# Patient Record
Sex: Female | Born: 1950 | Race: White | Hispanic: No | Smoking: Never smoker
Health system: Southern US, Community
[De-identification: ages and names within clinical notes are randomized; demographics above are authoritative.]

## PROBLEM LIST (undated history)

## (undated) DIAGNOSIS — G43909 Migraine, unspecified, not intractable, without status migrainosus: Secondary | ICD-10-CM

## (undated) DIAGNOSIS — R112 Nausea with vomiting, unspecified: Secondary | ICD-10-CM

## (undated) DIAGNOSIS — Z973 Presence of spectacles and contact lenses: Secondary | ICD-10-CM

## (undated) DIAGNOSIS — S21001A Unspecified open wound of right breast, initial encounter: Secondary | ICD-10-CM

## (undated) DIAGNOSIS — N200 Calculus of kidney: Secondary | ICD-10-CM

## (undated) DIAGNOSIS — F419 Anxiety disorder, unspecified: Secondary | ICD-10-CM

## (undated) DIAGNOSIS — H5213 Myopia, bilateral: Secondary | ICD-10-CM

## (undated) DIAGNOSIS — E890 Postprocedural hypothyroidism: Secondary | ICD-10-CM

## (undated) DIAGNOSIS — C50919 Malignant neoplasm of unspecified site of unspecified female breast: Secondary | ICD-10-CM

## (undated) DIAGNOSIS — F329 Major depressive disorder, single episode, unspecified: Secondary | ICD-10-CM

## (undated) DIAGNOSIS — Z87442 Personal history of urinary calculi: Secondary | ICD-10-CM

## (undated) DIAGNOSIS — K219 Gastro-esophageal reflux disease without esophagitis: Secondary | ICD-10-CM

## (undated) DIAGNOSIS — F32A Depression, unspecified: Secondary | ICD-10-CM

## (undated) DIAGNOSIS — Z9889 Other specified postprocedural states: Secondary | ICD-10-CM

## (undated) DIAGNOSIS — I10 Essential (primary) hypertension: Secondary | ICD-10-CM

## (undated) DIAGNOSIS — Z972 Presence of dental prosthetic device (complete) (partial): Secondary | ICD-10-CM

## (undated) DIAGNOSIS — M1990 Unspecified osteoarthritis, unspecified site: Secondary | ICD-10-CM

## (undated) HISTORY — PX: CARPAL TUNNEL RELEASE: SHX101

## (undated) HISTORY — PX: OTHER SURGICAL HISTORY: SHX169

## (undated) HISTORY — PX: REDUCTION MAMMAPLASTY: SUR839

## (undated) HISTORY — PX: RETINAL DETACHMENT SURGERY: SHX105

---

## 1970-01-25 HISTORY — PX: PARTIAL NEPHRECTOMY: SHX414

## 2005-01-25 HISTORY — PX: CHOLECYSTECTOMY: SHX55

## 2006-01-25 HISTORY — PX: LUMBAR FUSION: SHX111

## 2010-01-25 HISTORY — PX: THYROIDECTOMY: SHX17

## 2011-01-26 HISTORY — PX: SHOULDER ARTHROSCOPY: SHX128

## 2011-07-21 DIAGNOSIS — I10 Essential (primary) hypertension: Secondary | ICD-10-CM | POA: Insufficient documentation

## 2011-07-21 DIAGNOSIS — N200 Calculus of kidney: Secondary | ICD-10-CM | POA: Insufficient documentation

## 2011-07-21 DIAGNOSIS — K219 Gastro-esophageal reflux disease without esophagitis: Secondary | ICD-10-CM | POA: Insufficient documentation

## 2011-12-27 DIAGNOSIS — R82991 Hypocitraturia: Secondary | ICD-10-CM | POA: Insufficient documentation

## 2013-12-10 DIAGNOSIS — H5213 Myopia, bilateral: Secondary | ICD-10-CM | POA: Insufficient documentation

## 2013-12-10 DIAGNOSIS — H33001 Unspecified retinal detachment with retinal break, right eye: Secondary | ICD-10-CM | POA: Insufficient documentation

## 2015-04-14 ENCOUNTER — Other Ambulatory Visit: Payer: Self-pay | Admitting: *Deleted

## 2015-04-14 DIAGNOSIS — R928 Other abnormal and inconclusive findings on diagnostic imaging of breast: Secondary | ICD-10-CM

## 2015-04-22 ENCOUNTER — Ambulatory Visit
Admission: RE | Admit: 2015-04-22 | Discharge: 2015-04-22 | Disposition: A | Discharge status: Home / Self Care | Payer: BLUE CROSS/BLUE SHIELD | Source: Ambulatory Visit | Attending: *Deleted | Admitting: *Deleted

## 2015-04-22 ENCOUNTER — Other Ambulatory Visit: Payer: Self-pay | Admitting: *Deleted

## 2015-04-22 DIAGNOSIS — R928 Other abnormal and inconclusive findings on diagnostic imaging of breast: Secondary | ICD-10-CM

## 2015-05-01 ENCOUNTER — Other Ambulatory Visit: Payer: Self-pay | Admitting: General Surgery

## 2015-05-01 DIAGNOSIS — R928 Other abnormal and inconclusive findings on diagnostic imaging of breast: Secondary | ICD-10-CM

## 2015-05-05 ENCOUNTER — Ambulatory Visit
Admission: RE | Admit: 2015-05-05 | Discharge: 2015-05-05 | Disposition: A | Discharge status: Home / Self Care | Payer: BLUE CROSS/BLUE SHIELD | Source: Ambulatory Visit | Attending: General Surgery | Admitting: General Surgery

## 2015-05-05 ENCOUNTER — Ambulatory Visit
Admission: RE | Admit: 2015-05-05 | Discharge: 2015-05-05 | Disposition: A | Discharge status: Home / Self Care | Payer: BLUE CROSS/BLUE SHIELD | Source: Ambulatory Visit | Attending: *Deleted | Admitting: *Deleted

## 2015-05-05 DIAGNOSIS — R928 Other abnormal and inconclusive findings on diagnostic imaging of breast: Secondary | ICD-10-CM

## 2015-05-06 ENCOUNTER — Other Ambulatory Visit: Payer: Self-pay | Admitting: General Surgery

## 2015-05-06 DIAGNOSIS — C50912 Malignant neoplasm of unspecified site of left female breast: Secondary | ICD-10-CM

## 2015-05-13 ENCOUNTER — Ambulatory Visit
Admission: RE | Admit: 2015-05-13 | Discharge: 2015-05-13 | Disposition: A | Discharge status: Home / Self Care | Payer: BLUE CROSS/BLUE SHIELD | Source: Ambulatory Visit | Attending: General Surgery | Admitting: General Surgery

## 2015-05-13 ENCOUNTER — Ambulatory Visit: Payer: Self-pay | Admitting: General Surgery

## 2015-05-13 DIAGNOSIS — C50912 Malignant neoplasm of unspecified site of left female breast: Secondary | ICD-10-CM

## 2015-05-15 ENCOUNTER — Other Ambulatory Visit: Payer: Self-pay | Admitting: General Surgery

## 2015-05-15 DIAGNOSIS — C50912 Malignant neoplasm of unspecified site of left female breast: Secondary | ICD-10-CM

## 2015-05-23 ENCOUNTER — Other Ambulatory Visit: Payer: Self-pay | Admitting: General Surgery

## 2015-05-23 DIAGNOSIS — C50912 Malignant neoplasm of unspecified site of left female breast: Secondary | ICD-10-CM

## 2015-05-27 ENCOUNTER — Ambulatory Visit
Admission: RE | Admit: 2015-05-27 | Discharge: 2015-05-27 | Disposition: A | Discharge status: Home / Self Care | Payer: BLUE CROSS/BLUE SHIELD | Source: Ambulatory Visit | Attending: General Surgery | Admitting: General Surgery

## 2015-05-27 DIAGNOSIS — C50912 Malignant neoplasm of unspecified site of left female breast: Secondary | ICD-10-CM

## 2015-05-27 MED ORDER — GADOBENATE DIMEGLUMINE 529 MG/ML IV SOLN: 20.0000 mL | Freq: Once | INTRAVENOUS | Status: DC | PRN

## 2015-05-27 MED ORDER — GADOBENATE DIMEGLUMINE 529 MG/ML IV SOLN
20.0000 mL | Freq: Once | INTRAVENOUS | Status: AC | PRN
  Administered 2015-05-27: 20 mL via INTRAVENOUS

## 2015-05-28 ENCOUNTER — Other Ambulatory Visit: Payer: Self-pay | Admitting: General Surgery

## 2015-05-28 DIAGNOSIS — Z905 Acquired absence of kidney: Secondary | ICD-10-CM | POA: Insufficient documentation

## 2015-05-28 DIAGNOSIS — R928 Other abnormal and inconclusive findings on diagnostic imaging of breast: Secondary | ICD-10-CM

## 2015-05-28 DIAGNOSIS — Z87442 Personal history of urinary calculi: Secondary | ICD-10-CM | POA: Insufficient documentation

## 2015-05-28 DIAGNOSIS — C50912 Malignant neoplasm of unspecified site of left female breast: Secondary | ICD-10-CM

## 2015-06-03 ENCOUNTER — Ambulatory Visit
Admission: RE | Admit: 2015-06-03 | Discharge: 2015-06-03 | Disposition: A | Discharge status: Home / Self Care | Payer: BLUE CROSS/BLUE SHIELD | Source: Ambulatory Visit | Attending: General Surgery | Admitting: General Surgery

## 2015-06-03 DIAGNOSIS — C50912 Malignant neoplasm of unspecified site of left female breast: Secondary | ICD-10-CM

## 2015-06-03 DIAGNOSIS — R928 Other abnormal and inconclusive findings on diagnostic imaging of breast: Secondary | ICD-10-CM

## 2015-06-03 MED ORDER — GADOBENATE DIMEGLUMINE 529 MG/ML IV SOLN
20.0000 mL | Freq: Once | INTRAVENOUS | Status: AC | PRN
Start: 2015-06-03 — End: 2015-06-03
  Administered 2015-06-03: 20 mL via INTRAVENOUS

## 2015-06-05 ENCOUNTER — Encounter (HOSPITAL_BASED_OUTPATIENT_CLINIC_OR_DEPARTMENT_OTHER): Payer: Self-pay | Admitting: *Deleted

## 2015-06-06 ENCOUNTER — Ambulatory Visit: Payer: Self-pay | Admitting: General Surgery

## 2015-06-06 DIAGNOSIS — C50912 Malignant neoplasm of unspecified site of left female breast: Secondary | ICD-10-CM

## 2015-06-10 ENCOUNTER — Encounter (HOSPITAL_BASED_OUTPATIENT_CLINIC_OR_DEPARTMENT_OTHER): Payer: Self-pay | Admitting: Anesthesiology

## 2015-06-11 ENCOUNTER — Ambulatory Visit: Payer: Self-pay | Admitting: General Surgery

## 2015-06-11 DIAGNOSIS — C50912 Malignant neoplasm of unspecified site of left female breast: Secondary | ICD-10-CM

## 2015-06-11 DIAGNOSIS — C50412 Malignant neoplasm of upper-outer quadrant of left female breast: Secondary | ICD-10-CM | POA: Insufficient documentation

## 2015-06-12 ENCOUNTER — Ambulatory Visit (HOSPITAL_COMMUNITY): Payer: BLUE CROSS/BLUE SHIELD

## 2015-06-12 ENCOUNTER — Encounter (HOSPITAL_BASED_OUTPATIENT_CLINIC_OR_DEPARTMENT_OTHER): Payer: Self-pay | Admitting: *Deleted

## 2015-06-12 ENCOUNTER — Ambulatory Visit (HOSPITAL_BASED_OUTPATIENT_CLINIC_OR_DEPARTMENT_OTHER)
Admission: RE | Admit: 2015-06-12 | Payer: BLUE CROSS/BLUE SHIELD | Source: Ambulatory Visit | Admitting: General Surgery

## 2015-06-12 HISTORY — DX: Myopia, bilateral: H52.13

## 2015-06-12 HISTORY — DX: Nausea with vomiting, unspecified: R11.2

## 2015-06-12 HISTORY — DX: Essential (primary) hypertension: I10

## 2015-06-12 HISTORY — DX: Depression, unspecified: F32.A

## 2015-06-12 HISTORY — DX: Anxiety disorder, unspecified: F41.9

## 2015-06-12 HISTORY — DX: Calculus of kidney: N20.0

## 2015-06-12 HISTORY — DX: Presence of dental prosthetic device (complete) (partial): Z97.2

## 2015-06-12 HISTORY — DX: Gastro-esophageal reflux disease without esophagitis: K21.9

## 2015-06-12 HISTORY — DX: Major depressive disorder, single episode, unspecified: F32.9

## 2015-06-12 HISTORY — DX: Other specified postprocedural states: Z98.890

## 2015-06-12 HISTORY — DX: Migraine, unspecified, not intractable, without status migrainosus: G43.909

## 2015-06-12 SURGERY — MASTECTOMY WITH SENTINEL LYMPH NODE BIOPSY
Anesthesia: General | Site: Breast | Laterality: Left

## 2015-06-13 ENCOUNTER — Encounter (HOSPITAL_BASED_OUTPATIENT_CLINIC_OR_DEPARTMENT_OTHER)
Admission: RE | Admit: 2015-06-13 | Discharge: 2015-06-13 | Disposition: A | Discharge status: Home / Self Care | Payer: BLUE CROSS/BLUE SHIELD | Source: Ambulatory Visit

## 2015-06-13 ENCOUNTER — Ambulatory Visit (HOSPITAL_COMMUNITY)
Admission: RE | Admit: 2015-06-13 | Discharge: 2015-06-13 | Disposition: A | Discharge status: Home / Self Care | Payer: BLUE CROSS/BLUE SHIELD | Source: Ambulatory Visit | Attending: General Surgery | Admitting: General Surgery

## 2015-06-13 ENCOUNTER — Ambulatory Visit
Admission: RE | Admit: 2015-06-13 | Discharge: 2015-06-13 | Disposition: A | Discharge status: Home / Self Care | Payer: BLUE CROSS/BLUE SHIELD | Source: Ambulatory Visit | Attending: General Surgery | Admitting: General Surgery

## 2015-06-13 ENCOUNTER — Ambulatory Visit (HOSPITAL_COMMUNITY): Admission: RE | Admit: 2015-06-13 | Payer: BLUE CROSS/BLUE SHIELD | Source: Ambulatory Visit

## 2015-06-13 ENCOUNTER — Other Ambulatory Visit (HOSPITAL_COMMUNITY): Payer: Self-pay | Admitting: General Surgery

## 2015-06-13 ENCOUNTER — Other Ambulatory Visit: Payer: Self-pay

## 2015-06-13 ENCOUNTER — Other Ambulatory Visit: Payer: Self-pay | Admitting: General Surgery

## 2015-06-13 DIAGNOSIS — Z01811 Encounter for preprocedural respiratory examination: Secondary | ICD-10-CM

## 2015-06-13 DIAGNOSIS — C50912 Malignant neoplasm of unspecified site of left female breast: Secondary | ICD-10-CM | POA: Insufficient documentation

## 2015-06-13 DIAGNOSIS — Z01818 Encounter for other preprocedural examination: Secondary | ICD-10-CM | POA: Insufficient documentation

## 2015-06-13 DIAGNOSIS — R001 Bradycardia, unspecified: Secondary | ICD-10-CM | POA: Diagnosis not present

## 2015-06-13 DIAGNOSIS — Z01812 Encounter for preprocedural laboratory examination: Secondary | ICD-10-CM | POA: Insufficient documentation

## 2015-06-13 LAB — CBC WITH DIFFERENTIAL/PLATELET
BASOS PCT: 0 %
Basophils Absolute: 0 10*3/uL (ref 0.0–0.1)
EOS PCT: 3 %
Eosinophils Absolute: 0.2 10*3/uL (ref 0.0–0.7)
HCT: 42.6 % (ref 36.0–46.0)
Hemoglobin: 13.5 g/dL (ref 12.0–15.0)
Lymphocytes Relative: 38 %
Lymphs Abs: 3.1 10*3/uL (ref 0.7–4.0)
MCH: 26.1 pg (ref 26.0–34.0)
MCHC: 31.7 g/dL (ref 30.0–36.0)
MCV: 82.2 fL (ref 78.0–100.0)
MONO ABS: 0.5 10*3/uL (ref 0.1–1.0)
Monocytes Relative: 7 %
Neutro Abs: 4.2 10*3/uL (ref 1.7–7.7)
Neutrophils Relative %: 52 %
Platelets: 279 10*3/uL (ref 150–400)
RBC: 5.18 MIL/uL — AB (ref 3.87–5.11)
RDW: 13.2 % (ref 11.5–15.5)
WBC: 8.1 10*3/uL (ref 4.0–10.5)

## 2015-06-13 LAB — COMPREHENSIVE METABOLIC PANEL
ALBUMIN: 4.3 g/dL (ref 3.5–5.0)
ALK PHOS: 61 U/L (ref 38–126)
ALT: 17 U/L (ref 14–54)
AST: 16 U/L (ref 15–41)
Anion gap: 11 (ref 5–15)
BILIRUBIN TOTAL: 0.4 mg/dL (ref 0.3–1.2)
BUN: 11 mg/dL (ref 6–20)
CO2: 30 mmol/L (ref 22–32)
Calcium: 9.8 mg/dL (ref 8.9–10.3)
Chloride: 100 mmol/L — ABNORMAL LOW (ref 101–111)
Creatinine, Ser: 0.87 mg/dL (ref 0.44–1.00)
GFR calc Af Amer: >60 mL/min (ref >60)
GFR calc non Af Amer: >60 mL/min (ref >60)
GLUCOSE: 108 mg/dL — AB (ref 65–99)
POTASSIUM: 4.3 mmol/L (ref 3.5–5.1)
Sodium: 141 mmol/L (ref 135–145)
TOTAL PROTEIN: 7.4 g/dL (ref 6.5–8.1)

## 2015-06-17 ENCOUNTER — Ambulatory Visit
Admit: 2015-06-17 | Discharge: 2015-06-17 | Disposition: A | Discharge status: Home / Self Care | Payer: BLUE CROSS/BLUE SHIELD | Attending: General Surgery | Admitting: General Surgery

## 2015-06-17 ENCOUNTER — Ambulatory Visit (HOSPITAL_BASED_OUTPATIENT_CLINIC_OR_DEPARTMENT_OTHER): Payer: BLUE CROSS/BLUE SHIELD | Admitting: Anesthesiology

## 2015-06-17 ENCOUNTER — Ambulatory Visit
Admission: RE | Admit: 2015-06-17 | Discharge: 2015-06-17 | Disposition: A | Discharge status: Home / Self Care | Payer: BLUE CROSS/BLUE SHIELD | Source: Ambulatory Visit | Attending: General Surgery | Admitting: General Surgery

## 2015-06-17 ENCOUNTER — Other Ambulatory Visit: Payer: Self-pay | Admitting: Plastic Surgery

## 2015-06-17 ENCOUNTER — Other Ambulatory Visit: Payer: Self-pay | Admitting: General Surgery

## 2015-06-17 ENCOUNTER — Encounter (HOSPITAL_COMMUNITY)
Admission: RE | Admit: 2015-06-17 | Discharge: 2015-06-17 | Disposition: A | Discharge status: Home / Self Care | Payer: BLUE CROSS/BLUE SHIELD | Source: Ambulatory Visit | Attending: General Surgery | Admitting: General Surgery

## 2015-06-17 ENCOUNTER — Encounter: Payer: Self-pay | Admitting: Hematology & Oncology

## 2015-06-17 ENCOUNTER — Encounter (HOSPITAL_BASED_OUTPATIENT_CLINIC_OR_DEPARTMENT_OTHER): Payer: Self-pay | Admitting: Anesthesiology

## 2015-06-17 ENCOUNTER — Encounter (HOSPITAL_BASED_OUTPATIENT_CLINIC_OR_DEPARTMENT_OTHER)
Admission: RE | Disposition: A | Discharge status: Home / Self Care | Payer: Self-pay | Source: Ambulatory Visit | Attending: Plastic Surgery

## 2015-06-17 ENCOUNTER — Ambulatory Visit (HOSPITAL_BASED_OUTPATIENT_CLINIC_OR_DEPARTMENT_OTHER)
Admission: RE | Admit: 2015-06-17 | Discharge: 2015-06-18 | Disposition: A | Discharge status: Home / Self Care | Payer: BLUE CROSS/BLUE SHIELD | Source: Ambulatory Visit | Attending: Plastic Surgery | Admitting: Plastic Surgery

## 2015-06-17 ENCOUNTER — Ambulatory Visit: Admit: 2015-06-17 | Payer: BLUE CROSS/BLUE SHIELD

## 2015-06-17 DIAGNOSIS — C50912 Malignant neoplasm of unspecified site of left female breast: Secondary | ICD-10-CM | POA: Diagnosis present

## 2015-06-17 DIAGNOSIS — Z885 Allergy status to narcotic agent status: Secondary | ICD-10-CM | POA: Insufficient documentation

## 2015-06-17 DIAGNOSIS — Z905 Acquired absence of kidney: Secondary | ICD-10-CM | POA: Insufficient documentation

## 2015-06-17 DIAGNOSIS — E890 Postprocedural hypothyroidism: Secondary | ICD-10-CM | POA: Insufficient documentation

## 2015-06-17 DIAGNOSIS — Z6839 Body mass index (BMI) 39.0-39.9, adult: Secondary | ICD-10-CM | POA: Diagnosis not present

## 2015-06-17 DIAGNOSIS — I10 Essential (primary) hypertension: Secondary | ICD-10-CM | POA: Diagnosis not present

## 2015-06-17 DIAGNOSIS — M549 Dorsalgia, unspecified: Secondary | ICD-10-CM | POA: Insufficient documentation

## 2015-06-17 DIAGNOSIS — R51 Headache: Secondary | ICD-10-CM | POA: Diagnosis not present

## 2015-06-17 DIAGNOSIS — M542 Cervicalgia: Secondary | ICD-10-CM | POA: Insufficient documentation

## 2015-06-17 DIAGNOSIS — C50412 Malignant neoplasm of upper-outer quadrant of left female breast: Secondary | ICD-10-CM | POA: Diagnosis present

## 2015-06-17 DIAGNOSIS — Z170 Estrogen receptor positive status [ER+]: Secondary | ICD-10-CM | POA: Diagnosis not present

## 2015-06-17 DIAGNOSIS — K219 Gastro-esophageal reflux disease without esophagitis: Secondary | ICD-10-CM | POA: Diagnosis not present

## 2015-06-17 DIAGNOSIS — Z01818 Encounter for other preprocedural examination: Secondary | ICD-10-CM

## 2015-06-17 DIAGNOSIS — N6489 Other specified disorders of breast: Secondary | ICD-10-CM | POA: Diagnosis not present

## 2015-06-17 DIAGNOSIS — Z9049 Acquired absence of other specified parts of digestive tract: Secondary | ICD-10-CM | POA: Insufficient documentation

## 2015-06-17 DIAGNOSIS — F419 Anxiety disorder, unspecified: Secondary | ICD-10-CM | POA: Diagnosis not present

## 2015-06-17 DIAGNOSIS — Z87442 Personal history of urinary calculi: Secondary | ICD-10-CM | POA: Diagnosis not present

## 2015-06-17 DIAGNOSIS — D0502 Lobular carcinoma in situ of left breast: Secondary | ICD-10-CM | POA: Diagnosis not present

## 2015-06-17 DIAGNOSIS — G43909 Migraine, unspecified, not intractable, without status migrainosus: Secondary | ICD-10-CM | POA: Diagnosis not present

## 2015-06-17 DIAGNOSIS — C773 Secondary and unspecified malignant neoplasm of axilla and upper limb lymph nodes: Secondary | ICD-10-CM | POA: Diagnosis not present

## 2015-06-17 DIAGNOSIS — N62 Hypertrophy of breast: Secondary | ICD-10-CM | POA: Insufficient documentation

## 2015-06-17 DIAGNOSIS — Z79899 Other long term (current) drug therapy: Secondary | ICD-10-CM | POA: Insufficient documentation

## 2015-06-17 DIAGNOSIS — F329 Major depressive disorder, single episode, unspecified: Secondary | ICD-10-CM | POA: Diagnosis not present

## 2015-06-17 HISTORY — PX: BREAST REDUCTION SURGERY: SHX8

## 2015-06-17 HISTORY — PX: BREAST LUMPECTOMY WITH RADIOACTIVE SEED AND SENTINEL LYMPH NODE BIOPSY: SHX6550

## 2015-06-17 SURGERY — BREAST LUMPECTOMY WITH RADIOACTIVE SEED AND SENTINEL LYMPH NODE BIOPSY
Anesthesia: Regional | Site: Breast | Laterality: Left

## 2015-06-17 MED ORDER — KETOROLAC TROMETHAMINE 15 MG/ML IJ SOLN
15.0000 mg | Freq: Four times a day (QID) | INTRAMUSCULAR | Status: AC
  Administered 2015-06-17: 15 mg via INTRAVENOUS
  Filled 2015-06-17: qty 1

## 2015-06-17 MED ORDER — HYDROMORPHONE HCL 1 MG/ML IJ SOLN
0.2500 mg | INTRAMUSCULAR | Status: DC | PRN
  Administered 2015-06-17 (×2): 0.5 mg via INTRAVENOUS

## 2015-06-17 MED ORDER — ROCURONIUM BROMIDE 100 MG/10ML IV SOLN
INTRAVENOUS | Status: DC | PRN
  Administered 2015-06-17: 30 mg via INTRAVENOUS

## 2015-06-17 MED ORDER — SUCCINYLCHOLINE CHLORIDE 20 MG/ML IJ SOLN
INTRAMUSCULAR | Status: DC | PRN
  Administered 2015-06-17: 120 mg via INTRAVENOUS

## 2015-06-17 MED ORDER — FENTANYL CITRATE (PF) 100 MCG/2ML IJ SOLN
INTRAMUSCULAR | Status: AC
  Filled 2015-06-17: qty 2

## 2015-06-17 MED ORDER — CEFAZOLIN SODIUM-DEXTROSE 2-4 GM/100ML-% IV SOLN: 2.0000 g | INTRAVENOUS | Status: DC

## 2015-06-17 MED ORDER — LACTATED RINGERS IV SOLN
INTRAVENOUS | Status: DC
  Administered 2015-06-17 (×4): via INTRAVENOUS

## 2015-06-17 MED ORDER — METOCLOPRAMIDE HCL 5 MG/ML IJ SOLN: 10.0000 mg | Freq: Once | INTRAMUSCULAR | Status: DC | PRN

## 2015-06-17 MED ORDER — PHENYLEPHRINE 40 MCG/ML (10ML) SYRINGE FOR IV PUSH (FOR BLOOD PRESSURE SUPPORT)
PREFILLED_SYRINGE | INTRAVENOUS | Status: AC
  Filled 2015-06-17: qty 10

## 2015-06-17 MED ORDER — DEXAMETHASONE SODIUM PHOSPHATE 4 MG/ML IJ SOLN
INTRAMUSCULAR | Status: DC | PRN
  Administered 2015-06-17: 10 mg via INTRAVENOUS

## 2015-06-17 MED ORDER — PROPOFOL 10 MG/ML IV BOLUS
INTRAVENOUS | Status: DC | PRN
  Administered 2015-06-17: 200 mg via INTRAVENOUS

## 2015-06-17 MED ORDER — MEPERIDINE HCL 25 MG/ML IJ SOLN: 6.2500 mg | INTRAMUSCULAR | Status: DC | PRN

## 2015-06-17 MED ORDER — DIPHENHYDRAMINE HCL 12.5 MG/5ML PO ELIX: 12.5000 mg | ORAL_SOLUTION | Freq: Four times a day (QID) | ORAL | Status: DC | PRN

## 2015-06-17 MED ORDER — TECHNETIUM TC 99M SULFUR COLLOID FILTERED
1.0000 | Freq: Once | INTRAVENOUS | Status: AC | PRN
  Administered 2015-06-17: 1 via INTRADERMAL

## 2015-06-17 MED ORDER — SODIUM CHLORIDE 0.9 % IR SOLN
Status: DC | PRN
  Administered 2015-06-17: 500 mL

## 2015-06-17 MED ORDER — DIAZEPAM 2 MG PO TABS: 2.0000 mg | ORAL_TABLET | Freq: Two times a day (BID) | ORAL | Status: DC | PRN

## 2015-06-17 MED ORDER — HYDROMORPHONE HCL 1 MG/ML IJ SOLN
INTRAMUSCULAR | Status: AC
  Filled 2015-06-17: qty 1

## 2015-06-17 MED ORDER — ONDANSETRON HCL 4 MG/2ML IJ SOLN
4.0000 mg | Freq: Four times a day (QID) | INTRAMUSCULAR | Status: DC | PRN
  Administered 2015-06-18: 4 mg via INTRAVENOUS
  Filled 2015-06-17: qty 2

## 2015-06-17 MED ORDER — ONDANSETRON HCL 4 MG/2ML IJ SOLN
INTRAMUSCULAR | Status: AC
  Filled 2015-06-17: qty 2

## 2015-06-17 MED ORDER — CEFAZOLIN SODIUM-DEXTROSE 2-4 GM/100ML-% IV SOLN
INTRAVENOUS | Status: AC
  Filled 2015-06-17: qty 100

## 2015-06-17 MED ORDER — ARTIFICIAL TEARS OP OINT
TOPICAL_OINTMENT | OPHTHALMIC | Status: DC | PRN
  Administered 2015-06-17: 1 via OPHTHALMIC

## 2015-06-17 MED ORDER — MIDAZOLAM HCL 2 MG/2ML IJ SOLN
INTRAMUSCULAR | Status: AC
  Filled 2015-06-17: qty 2

## 2015-06-17 MED ORDER — HYDROCODONE-ACETAMINOPHEN 7.5-325 MG PO TABS: 1.0000 | ORAL_TABLET | Freq: Once | ORAL | Status: DC | PRN

## 2015-06-17 MED ORDER — NAPROXEN 500 MG PO TABS
500.0000 mg | ORAL_TABLET | Freq: Two times a day (BID) | ORAL | Status: DC | PRN
Start: 2015-06-17 — End: 2015-06-18

## 2015-06-17 MED ORDER — DEXTROSE 5 % IV SOLN
10.0000 mg | INTRAVENOUS | Status: DC | PRN
  Administered 2015-06-17: 10 ug/min via INTRAVENOUS

## 2015-06-17 MED ORDER — PANTOPRAZOLE SODIUM 40 MG PO TBEC: 40.0000 mg | DELAYED_RELEASE_TABLET | Freq: Every day | ORAL | Status: DC

## 2015-06-17 MED ORDER — PHENYLEPHRINE HCL 10 MG/ML IJ SOLN
INTRAMUSCULAR | Status: AC
  Filled 2015-06-17: qty 1

## 2015-06-17 MED ORDER — LEVOTHYROXINE SODIUM 112 MCG PO TABS: 112.0000 ug | ORAL_TABLET | Freq: Every day | ORAL | Status: DC

## 2015-06-17 MED ORDER — ARTIFICIAL TEARS OP OINT
TOPICAL_OINTMENT | OPHTHALMIC | Status: AC
  Filled 2015-06-17: qty 3.5

## 2015-06-17 MED ORDER — POLYETHYLENE GLYCOL 3350 17 G PO PACK: 17.0000 g | PACK | Freq: Every day | ORAL | Status: DC | PRN

## 2015-06-17 MED ORDER — ACETAMINOPHEN 500 MG PO TABS: 1000.0000 mg | ORAL_TABLET | Freq: Four times a day (QID) | ORAL | Status: DC

## 2015-06-17 MED ORDER — SUGAMMADEX SODIUM 200 MG/2ML IV SOLN
INTRAVENOUS | Status: AC
  Filled 2015-06-17: qty 2

## 2015-06-17 MED ORDER — FENTANYL CITRATE (PF) 100 MCG/2ML IJ SOLN
50.0000 ug | INTRAMUSCULAR | Status: AC | PRN
  Administered 2015-06-17 (×2): 50 ug via INTRAVENOUS
  Administered 2015-06-17: 25 ug via INTRAVENOUS
  Administered 2015-06-17: 50 ug via INTRAVENOUS
  Administered 2015-06-17: 25 ug via INTRAVENOUS
  Administered 2015-06-17 (×2): 50 ug via INTRAVENOUS

## 2015-06-17 MED ORDER — ATROPINE SULFATE 0.4 MG/ML IJ SOLN
INTRAMUSCULAR | Status: DC | PRN
  Administered 2015-06-17: 0.4 mg via INTRAVENOUS

## 2015-06-17 MED ORDER — SCOPOLAMINE 1 MG/3DAYS TD PT72: 1.0000 | MEDICATED_PATCH | Freq: Once | TRANSDERMAL | Status: DC | PRN

## 2015-06-17 MED ORDER — SCOPOLAMINE 1 MG/3DAYS TD PT72
1.0000 | MEDICATED_PATCH | TRANSDERMAL | Status: DC
  Administered 2015-06-17: 1.5 mg via TRANSDERMAL

## 2015-06-17 MED ORDER — SCOPOLAMINE 1 MG/3DAYS TD PT72
MEDICATED_PATCH | TRANSDERMAL | Status: AC
  Filled 2015-06-17: qty 1

## 2015-06-17 MED ORDER — ROPIVACAINE HCL 5 MG/ML IJ SOLN
INTRAMUSCULAR | Status: DC | PRN
  Administered 2015-06-17 (×2): 10 mL via PERINEURAL

## 2015-06-17 MED ORDER — ZOLPIDEM TARTRATE 10 MG PO TABS
10.0000 mg | ORAL_TABLET | Freq: Every evening | ORAL | Status: DC | PRN
  Administered 2015-06-17: 10 mg via ORAL

## 2015-06-17 MED ORDER — DEXAMETHASONE SODIUM PHOSPHATE 10 MG/ML IJ SOLN
INTRAMUSCULAR | Status: AC
  Filled 2015-06-17: qty 1

## 2015-06-17 MED ORDER — EPHEDRINE SULFATE 50 MG/ML IJ SOLN
INTRAMUSCULAR | Status: DC | PRN
Start: 2015-06-17 — End: 2015-06-17
  Administered 2015-06-17 (×2): 5 mg via INTRAVENOUS
  Administered 2015-06-17: 10 mg via INTRAVENOUS
  Administered 2015-06-17: 5 mg via INTRAVENOUS
  Administered 2015-06-17 (×2): 10 mg via INTRAVENOUS

## 2015-06-17 MED ORDER — KETOROLAC TROMETHAMINE 30 MG/ML IJ SOLN
INTRAMUSCULAR | Status: AC
  Filled 2015-06-17: qty 1

## 2015-06-17 MED ORDER — GLYCOPYRROLATE 0.2 MG/ML IJ SOLN
0.2000 mg | Freq: Once | INTRAMUSCULAR | Status: AC | PRN
  Administered 2015-06-17: 0.2 mg via INTRAVENOUS

## 2015-06-17 MED ORDER — TOPIRAMATE 100 MG PO TABS
200.0000 mg | ORAL_TABLET | Freq: Two times a day (BID) | ORAL | Status: DC
  Administered 2015-06-17: 200 mg via ORAL

## 2015-06-17 MED ORDER — ONDANSETRON HCL 4 MG/2ML IJ SOLN
INTRAMUSCULAR | Status: DC | PRN
  Administered 2015-06-17: 4 mg via INTRAVENOUS

## 2015-06-17 MED ORDER — FLUOXETINE HCL 20 MG PO CAPS: 20.0000 mg | ORAL_CAPSULE | Freq: Every day | ORAL | Status: DC

## 2015-06-17 MED ORDER — BUPIVACAINE-EPINEPHRINE 0.25% -1:200000 IJ SOLN
INTRAMUSCULAR | Status: DC | PRN
  Administered 2015-06-17: 20 mL

## 2015-06-17 MED ORDER — BUPIVACAINE-EPINEPHRINE (PF) 0.5% -1:200000 IJ SOLN
INTRAMUSCULAR | Status: DC | PRN
  Administered 2015-06-17: 10 mL via PERINEURAL
  Administered 2015-06-17: 15 mL via PERINEURAL

## 2015-06-17 MED ORDER — MIDAZOLAM HCL 2 MG/2ML IJ SOLN
1.0000 mg | INTRAMUSCULAR | Status: DC | PRN
  Administered 2015-06-17: 2 mg via INTRAVENOUS

## 2015-06-17 MED ORDER — LIDOCAINE 2% (20 MG/ML) 5 ML SYRINGE
INTRAMUSCULAR | Status: AC
  Filled 2015-06-17: qty 5

## 2015-06-17 MED ORDER — METOPROLOL SUCCINATE ER 50 MG PO TB24: 50.0000 mg | ORAL_TABLET | Freq: Every day | ORAL | Status: DC

## 2015-06-17 MED ORDER — LIDOCAINE 2% (20 MG/ML) 5 ML SYRINGE
INTRAMUSCULAR | Status: DC | PRN
  Administered 2015-06-17: 60 mg via INTRAVENOUS

## 2015-06-17 MED ORDER — KCL IN DEXTROSE-NACL 20-5-0.45 MEQ/L-%-% IV SOLN
INTRAVENOUS | Status: DC
  Administered 2015-06-17: 20:00:00 via INTRAVENOUS
  Filled 2015-06-17: qty 1000

## 2015-06-17 MED ORDER — CEFAZOLIN SODIUM-DEXTROSE 2-4 GM/100ML-% IV SOLN
2.0000 g | INTRAVENOUS | Status: AC
  Administered 2015-06-17: 2 g via INTRAVENOUS

## 2015-06-17 MED ORDER — CHLORHEXIDINE GLUCONATE 4 % EX LIQD: 1.0000 "application " | Freq: Once | CUTANEOUS | Status: DC

## 2015-06-17 MED ORDER — HYDROMORPHONE HCL 1 MG/ML IJ SOLN
1.0000 mg | INTRAMUSCULAR | Status: DC | PRN
  Administered 2015-06-18: 1 mg via INTRAVENOUS
  Filled 2015-06-17: qty 1

## 2015-06-17 MED ORDER — SODIUM CHLORIDE 0.9 % IJ SOLN
INTRAVENOUS | Status: DC | PRN
  Administered 2015-06-17: 5 mL via INTRAMUSCULAR

## 2015-06-17 MED ORDER — KETOROLAC TROMETHAMINE 15 MG/ML IJ SOLN
15.0000 mg | Freq: Four times a day (QID) | INTRAMUSCULAR | Status: DC | PRN
  Administered 2015-06-17: 15 mg via INTRAVENOUS
  Filled 2015-06-17: qty 1

## 2015-06-17 MED ORDER — EPHEDRINE 5 MG/ML INJ
INTRAVENOUS | Status: AC
  Filled 2015-06-17: qty 10

## 2015-06-17 MED ORDER — DIPHENHYDRAMINE HCL 50 MG/ML IJ SOLN: 12.5000 mg | Freq: Four times a day (QID) | INTRAMUSCULAR | Status: DC | PRN

## 2015-06-17 MED ORDER — GLYCOPYRROLATE 0.2 MG/ML IV SOSY
PREFILLED_SYRINGE | INTRAVENOUS | Status: AC
  Filled 2015-06-17: qty 3

## 2015-06-17 MED ORDER — CEFAZOLIN SODIUM-DEXTROSE 2-4 GM/100ML-% IV SOLN
2.0000 g | Freq: Three times a day (TID) | INTRAVENOUS | Status: DC
  Administered 2015-06-17 – 2015-06-18 (×2): 2 g via INTRAVENOUS
  Filled 2015-06-17 (×2): qty 100

## 2015-06-17 MED ORDER — HYDROCODONE-ACETAMINOPHEN 5-325 MG PO TABS
1.0000 | ORAL_TABLET | ORAL | Status: DC | PRN
Start: 2015-06-17 — End: 2015-06-18
  Administered 2015-06-17: 1 via ORAL
  Administered 2015-06-18: 2 via ORAL
  Filled 2015-06-17: qty 1
  Filled 2015-06-17: qty 2

## 2015-06-17 MED ORDER — DOCUSATE SODIUM 100 MG PO CAPS
100.0000 mg | ORAL_CAPSULE | Freq: Two times a day (BID) | ORAL | Status: DC
  Filled 2015-06-17: qty 1

## 2015-06-17 MED ORDER — PROPOFOL 10 MG/ML IV BOLUS
INTRAVENOUS | Status: AC
  Filled 2015-06-17: qty 20

## 2015-06-17 MED ORDER — ETODOLAC 400 MG PO TABS: 400.0000 mg | ORAL_TABLET | Freq: Two times a day (BID) | ORAL | Status: DC

## 2015-06-17 MED ORDER — ONDANSETRON 4 MG PO TBDP: 4.0000 mg | ORAL_TABLET | Freq: Four times a day (QID) | ORAL | Status: DC | PRN

## 2015-06-17 SURGICAL SUPPLY — 88 items
APPLIER CLIP 9.375 MED OPEN (MISCELLANEOUS) ×3
BAG DECANTER FOR FLEXI CONT (MISCELLANEOUS) ×3 IMPLANT
BINDER BREAST 3XL (BIND) ×3 IMPLANT
BIOPATCH RED 1 DISK 7.0 (GAUZE/BANDAGES/DRESSINGS) ×6 IMPLANT
BLADE HEX COATED 2.75 (ELECTRODE) ×6 IMPLANT
BLADE KNIFE PERSONA 10 (BLADE) ×15 IMPLANT
BLADE SURG 15 STRL LF DISP TIS (BLADE) ×4 IMPLANT
BLADE SURG 15 STRL SS (BLADE) ×2
BNDG GAUZE ELAST 4 BULKY (GAUZE/BANDAGES/DRESSINGS) ×6 IMPLANT
CANISTER SUCT 1200ML W/VALVE (MISCELLANEOUS) ×3 IMPLANT
CATH ROBINSON RED A/P 14FR (CATHETERS) ×3 IMPLANT
CHLORAPREP W/TINT 26ML (MISCELLANEOUS) ×9 IMPLANT
CLEANER CAUTERY TIP 5X5 PAD (MISCELLANEOUS) ×2 IMPLANT
CLIP APPLIE 9.375 MED OPEN (MISCELLANEOUS) ×2 IMPLANT
COVER BACK TABLE 60X90IN (DRAPES) ×6 IMPLANT
COVER MAYO STAND STRL (DRAPES) ×6 IMPLANT
COVER PROBE W GEL 5X96 (DRAPES) ×3 IMPLANT
DERMABOND ADVANCED (GAUZE/BANDAGES/DRESSINGS) ×5
DERMABOND ADVANCED .7 DNX12 (GAUZE/BANDAGES/DRESSINGS) ×10 IMPLANT
DEVICE DUBIN W/COMP PLATE 8390 (MISCELLANEOUS) ×3 IMPLANT
DRAIN CHANNEL 19F RND (DRAIN) ×6 IMPLANT
DRAPE LAPAROSCOPIC ABDOMINAL (DRAPES) IMPLANT
DRAPE UTILITY XL STRL (DRAPES) ×3 IMPLANT
DRSG PAD ABDOMINAL 8X10 ST (GAUZE/BANDAGES/DRESSINGS) ×12 IMPLANT
DRSG TEGADERM 4X4.75 (GAUZE/BANDAGES/DRESSINGS) IMPLANT
ELECT REM PT RETURN 9FT ADLT (ELECTROSURGICAL) ×3
ELECTRODE REM PT RTRN 9FT ADLT (ELECTROSURGICAL) ×2 IMPLANT
EVACUATOR SILICONE 100CC (DRAIN) ×6 IMPLANT
GLOVE BIO SURGEON STRL SZ 6.5 (GLOVE) ×15 IMPLANT
GLOVE BIOGEL PI IND STRL 8 (GLOVE) ×8 IMPLANT
GLOVE BIOGEL PI INDICATOR 8 (GLOVE) ×4
GLOVE ECLIPSE 7.5 STRL STRAW (GLOVE) ×6 IMPLANT
GLOVE EXAM NITRILE EXT CUFF MD (GLOVE) ×3 IMPLANT
GLOVE SURG SS PI 7.0 STRL IVOR (GLOVE) ×6 IMPLANT
GLOVE SURG SS PI 7.5 STRL IVOR (GLOVE) ×3 IMPLANT
GLOVE SURG SS PI 8.0 STRL IVOR (GLOVE) ×3 IMPLANT
GOWN STRL REUS W/ TWL LRG LVL3 (GOWN DISPOSABLE) ×6 IMPLANT
GOWN STRL REUS W/ TWL XL LVL3 (GOWN DISPOSABLE) ×2 IMPLANT
GOWN STRL REUS W/TWL LRG LVL3 (GOWN DISPOSABLE) ×3
GOWN STRL REUS W/TWL XL LVL3 (GOWN DISPOSABLE) ×1
KIT MARKER MARGIN INK (KITS) ×3 IMPLANT
LIGHT WAVEGUIDE WIDE FLAT (MISCELLANEOUS) IMPLANT
LIQUID BAND (GAUZE/BANDAGES/DRESSINGS) ×6 IMPLANT
NDL SAFETY ECLIPSE 18X1.5 (NEEDLE) ×2 IMPLANT
NEEDLE FILTER BLUNT 18X 1/2SAF (NEEDLE) ×1
NEEDLE FILTER BLUNT 18X1 1/2 (NEEDLE) ×2 IMPLANT
NEEDLE HYPO 18GX1.5 SHARP (NEEDLE) ×1
NEEDLE HYPO 25X1 1.5 SAFETY (NEEDLE) ×6 IMPLANT
NS IRRIG 1000ML POUR BTL (IV SOLUTION) ×3 IMPLANT
PACK BASIN DAY SURGERY FS (CUSTOM PROCEDURE TRAY) ×6 IMPLANT
PACK UNIVERSAL I (CUSTOM PROCEDURE TRAY) ×3 IMPLANT
PAD ALCOHOL SWAB (MISCELLANEOUS) ×3 IMPLANT
PAD CLEANER CAUTERY TIP 5X5 (MISCELLANEOUS) ×1
PENCIL BUTTON HOLSTER BLD 10FT (ELECTRODE) ×6 IMPLANT
PIN SAFETY STERILE (MISCELLANEOUS) ×3 IMPLANT
SLEEVE SCD COMPRESS KNEE MED (MISCELLANEOUS) ×3 IMPLANT
SPONGE LAP 18X18 X RAY DECT (DISPOSABLE) ×27 IMPLANT
SPONGE LAP 4X18 X RAY DECT (DISPOSABLE) ×3 IMPLANT
STRIP CLOSURE SKIN 1/4X4 (GAUZE/BANDAGES/DRESSINGS) IMPLANT
STRIP SUTURE WOUND CLOSURE 1/2 (SUTURE) ×6 IMPLANT
SUT MNCRL AB 4-0 PS2 18 (SUTURE) ×24 IMPLANT
SUT MON AB 3-0 SH 27 (SUTURE) ×7
SUT MON AB 3-0 SH27 (SUTURE) ×14 IMPLANT
SUT MON AB 4-0 PC3 18 (SUTURE) IMPLANT
SUT MON AB 5-0 PS2 18 (SUTURE) ×21 IMPLANT
SUT PDS 3-0 CT2 (SUTURE)
SUT PDS AB 2-0 CT2 27 (SUTURE) IMPLANT
SUT PDS II 3-0 CT2 27 ABS (SUTURE) IMPLANT
SUT PROLENE 4 0 PS 2 18 (SUTURE) IMPLANT
SUT SILK 2 0 SH (SUTURE) ×3 IMPLANT
SUT SILK 3 0 PS 1 (SUTURE) ×6 IMPLANT
SUT SILK 3 0 SH 30 (SUTURE) ×3 IMPLANT
SUT VIC AB 3-0 54X BRD REEL (SUTURE) IMPLANT
SUT VIC AB 3-0 BRD 54 (SUTURE)
SUT VIC AB 3-0 SH 27 (SUTURE)
SUT VIC AB 3-0 SH 27X BRD (SUTURE) IMPLANT
SUT VIC AB 4-0 SH 27 (SUTURE)
SUT VIC AB 4-0 SH 27XANBCTRL (SUTURE) IMPLANT
SUT VICRYL 4-0 PS2 18IN ABS (SUTURE) IMPLANT
SYR 3ML 23GX1 SAFETY (SYRINGE) ×3 IMPLANT
SYR BULB IRRIGATION 50ML (SYRINGE) ×3 IMPLANT
SYR CONTROL 10ML LL (SYRINGE) ×6 IMPLANT
TAPE MEASURE VINYL STERILE (MISCELLANEOUS) ×3 IMPLANT
TOWEL OR 17X24 6PK STRL BLUE (TOWEL DISPOSABLE) ×12 IMPLANT
TRAY DSU PREP LF (CUSTOM PROCEDURE TRAY) ×3 IMPLANT
TUBE CONNECTING 20X1/4 (TUBING) ×3 IMPLANT
UNDERPAD 30X30 (UNDERPADS AND DIAPERS) ×6 IMPLANT
YANKAUER SUCT BULB TIP NO VENT (SUCTIONS) ×3 IMPLANT

## 2015-06-17 NOTE — Progress Notes (Signed)
Assisted Dr. Foster with right and left, ultrasound guided, pectoralis blocks. Side rails up, monitors on throughout procedure. See vital signs in flow sheet. Tolerated Procedure well. 

## 2015-06-17 NOTE — Anesthesia Preprocedure Evaluation (Addendum)
Anesthesia Evaluation    Reviewed: Allergy & Precautions, NPO status , Patient's Chart, lab work & pertinent test results, reviewed documented beta blocker date and time   History of Anesthesia Complications (+) PONV and history of anesthetic complications  Airway Mallampati: III  TM Distance: >3 FB Neck ROM: Full    Dental  (+) Teeth Intact, Loose,    Pulmonary neg pulmonary ROS,    Pulmonary exam normal breath sounds clear to auscultation       Cardiovascular hypertension, Pt. on medications and Pt. on home beta blockers Normal cardiovascular exam Rhythm:Regular Rate:Normal     Neuro/Psych  Headaches, PSYCHIATRIC DISORDERS Anxiety Depression    GI/Hepatic Neg liver ROS, GERD  Medicated and Controlled,  Endo/Other  Hypothyroidism Morbid obesity  Renal/GU Renal diseaseNephrolithiasis  negative genitourinary   Musculoskeletal negative musculoskeletal ROS (+)   Abdominal (+) + obese,   Peds  Hematology negative hematology ROS (+)   Anesthesia Other Findings   Reproductive/Obstetrics                           Anesthesia Physical Anesthesia Plan  ASA: III  Anesthesia Plan: General and Regional   Post-op Pain Management: GA combined w/ Regional for post-op pain   Induction: Intravenous and Cricoid pressure planned  Airway Management Planned: Oral ETT  Additional Equipment:   Intra-op Plan:   Post-operative Plan: Extubation in OR  Informed Consent: I have reviewed the patients History and Physical, chart, labs and discussed the procedure including the risks, benefits and alternatives for the proposed anesthesia with the patient or authorized representative who has indicated his/her understanding and acceptance.   Dental advisory given  Plan Discussed with: CRNA, Anesthesiologist and Surgeon  Anesthesia Plan Comments: (Bilateral Pectoralis blocks for post op pain.)        Anesthesia Quick Evaluation

## 2015-06-17 NOTE — Progress Notes (Signed)
nuc med staff performed injection. No additional sedation required. Pt tol well. Family called to bedside, updated, emot support provided

## 2015-06-17 NOTE — Op Note (Signed)
OPERATIVE REPORT  DATE OF OPERATION: 06/17/2015  PATIENT:  Colleen Cohen  65 y.o. 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  Seed localization adequate.  Seed obtained and sent to pathology  PROCEDURE:  Procedure(s): LEFT BREAST LUMPECTOMY WITH RADIOACTIVE SEED AND WIRE LOCALIZATION, AND LYMPH NODE BIOPSY/AXILLARY TISSUE RESECTION,, NEEDLE LOCALIZATION IN ADH AREA OF LEFT BREAST BILATERAL BREAST  REDUCTION  WITH RECONSTRUCTION  ON LEFT  AFTER PARTIAL MASTECTOMY  SURGEON:  Surgeon(s): Loel Lofty Dillingham, DO Judeth Horn, MD Fanny Skates, MD  ASSISTANT: Dalbert Batman and Dillingham  ANESTHESIA:   general  COMPLICATIONS:  None  EBL: 50 ml  BLOOD ADMINISTERED: none  DRAINS: (2) Blake drain(s) in the breast flaps bilaterally   SPECIMEN:  Source of Specimen:  Left breast seed localization with confirmed seed in specimen and wire localization x 2 in specimen.  Long silk suture marks the distal extent of the deepest wire localization at the site of the atypical ductal hyperplasia  COUNTS CORRECT:  YES  PROCEDURE DETAILS: The patient was taken to the operating room and placed on the table in supine position. She was scheduled for bilateral breast reductions along with a seed localization lumpectomy, wire localization lumpectomy x 2 on the left side, and a sentinel lymph node biopsy. She had been injected preoperatively with the seed in the area of her lobular cancer in the upper outer quadrant of her left breast. She had also been injected with technetium in the subareolar arealwith a sentinel lymph node biopsy in the subcutaneous area overlying area.  She is placed on the table in supine position her arms were 90 to body exposing bilateral axilla. After an adequate endotracheal anesthetic was administered she was injected  with methylene blue in the subareolar area on the left side and massaged for approximately 3 minutes. She was then prepped and draped in usual sterile manner exposing both breasts.  A proper timeout was performed identifying the patient and the procedures to be performed. She had been marked for breast reductions by the plastic surgeon who will dictate that portion of the operation separately. We started on the left side and a circular periareolar incision was made. Skin flaps were dissected free as dictated by the plastic surgeon. Both wires exited laterally on the breast.  The anterior wire extended into the deep lesion which was atypical ductal hyperplasia., and the second posterior wire extended into  the second focus of lobular invasive cancer.  Using the neoprobe detector we were able to localize the seed on the skin level and angled our dissection using electrocautery to stay away from the margins of the seed. There is a large flap of tissue to be taken laterally for the breast reduction which included the area of the seed, the wire localization of the lobular carcinoma, and the area of atypical ductal hyperplasia. 2 clips were placed on the area where the atypical ductal hyperplasia originated in the wire came breast close to the chest wall. We made sure we had wide margins and away from the area detection of the seed. We excised an area of breast tissue that measured approximately 590 g in weight. Using the Faxitron were able to confirm the presence of the seed, both markers, and the wires indicating that the lesions were biopsied.  The seed was confirmed in pathology and we subsequently went on to perform a  localization using the neoprobe to detect technetium. Unfortunately we were never able to find adequate counts in the axilla; however, we did take 2 portions of axillary fatty tissue and lymphatic tissue and sent them as separate specimens. There was no blue dye localization and minimal counts of  technetium localization. There were some counts and the specimen sent but not high activity.  Electrocautery was obtained and subsequent flap reconstruction and reduction mammoplasty was performed by the plastic surgeon. Drains were left bilaterally. In pathology is pending.  All counts were correct at the conclusion of this portion of the procedure and further counts will be confirm by the plastic surgeon who continues to operate on the right-sided reduction mammoplasty at this time.  PATIENT DISPOSITION:  PACU - hemodynamically stable.   [name] 5/23/20174:20 PM

## 2015-06-17 NOTE — Anesthesia Procedure Notes (Addendum)
Anesthesia Regional Block:  Pectoralis block  Pre-Anesthetic Checklist: ,, timeout performed, Correct Patient, Correct Site, Correct Laterality, Correct Procedure, Correct Position, site marked, Risks and benefits discussed,  Surgical consent,  Pre-op evaluation,  At surgeon's request and post-op pain management  Laterality: Left  Prep: chloraprep       Needles:  Injection technique: Single-shot  Needle Type: Echogenic Stimulator Needle     Needle Length: 9cm 9 cm Needle Gauge: 21 and 21 G  Needle insertion depth: 7 cm   Additional Needles:  Procedures: ultrasound guided (picture in chart) Pectoralis block Narrative:  Start time: 06/17/2015 11:15 AM End time: 06/17/2015 11:20 AM Injection made incrementally with aspirations every 5 mL.  Performed by: Personally  Anesthesiologist: Josephine Igo  Additional Notes: Relevant anatomy Id'd.Incremental 30ml injection with frequent aspiration.Patient tolerated procedure well.    Anesthesia Regional Block:  Pectoralis block  Pre-Anesthetic Checklist: ,, timeout performed, Correct Patient, Correct Site, Correct Laterality, Correct Procedure, Correct Position, site marked, Risks and benefits discussed,  Surgical consent,  Pre-op evaluation,  At surgeon's request and post-op pain management  Laterality: Right  Prep: chloraprep       Needles:  Injection technique: Single-shot  Needle Type: Echogenic Stimulator Needle     Needle Length: 9cm 9 cm Needle Gauge: 21 and 21 G  Needle insertion depth: 7 cm   Additional Needles:  Procedures: ultrasound guided (picture in chart) Pectoralis block Narrative:  Start time: 06/17/2015 11:25 AM End time: 06/17/2015 11:30 AM Injection made incrementally with aspirations every 5 mL.  Performed by: Personally  Anesthesiologist: Josephine Igo  Additional Notes: Relevant anatomy Id'd. Incremental injection 50ml with frequent aspiration. Patient tolerated procedure well.     Procedure Name: Intubation Date/Time: 06/17/2015 1:20 PM Performed by: Baxter Flattery Pre-anesthesia Checklist: Patient identified, Emergency Drugs available, Suction available and Patient being monitored Patient Re-evaluated:Patient Re-evaluated prior to inductionOxygen Delivery Method: Circle System Utilized Preoxygenation: Pre-oxygenation with 100% oxygen Intubation Type: IV induction Ventilation: Mask ventilation without difficulty Laryngoscope Size: Miller and 2 Grade View: Grade II Tube type: Oral Tube size: 7.0 mm Number of attempts: 1 Airway Equipment and Method: Stylet and Bite block Placement Confirmation: ETT inserted through vocal cords under direct vision,  positive ETCO2 and breath sounds checked- equal and bilateral Secured at: 21 cm Tube secured with: Tape Dental Injury: Teeth and Oropharynx as per pre-operative assessment      Right Pectoralis Block Left Pectoralis Block

## 2015-06-17 NOTE — H&P (Signed)
Colleen Cohen is an 65 y.o. female.   Chief Complaint: Left breast  Lobular cancer with ADH HPI: Initiallly saw me about two months ago.  Workup has led to the finding of left breast lobular caner foci x 2 near each other in the upper outer portion of the left breast.  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  She is getting a seed lumpectomy and wire loc lumpectomy as one specimen in the UOQ, and a reduction resection of the ADH on the same side.  Past Medical History  Diagnosis Date  . Hypertension   . GERD (gastroesophageal reflux disease)   . Anxiety   . Depression   . Hypothyroidism   . Nephrolithiasis   . Myopia of both eyes   . PONV (postoperative nausea and vomiting)   . Migraine   . Wears dentures     Past Surgical History  Procedure Laterality Date  . Cholecystectomy    . Back surgery    . Thyroidectomy    . Partial nephrectomy    . Shoulder arthroscopy    . Cystoscopy w/ ureteral stent placement      History reviewed. No pertinent family history. Social History:  reports that she has never smoked. She has never used smokeless tobacco. She reports that she drinks alcohol. She reports that she does not use illicit drugs.  Allergies:  Allergies  Allergen Reactions  . Codeine Rash    Medications Prior to Admission  Medication Sig Dispense Refill  . etodolac (LODINE) 400 MG tablet Take 400 mg by mouth 2 (two) times daily.    Marland Kitchen FLUoxetine (PROZAC) 20 MG capsule Take 20 mg by mouth daily.    Marland Kitchen levothyroxine (SYNTHROID, LEVOTHROID) 112 MCG tablet Take 112 mcg by mouth daily before breakfast.    . metoprolol succinate (TOPROL-XL) 50 MG 24 hr tablet Take 50 mg by mouth daily. Take with or immediately following a meal.    . pantoprazole (PROTONIX) 40 MG tablet Take 40 mg by mouth daily.    Marland Kitchen topiramate (TOPAMAX) 200 MG tablet Take 200 mg by mouth 2 (two) times daily.    Marland Kitchen zolpidem (AMBIEN) 10 MG tablet Take 10 mg by mouth at bedtime as needed for sleep.       No results found for this or any previous visit (from the past 48 hour(s)). Nm Sentinel Node Inj-no Rpt (breast)  06/17/2015  CLINICAL DATA: Left Breast Cancer Sulfur colloid was injected intradermally by the nuclear medicine technologist for breast cancer sentinel node localization.    ROS  Blood pressure 118/52, pulse 70, temperature 97.8 F (36.6 C), temperature source Oral, resp. rate 15, height 5\' 7"  (1.702 m), weight 115.214 kg (254 lb), SpO2 100 %. Physical Exam  Constitutional: She is oriented to person, place, and time. She appears well-developed and well-nourished.  HENT:  Head: Normocephalic and atraumatic.  Eyes: EOM are normal. Pupils are equal, round, and reactive to light.  Neck: Normal range of motion.  Cardiovascular: Normal rate.   Respiratory: Effort normal.  Large breast bilaterally  GI: Soft. Bowel sounds are normal.  Musculoskeletal: Normal range of motion.  Neurological: She is alert and oriented to person, place, and time.  Skin: Skin is warm and dry.  Psychiatric: She has a normal mood and affect. Her behavior is normal. Judgment and thought content normal.     Assessment/Plan Breast cancer, invasive lobular in two foci left breast close to each other with ADH in a more distant site,  to be excised with a reduction mammoplasty.  All areas have been marked and a seed has been placed at the cancer site.  Judeth Horn, MD 06/17/2015, 12:52 PM

## 2015-06-17 NOTE — Brief Op Note (Signed)
06/17/2015  6:35 PM  PATIENT:  Colleen Cohen  65 y.o. female  PRE-OPERATIVE DIAGNOSIS:  Left Breast Cancer  POST-OPERATIVE DIAGNOSIS:  Left Breast Cancer  PROCEDURE:  Procedure(s): LEFT BREAST LUMPECTOMY WITH RADIOACTIVE SEED AND SENTINEL LYMPH NODE BIOPSY, NEEDLE LOCALIZATION IN ATYPICAL DUCTAL HYPERPLASIA  AREA OF LEFT BREAST (Left) BILATERAL BREAST  REDUCTION  WITH RECONSTRUCTION  ON LEFT  AFTER PARTIAL MASTECTOMY (Bilateral)  SURGEON:  Surgeon(s) and Role: Panel 1:    * Judeth Horn, MD - Primary    * Fanny Skates, MD - Assisting  Panel 2:    * Loel Lofty Eliya Bubar, DO - Primary  PHYSICIAN ASSISTANT: none  ASSISTANTS:  Dr. Judeth Horn  ANESTHESIA:   general  EBL:  Total I/O In: 2000 [I.V.:2000] Out: 175 [Blood:175]  BLOOD ADMINISTERED:none  DRAINS: (2) Jackson-Pratt drain(s) with closed bulb suction in the breast pocket (one on each side)   LOCAL MEDICATIONS USED:  MARCAINE     SPECIMEN:  Source of Specimen:  breast tissue  DISPOSITION OF SPECIMEN:  PATHOLOGY  COUNTS:  YES  TOURNIQUET:  * No tourniquets in log *  DICTATION: .Dragon Dictation  PLAN OF CARE: Admit for overnight observation  PATIENT DISPOSITION:  PACU - hemodynamically stable.   Delay start of Pharmacological VTE agent (>24hrs) due to surgical blood loss or risk of bleeding: no

## 2015-06-17 NOTE — Transfer of Care (Signed)
Immediate Anesthesia Transfer of Care Note  Patient: Colleen Cohen  Procedure(s) Performed: Procedure(s): LEFT BREAST LUMPECTOMY WITH RADIOACTIVE SEED AND SENTINEL LYMPH NODE BIOPSY, NEEDLE LOCALIZATION IN ATYPICAL DUCTAL HYPERPLASIA  AREA OF LEFT BREAST (Left) BILATERAL BREAST  REDUCTION  WITH RECONSTRUCTION  ON LEFT  AFTER PARTIAL MASTECTOMY (Bilateral)  Patient Location: PACU  Anesthesia Type:GA combined with regional for post-op pain  Level of Consciousness: sedated  Airway & Oxygen Therapy: Patient Spontanous Breathing and Patient connected to face mask oxygen  Post-op Assessment: Report given to RN and Post -op Vital signs reviewed and stable  Post vital signs: Reviewed and stable  Last Vitals:  Filed Vitals:   06/17/15 1852 06/17/15 1854  BP:    Pulse: 93 89  Temp:    Resp: 20 15    Last Pain: There were no vitals filed for this visit.       Complications: No apparent anesthesia complications

## 2015-06-17 NOTE — Anesthesia Postprocedure Evaluation (Signed)
Anesthesia Post Note  Patient: Colleen Cohen  Procedure(s) Performed: Procedure(s) (LRB): LEFT BREAST LUMPECTOMY WITH RADIOACTIVE SEED AND SENTINEL LYMPH NODE BIOPSY, NEEDLE LOCALIZATION IN ATYPICAL DUCTAL HYPERPLASIA  AREA OF LEFT BREAST (Left) BILATERAL BREAST  REDUCTION  WITH RECONSTRUCTION  ON LEFT  AFTER PARTIAL MASTECTOMY (Bilateral)  Patient location during evaluation: PACU Anesthesia Type: General and Regional Level of consciousness: awake and alert and oriented Pain management: pain level controlled Vital Signs Assessment: post-procedure vital signs reviewed and stable Respiratory status: spontaneous breathing, nonlabored ventilation and respiratory function stable Cardiovascular status: blood pressure returned to baseline and stable Postop Assessment: no signs of nausea or vomiting Anesthetic complications: no    Last Vitals:  Filed Vitals:   06/17/15 1915 06/17/15 1916  BP:  107/49  Pulse: 87 85  Temp:    Resp: 18 13    Last Pain:  Filed Vitals:   06/17/15 1920  PainSc: 9                  Alayza Pieper A.

## 2015-06-17 NOTE — Op Note (Signed)
Breast Reduction / Breast Reconstruction Op note:    DATE OF PROCEDURE: 06/17/2015  LOCATION: [facility]  SURGEON: Lyndee Leo Sanger Dillingham, DO  ASSISTANT: Dr. Judeth Horn  PREOPERATIVE DIAGNOSIS 1.Left breast cancer 2. Breast asymmetry after left breast cancer treatment 3. Macromastia 4. Neck Pain / Back Pain   POSTOPERATIVE DIAGNOSIS 1. Left breast cancer 2. Breast asymmetry after left breast cancer treatment 3. Macromastia 4. Neck Pain / Back Pain   PROCEDURES 1. Bilateral breast reduction.  Right reduction 1720g, Left reduction 99991111  COMPLICATIONS: None.  DRAINS: 2 one on each side in the breast pocket  INDICATIONS FOR PROCEDURE Colleen Cohen is a 65 y.o. year old female born on [birth year], with a diagnosis of left breast cancer.  She had several suspicious lesions in the left lateral breast area.  She decided to have a partial mastectomy with immediate reconstruction using a breast reduction technique.  She is aware that a revision may be needed after radiation.  She also has a history of symptomatic macromastia with concominant back pain, neck pain, shoulder grooving from her bra.   MRN: [id number]  CONSENT Informed consent was obtained directly from the patient. The risks, benefits and alternatives were fully discussed. Specific risks including but not limited to bleeding, infection, hematoma, seroma, scarring, pain, nipple necrosis, asymmetry, poor cosmetic results, loss of Nipple and need for further surgery were discussed. The patient had ample opportunity to have her questions answered to her satisfaction.  DESCRIPTION OF PROCEDURE  Patient was brought into the operating room and placed in a supine position.  SCDs were placed and appropriate padding was performed.  Antibiotics were given. The patient underwent general anesthesia and the chest was prepped and draped in a sterile fashion.  A timeout was performed and all information was  confirmed to be correct.  Preoperative markings were confirmed.  We started on the left breast with the general surgery team.  The incision lines were injected with 1% Xylocaine with epinephrine.  After waiting for vasoconstriction, the marked lines were incised.  An inferior pedical breast reduction was performed by de-epithelializing the pedicle, using bovie to create the lateral pedicle. General surgery than performed the partial mastectomy that included the lateral portion with 2 wires and a seed.  Once complete the patient was surrendered to the plastic surgery team.  The remaining portion of the lateral pedicle was excised.  The breast tissue from the superior, lateral, and medial portions of the breast were excised with the bovie.  Care was taken to not undermine the breast pedicle. The pedicle trimmed to get better blood flow to the nipple areola complex. Hemostasis was achieved.  The nipple was gently rotated into position and the skin was closed with 3-0 Monocryl.  The patient was sat upright and size and shape symmetry was confirmed.  The pocket was irrigated, a drain was placed, and hemostasis confirmed.  The drain was secured with 4-0 Silk. The deep tissues were approximated with 3-0 Vicryl sutures and the skin was closed with deep dermal and subcuticular 4-0 Monocryl sutures followed by 5-0 Monocryl.  The nipple areola complex was brought out with the skin de-epithelialized at the location to make place for the complex.  The area was secured with 4-0 Monocryl at the deep layers followed by 5-0 Monocryl.  The nipple and skin flaps had capillary refill at the end of the procedure.   Right breast: We started on the left breast with the general surgery team.  The  incision lines were injected with 1% Xylocaine with epinephrine.  After waiting for vasoconstriction, the marked lines were incised.  An inferior pedical breast reduction was performed by de-epithelializing the pedicle, using bovie to create  the lateral and medial pedicles, and removing breast tissue from the superior, lateral, and medial portions of the breast.  Care was taken to not undermine the breast pedicle. Hemostasis was achieved.  The nipple was gently rotated into position and the skin was temporarily closed with staples.  The patient was sat upright and size and shape symmetry was confirmed.  The pocket was irrigated, a drain was placed, and hemostasis confirmed.  The drain was secured with 4-0 Silk.  The deep tissues were approximated with 3-0 Vicryl sutures and the skin was closed with deep dermal and subcuticular 4-0 Monocryl sutures followed by 5-0 Monocryl.  The nipple areola complex was brought out with the skin de-epithelialized at the location to make place for the complex.  The area was secured with 4-0 Vicryl at the deep layers followed by 5-0 Monocryl.  The nipple and skin flaps had capillary refill at the end of the procedure. The patient tolerated the procedure well. The patient was allowed to wake from anesthesia and taken to the recovery room in satisfactory condition.

## 2015-06-18 ENCOUNTER — Encounter (HOSPITAL_BASED_OUTPATIENT_CLINIC_OR_DEPARTMENT_OTHER): Payer: Self-pay | Admitting: General Surgery

## 2015-06-18 DIAGNOSIS — D0502 Lobular carcinoma in situ of left breast: Secondary | ICD-10-CM | POA: Diagnosis not present

## 2015-06-18 MED ORDER — DIAZEPAM 2 MG PO TABS: 2.0000 mg | ORAL_TABLET | Freq: Two times a day (BID) | ORAL | Status: DC | PRN

## 2015-06-18 MED ORDER — HYDROCODONE-ACETAMINOPHEN 5-325 MG PO TABS: 1.0000 | ORAL_TABLET | ORAL | Status: DC | PRN

## 2015-06-18 MED ORDER — ONDANSETRON 4 MG PO TBDP: 4.0000 mg | ORAL_TABLET | Freq: Four times a day (QID) | ORAL | Status: DC | PRN

## 2015-06-18 MED ORDER — ACETAMINOPHEN 500 MG PO TABS: 1000.0000 mg | ORAL_TABLET | Freq: Four times a day (QID) | ORAL | Status: DC

## 2015-06-18 MED ORDER — POLYETHYLENE GLYCOL 3350 17 G PO PACK: 17.0000 g | PACK | Freq: Every day | ORAL | Status: DC | PRN

## 2015-06-18 MED ORDER — CEPHALEXIN 500 MG PO CAPS: 500.0000 mg | ORAL_CAPSULE | Freq: Four times a day (QID) | ORAL | Status: DC

## 2015-06-18 MED ORDER — DOCUSATE SODIUM 100 MG PO CAPS: 100.0000 mg | ORAL_CAPSULE | Freq: Two times a day (BID) | ORAL | Status: DC

## 2015-06-18 NOTE — Discharge Instructions (Signed)

## 2015-06-18 NOTE — Discharge Summary (Signed)
Physician Discharge Summary  Patient ID: Colleen Cohen MRN: [id number] DOB/AGE: 1950/11/21 65 y.o.  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 Workup showed left breast lobular caner foci x 2 near each other in the upper outer portion of the left breast.   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 She underwent the procedure on 06/17/15 and is medically stable and ready for discharge home with family at this time.   Significant Diagnostic Studies: Pathology is pending   Discharge Exam: Blood pressure 99/49, pulse 69, temperature 98.4 F (36.9 C), temperature source Oral, resp. rate 16, height 5\' 7"  (1.702 m), weight 115.214 kg (254 lb), SpO2 96 %. General appearance: alert, cooperative, appears stated age and mild distress Resp: clear to auscultation bilaterally Cardio: regular rate and rhythm bilateral breast incisions are clean, dry and intact and without signs of hematoma. the NAC are bruised but viable. JP drainage is serosanguinous and as expected  Disposition: Discharge home with family    Medication List    TAKE these medications        acetaminophen 500 MG tablet  Commonly known as:  TYLENOL  Take 2 tablets (1,000 mg total) by mouth every 6 (six) hours.     cephALEXin 500 MG capsule  Commonly known as:  KEFLEX  Take 1 capsule (500 mg total) by mouth 4 (four) times daily.     diazepam 2 MG tablet  Commonly known as:  VALIUM  Take 1 tablet (2 mg total) by mouth every 12 (twelve) hours as needed for muscle spasms.     docusate sodium 100 MG capsule  Commonly known as:  COLACE  Take 1 capsule (100  mg total) by mouth 2 (two) times daily.     etodolac 400 MG tablet  Commonly known as:  LODINE  Take 400 mg by mouth 2 (two) times daily.     FLUoxetine 20 MG capsule  Commonly known as:  PROZAC  Take 20 mg by mouth daily.     HYDROcodone-acetaminophen 5-325 MG tablet  Commonly known as:  NORCO/VICODIN  Take 1-2 tablets by mouth every 4 (four) hours as needed for moderate pain.     levothyroxine 112 MCG tablet  Commonly known as:  SYNTHROID, LEVOTHROID  Take 112 mcg by mouth daily before breakfast.     metoprolol succinate 50 MG 24 hr tablet  Commonly known as:  TOPROL-XL  Take 50 mg by mouth daily. Take with or immediately following a meal.     ondansetron 4 MG disintegrating tablet  Commonly known as:  ZOFRAN-ODT  Take 1 tablet (4 mg total) by mouth every 6 (six) hours as needed for nausea.     pantoprazole 40 MG tablet  Commonly known as:  PROTONIX  Take 40 mg by mouth daily.     polyethylene glycol packet  Commonly known as:  MIRALAX / GLYCOLAX  Take 17 g by mouth daily as needed for mild constipation.     topiramate 200 MG tablet  Commonly known as:  TOPAMAX  Take 200 mg by mouth 2 (two) times daily.     zolpidem 10 MG tablet  Commonly known as:  AMBIEN  Take 10 mg by mouth at bedtime as needed for sleep.           Follow-up Information    Follow up with Wallace Going, DO In 1 week.   Specialty:  Plastic Surgery   Contact information:   [place] [address] [phone]       Follow up with Judeth Horn, MD In 2 weeks.   Specialty:  General Surgery   Contact information:   [address] [place] [address] [phone]       Signed: Ulysees [facility] [id number] 06/18/15 8:42 am

## 2015-06-24 ENCOUNTER — Other Ambulatory Visit: Payer: Self-pay | Admitting: Oncology

## 2015-06-27 ENCOUNTER — Other Ambulatory Visit: Payer: Self-pay | Admitting: Oncology

## 2015-07-04 ENCOUNTER — Ambulatory Visit: Payer: Self-pay | Admitting: General Surgery

## 2015-07-09 MED ORDER — CEFAZOLIN SODIUM-DEXTROSE 2-4 GM/100ML-% IV SOLN
2.0000 g | INTRAVENOUS | Status: AC
  Administered 2015-07-10: 2 g via INTRAVENOUS
  Filled 2015-07-09: qty 100

## 2015-07-09 MED ORDER — ACETAMINOPHEN 500 MG PO TABS
1000.0000 mg | ORAL_TABLET | ORAL | Status: AC
  Administered 2015-07-10: 1000 mg via ORAL
  Filled 2015-07-09: qty 2

## 2015-07-09 NOTE — Progress Notes (Signed)
Pt made aware to stop taking Aspirin vitamins, fish oil and herbal medications. Do not take any NSAIDs ie: Ibuprofen, Advil, Naproxen, BC and Goody Powder or any medication containing Aspirin. Pt verbalized understanding of all pre-op instructions.

## 2015-07-10 ENCOUNTER — Encounter (HOSPITAL_COMMUNITY)
Admission: RE | Disposition: A | Discharge status: Home / Self Care | Payer: Self-pay | Source: Ambulatory Visit | Attending: General Surgery

## 2015-07-10 ENCOUNTER — Other Ambulatory Visit: Payer: Self-pay | Admitting: Plastic Surgery

## 2015-07-10 ENCOUNTER — Ambulatory Visit (HOSPITAL_COMMUNITY): Payer: BLUE CROSS/BLUE SHIELD | Admitting: Certified Registered Nurse Anesthetist

## 2015-07-10 ENCOUNTER — Ambulatory Visit (HOSPITAL_COMMUNITY)
Admission: RE | Admit: 2015-07-10 | Discharge: 2015-07-10 | Disposition: A | Discharge status: Home / Self Care | Payer: BLUE CROSS/BLUE SHIELD | Source: Ambulatory Visit | Attending: General Surgery | Admitting: General Surgery

## 2015-07-10 ENCOUNTER — Encounter (HOSPITAL_COMMUNITY): Payer: Self-pay | Admitting: Certified Registered Nurse Anesthetist

## 2015-07-10 DIAGNOSIS — F329 Major depressive disorder, single episode, unspecified: Secondary | ICD-10-CM | POA: Insufficient documentation

## 2015-07-10 DIAGNOSIS — Z79899 Other long term (current) drug therapy: Secondary | ICD-10-CM | POA: Insufficient documentation

## 2015-07-10 DIAGNOSIS — F419 Anxiety disorder, unspecified: Secondary | ICD-10-CM | POA: Insufficient documentation

## 2015-07-10 DIAGNOSIS — C50412 Malignant neoplasm of upper-outer quadrant of left female breast: Secondary | ICD-10-CM | POA: Insufficient documentation

## 2015-07-10 DIAGNOSIS — E039 Hypothyroidism, unspecified: Secondary | ICD-10-CM | POA: Diagnosis not present

## 2015-07-10 DIAGNOSIS — K219 Gastro-esophageal reflux disease without esophagitis: Secondary | ICD-10-CM | POA: Insufficient documentation

## 2015-07-10 DIAGNOSIS — Z6839 Body mass index (BMI) 39.0-39.9, adult: Secondary | ICD-10-CM | POA: Diagnosis not present

## 2015-07-10 DIAGNOSIS — I10 Essential (primary) hypertension: Secondary | ICD-10-CM | POA: Insufficient documentation

## 2015-07-10 DIAGNOSIS — C50912 Malignant neoplasm of unspecified site of left female breast: Secondary | ICD-10-CM

## 2015-07-10 HISTORY — PX: RE-EXCISION OF BREAST LUMPECTOMY: SHX6048

## 2015-07-10 HISTORY — PX: BREAST RECONSTRUCTION: SHX9

## 2015-07-10 HISTORY — DX: Unspecified osteoarthritis, unspecified site: M19.90

## 2015-07-10 LAB — CBC WITH DIFFERENTIAL/PLATELET
Basophils Absolute: 0 10*3/uL (ref 0.0–0.1)
Basophils Relative: 0 %
Eosinophils Absolute: 0.4 10*3/uL (ref 0.0–0.7)
Eosinophils Relative: 5 %
HEMATOCRIT: 35.7 % — AB (ref 36.0–46.0)
Hemoglobin: 10.7 g/dL — ABNORMAL LOW (ref 12.0–15.0)
LYMPHS PCT: 35 %
Lymphs Abs: 2.7 10*3/uL (ref 0.7–4.0)
MCH: 25.1 pg — AB (ref 26.0–34.0)
MCHC: 30 g/dL (ref 30.0–36.0)
MCV: 83.6 fL (ref 78.0–100.0)
MONO ABS: 0.5 10*3/uL (ref 0.1–1.0)
MONOS PCT: 6 %
NEUTROS ABS: 4 10*3/uL (ref 1.7–7.7)
Neutrophils Relative %: 54 %
PLATELETS: 330 10*3/uL (ref 150–400)
RBC: 4.27 MIL/uL (ref 3.87–5.11)
RDW: 13.6 % (ref 11.5–15.5)
WBC: 7.6 10*3/uL (ref 4.0–10.5)

## 2015-07-10 LAB — BASIC METABOLIC PANEL
ANION GAP: 10 (ref 5–15)
BUN: 16 mg/dL (ref 6–20)
CO2: 28 mmol/L (ref 22–32)
Calcium: 9.3 mg/dL (ref 8.9–10.3)
Chloride: 103 mmol/L (ref 101–111)
Creatinine, Ser: 0.97 mg/dL (ref 0.44–1.00)
GFR calc Af Amer: >60 mL/min (ref >60)
GFR calc non Af Amer: >60 mL/min (ref >60)
GLUCOSE: 105 mg/dL — AB (ref 65–99)
POTASSIUM: 4.3 mmol/L (ref 3.5–5.1)
Sodium: 141 mmol/L (ref 135–145)

## 2015-07-10 SURGERY — EXCISION, LESION, BREAST
Anesthesia: General | Site: Breast | Laterality: Left

## 2015-07-10 MED ORDER — FENTANYL CITRATE (PF) 100 MCG/2ML IJ SOLN: 25.0000 ug | INTRAMUSCULAR | Status: DC | PRN

## 2015-07-10 MED ORDER — PROPOFOL 10 MG/ML IV BOLUS
INTRAVENOUS | Status: DC | PRN
  Administered 2015-07-10: 60 mg via INTRAVENOUS
  Administered 2015-07-10: 140 mg via INTRAVENOUS

## 2015-07-10 MED ORDER — ONDANSETRON HCL 4 MG/2ML IJ SOLN
INTRAMUSCULAR | Status: DC | PRN
  Administered 2015-07-10: 4 mg via INTRAVENOUS

## 2015-07-10 MED ORDER — BUPIVACAINE HCL (PF) 0.25 % IJ SOLN
INTRAMUSCULAR | Status: AC
  Filled 2015-07-10: qty 30

## 2015-07-10 MED ORDER — DEXAMETHASONE SODIUM PHOSPHATE 10 MG/ML IJ SOLN
INTRAMUSCULAR | Status: DC | PRN
  Administered 2015-07-10: 10 mg via INTRAVENOUS

## 2015-07-10 MED ORDER — FENTANYL CITRATE (PF) 250 MCG/5ML IJ SOLN
INTRAMUSCULAR | Status: AC
  Filled 2015-07-10: qty 5

## 2015-07-10 MED ORDER — PROPOFOL 1000 MG/100ML IV EMUL
INTRAVENOUS | Status: AC
  Filled 2015-07-10: qty 100

## 2015-07-10 MED ORDER — ACETAMINOPHEN 160 MG/5ML PO SOLN: 325.0000 mg | ORAL | Status: DC | PRN

## 2015-07-10 MED ORDER — MIDAZOLAM HCL 2 MG/2ML IJ SOLN
INTRAMUSCULAR | Status: AC
  Filled 2015-07-10: qty 2

## 2015-07-10 MED ORDER — CHLORHEXIDINE GLUCONATE CLOTH 2 % EX PADS: 6.0000 | MEDICATED_PAD | Freq: Once | CUTANEOUS | Status: DC

## 2015-07-10 MED ORDER — LIDOCAINE HCL (PF) 1 % IJ SOLN
INTRAMUSCULAR | Status: AC
  Filled 2015-07-10: qty 30

## 2015-07-10 MED ORDER — ARTIFICIAL TEARS OP OINT
TOPICAL_OINTMENT | OPHTHALMIC | Status: DC | PRN
  Administered 2015-07-10: 1 via OPHTHALMIC

## 2015-07-10 MED ORDER — BUPIVACAINE-EPINEPHRINE (PF) 0.5% -1:200000 IJ SOLN
INTRAMUSCULAR | Status: AC
  Filled 2015-07-10: qty 30

## 2015-07-10 MED ORDER — OXYCODONE HCL 5 MG/5ML PO SOLN: 5.0000 mg | Freq: Once | ORAL | Status: DC | PRN

## 2015-07-10 MED ORDER — GLYCOPYRROLATE 0.2 MG/ML IV SOSY
PREFILLED_SYRINGE | INTRAVENOUS | Status: AC
  Filled 2015-07-10: qty 3

## 2015-07-10 MED ORDER — SUCCINYLCHOLINE CHLORIDE 200 MG/10ML IV SOSY
PREFILLED_SYRINGE | INTRAVENOUS | Status: AC
  Filled 2015-07-10: qty 10

## 2015-07-10 MED ORDER — LIDOCAINE-EPINEPHRINE 1 %-1:100000 IJ SOLN
INTRAMUSCULAR | Status: DC | PRN
  Administered 2015-07-10: 3 mL

## 2015-07-10 MED ORDER — SCOPOLAMINE 1 MG/3DAYS TD PT72
1.0000 | MEDICATED_PATCH | TRANSDERMAL | Status: DC
  Administered 2015-07-10: 1.5 mg via TRANSDERMAL
  Filled 2015-07-10: qty 1

## 2015-07-10 MED ORDER — ACETAMINOPHEN 325 MG PO TABS: 325.0000 mg | ORAL_TABLET | ORAL | Status: DC | PRN

## 2015-07-10 MED ORDER — MIDAZOLAM HCL 2 MG/2ML IJ SOLN
INTRAMUSCULAR | Status: DC | PRN
  Administered 2015-07-10 (×2): 1 mg via INTRAVENOUS

## 2015-07-10 MED ORDER — DEXAMETHASONE SODIUM PHOSPHATE 10 MG/ML IJ SOLN
INTRAMUSCULAR | Status: AC
  Filled 2015-07-10: qty 1

## 2015-07-10 MED ORDER — FENTANYL CITRATE (PF) 250 MCG/5ML IJ SOLN
INTRAMUSCULAR | Status: DC | PRN
  Administered 2015-07-10: 100 ug via INTRAVENOUS

## 2015-07-10 MED ORDER — PROPOFOL 10 MG/ML IV BOLUS
INTRAVENOUS | Status: AC
  Filled 2015-07-10: qty 20

## 2015-07-10 MED ORDER — SCOPOLAMINE 1 MG/3DAYS TD PT72
MEDICATED_PATCH | TRANSDERMAL | Status: AC
  Filled 2015-07-10: qty 1

## 2015-07-10 MED ORDER — PROPOFOL 500 MG/50ML IV EMUL
INTRAVENOUS | Status: DC | PRN
  Administered 2015-07-10: 25 ug/kg/min via INTRAVENOUS

## 2015-07-10 MED ORDER — ROCURONIUM BROMIDE 50 MG/5ML IV SOLN
INTRAVENOUS | Status: AC
  Filled 2015-07-10: qty 1

## 2015-07-10 MED ORDER — SUCCINYLCHOLINE CHLORIDE 20 MG/ML IJ SOLN
INTRAMUSCULAR | Status: DC | PRN
  Administered 2015-07-10: 100 mg via INTRAVENOUS

## 2015-07-10 MED ORDER — LIDOCAINE 2% (20 MG/ML) 5 ML SYRINGE
INTRAMUSCULAR | Status: AC
  Filled 2015-07-10: qty 5

## 2015-07-10 MED ORDER — CEFAZOLIN SODIUM-DEXTROSE 2-4 GM/100ML-% IV SOLN: 2.0000 g | INTRAVENOUS | Status: DC

## 2015-07-10 MED ORDER — LIDOCAINE-EPINEPHRINE 1 %-1:100000 IJ SOLN
INTRAMUSCULAR | Status: AC
  Filled 2015-07-10: qty 1

## 2015-07-10 MED ORDER — BUPIVACAINE-EPINEPHRINE (PF) 0.25% -1:200000 IJ SOLN
INTRAMUSCULAR | Status: AC
  Filled 2015-07-10: qty 30

## 2015-07-10 MED ORDER — EPHEDRINE SULFATE 50 MG/ML IJ SOLN
INTRAMUSCULAR | Status: DC | PRN
  Administered 2015-07-10: 15 mg via INTRAVENOUS
  Administered 2015-07-10: 10 mg via INTRAVENOUS

## 2015-07-10 MED ORDER — LIDOCAINE HCL (CARDIAC) 20 MG/ML IV SOLN
INTRAVENOUS | Status: DC | PRN
  Administered 2015-07-10: 60 mg via INTRATRACHEAL

## 2015-07-10 MED ORDER — LACTATED RINGERS IV SOLN
INTRAVENOUS | Status: DC
  Administered 2015-07-10: 09:00:00 via INTRAVENOUS

## 2015-07-10 MED ORDER — HYDROCODONE-ACETAMINOPHEN 5-325 MG PO TABS: 1.0000 | ORAL_TABLET | Freq: Four times a day (QID) | ORAL | Status: DC | PRN

## 2015-07-10 MED ORDER — ARTIFICIAL TEARS OP OINT
TOPICAL_OINTMENT | OPHTHALMIC | Status: AC
  Filled 2015-07-10: qty 3.5

## 2015-07-10 MED ORDER — 0.9 % SODIUM CHLORIDE (POUR BTL) OPTIME
TOPICAL | Status: DC | PRN
  Administered 2015-07-10: 1000 mL

## 2015-07-10 MED ORDER — ONDANSETRON HCL 4 MG/2ML IJ SOLN
INTRAMUSCULAR | Status: AC
  Filled 2015-07-10: qty 2

## 2015-07-10 MED ORDER — KETOROLAC TROMETHAMINE 30 MG/ML IJ SOLN
INTRAMUSCULAR | Status: AC
  Filled 2015-07-10: qty 1

## 2015-07-10 MED ORDER — SULFAMETHOXAZOLE-TRIMETHOPRIM 800-160 MG PO TABS: 1.0000 | ORAL_TABLET | Freq: Two times a day (BID) | ORAL | Status: DC

## 2015-07-10 MED ORDER — GLYCOPYRROLATE 0.2 MG/ML IJ SOLN
INTRAMUSCULAR | Status: DC | PRN
  Administered 2015-07-10 (×2): 0.2 mg via INTRAVENOUS

## 2015-07-10 MED ORDER — OXYCODONE HCL 5 MG PO TABS: 5.0000 mg | ORAL_TABLET | Freq: Once | ORAL | Status: DC | PRN

## 2015-07-10 SURGICAL SUPPLY — 48 items
BENZOIN TINCTURE PRP APPL 2/3 (GAUZE/BANDAGES/DRESSINGS) ×2 IMPLANT
BINDER BREAST LRG (GAUZE/BANDAGES/DRESSINGS) IMPLANT
BINDER BREAST XLRG (GAUZE/BANDAGES/DRESSINGS) IMPLANT
BINDER BREAST XXLRG (GAUZE/BANDAGES/DRESSINGS) ×2 IMPLANT
CANISTER SUCTION 2500CC (MISCELLANEOUS) ×2 IMPLANT
CHLORAPREP W/TINT 26ML (MISCELLANEOUS) ×2 IMPLANT
CLEANER TIP ELECTROSURG 2X2 (MISCELLANEOUS) ×2 IMPLANT
CONT SPEC 4OZ CLIKSEAL STRL BL (MISCELLANEOUS) IMPLANT
COVER SURGICAL LIGHT HANDLE (MISCELLANEOUS) ×2 IMPLANT
DERMABOND ADVANCED (GAUZE/BANDAGES/DRESSINGS) ×1
DERMABOND ADVANCED .7 DNX12 (GAUZE/BANDAGES/DRESSINGS) ×1 IMPLANT
DRAPE LAPAROTOMY TRNSV 102X78 (DRAPE) ×2 IMPLANT
DRAPE UTILITY XL STRL (DRAPES) ×2 IMPLANT
DRSG PAD ABDOMINAL 8X10 ST (GAUZE/BANDAGES/DRESSINGS) ×4 IMPLANT
ELECT REM PT RETURN 9FT ADLT (ELECTROSURGICAL) ×2
ELECTRODE REM PT RTRN 9FT ADLT (ELECTROSURGICAL) ×1 IMPLANT
GAUZE SPONGE 4X4 12PLY STRL (GAUZE/BANDAGES/DRESSINGS) ×2 IMPLANT
GAUZE XEROFORM 5X9 LF (GAUZE/BANDAGES/DRESSINGS) ×2 IMPLANT
GLOVE BIO SURGEON STRL SZ7 (GLOVE) ×2 IMPLANT
GLOVE BIOGEL PI IND STRL 7.0 (GLOVE) ×1 IMPLANT
GLOVE BIOGEL PI IND STRL 7.5 (GLOVE) ×1 IMPLANT
GLOVE BIOGEL PI IND STRL 8 (GLOVE) ×1 IMPLANT
GLOVE BIOGEL PI INDICATOR 7.0 (GLOVE) ×1
GLOVE BIOGEL PI INDICATOR 7.5 (GLOVE) ×1
GLOVE BIOGEL PI INDICATOR 8 (GLOVE) ×1
GLOVE ECLIPSE 7.5 STRL STRAW (GLOVE) ×2 IMPLANT
GLOVE SURG SS PI 7.5 STRL IVOR (GLOVE) ×2 IMPLANT
GOWN STRL REUS W/ TWL LRG LVL3 (GOWN DISPOSABLE) ×2 IMPLANT
GOWN STRL REUS W/TWL LRG LVL3 (GOWN DISPOSABLE) ×2
KIT BASIN OR (CUSTOM PROCEDURE TRAY) ×2 IMPLANT
KIT MARKER MARGIN INK (KITS) ×2 IMPLANT
KIT ROOM TURNOVER OR (KITS) ×2 IMPLANT
NS IRRIG 1000ML POUR BTL (IV SOLUTION) ×2 IMPLANT
PACK GENERAL/GYN (CUSTOM PROCEDURE TRAY) ×2 IMPLANT
PAD ARMBOARD 7.5X6 YLW CONV (MISCELLANEOUS) ×4 IMPLANT
STRIP CLOSURE SKIN 1/4X4 (GAUZE/BANDAGES/DRESSINGS) ×2 IMPLANT
SUT MNCRL AB 3-0 PS2 18 (SUTURE) ×6 IMPLANT
SUT MNCRL AB 4-0 PS2 18 (SUTURE) ×4 IMPLANT
SUT MON AB 5-0 PS2 18 (SUTURE) ×2 IMPLANT
SUT SILK 3 0 SH 30 (SUTURE) ×2 IMPLANT
SUT VIC AB 3-0 SH 27 (SUTURE) ×1
SUT VIC AB 3-0 SH 27X BRD (SUTURE) ×1 IMPLANT
SUT VIC AB 4-0 SH 27 (SUTURE)
SUT VIC AB 4-0 SH 27XBRD (SUTURE) IMPLANT
SUT VIC AB 5-0 P-3 18XBRD (SUTURE) IMPLANT
SUT VIC AB 5-0 P3 18 (SUTURE)
SYR CONTROL 10ML LL (SYRINGE) ×2 IMPLANT
TOWEL OR 17X26 10 PK STRL BLUE (TOWEL DISPOSABLE) ×2 IMPLANT

## 2015-07-10 NOTE — H&P (Signed)
Colleen Cohen is an 65 y.o. female.   Chief Complaint: Positive margins from previous left localized lumpectomy combined with reduction mammoplasty HPI: Patient underwent a left wire and seed localized lumpectomt/reduction mammoplasty on 5/23 with subsequently determined positive supeiror and anterior margins.  She is coming in today for re-excision.  Past Medical History  Diagnosis Date  . Hypertension   . GERD (gastroesophageal reflux disease)   . Anxiety   . Depression   . Hypothyroidism   . Nephrolithiasis   . Myopia of both eyes   . PONV (postoperative nausea and vomiting)   . Migraine   . Wears dentures     Past Surgical History  Procedure Laterality Date  . Cholecystectomy    . Back surgery    . Thyroidectomy    . Partial nephrectomy    . Shoulder arthroscopy    . Cystoscopy w/ ureteral stent placement    . Breast lumpectomy with radioactive seed and sentinel lymph node biopsy Left 06/17/2015    Procedure: LEFT BREAST LUMPECTOMY WITH RADIOACTIVE SEED AND SENTINEL LYMPH NODE BIOPSY, NEEDLE LOCALIZATION IN ATYPICAL DUCTAL HYPERPLASIA  AREA OF LEFT BREAST;  Surgeon: Judeth Horn, MD;  Location: [place];  Service: General;  Laterality: Left;  . Breast reduction surgery Bilateral 06/17/2015    Procedure: BILATERAL BREAST  REDUCTION  WITH RECONSTRUCTION  ON LEFT  AFTER PARTIAL MASTECTOMY;  Surgeon: Wallace Going, DO;  Location: [place];  Service: Plastics;  Laterality: Bilateral;    No family history on file. Social History:  reports that she has never smoked. She has never used smokeless tobacco. She reports that she drinks alcohol. She reports that she does not use illicit drugs.  Allergies:  Allergies  Allergen Reactions  . Codeine Rash    No prescriptions prior to admission    No results found for this or any previous visit (from the past 48 hour(s)). No results found.  Review of Systems  Constitutional: Negative for fever  and chills.    There were no vitals taken for this visit. Physical Exam  Nursing note and vitals reviewed. Constitutional: She is oriented to person, place, and time. She appears well-developed and well-nourished.  HENT:  Head: Normocephalic and atraumatic.  Eyes: EOM are normal. Pupils are equal, round, and reactive to light.  Neck: Normal range of motion. Neck supple.  Cardiovascular: Normal rate, regular rhythm and normal heart sounds.   Respiratory: Effort normal and breath sounds normal. Right breast exhibits nipple discharge (serosanguinous discharge this AM), skin change (central necrosis) and tenderness. Left breast exhibits tenderness.    GI: Soft. Bowel sounds are normal.  Musculoskeletal: Normal range of motion.  Neurological: She is alert and oriented to person, place, and time.  Skin: Skin is warm and dry.  Psychiatric: She has a normal mood and affect. Her behavior is normal. Judgment and thought content normal.     Assessment/Plan Positive margins from seed and wire localization of left breast lobular cancer  Re-excision planned today. Preoperative antibiotics planned.  Judeth Horn, MD 07/10/2015, 6:06 AM

## 2015-07-10 NOTE — Op Note (Signed)
OPERATIVE REPORT  DATE OF OPERATION: 07/10/2015  PATIENT:  Colleen Cohen  65 y.o. female  PRE-OPERATIVE DIAGNOSIS:  LEFT BREAST CANCER/PREVIOUSLY EXCISED WITH POSITIVE MARGINS  POST-OPERATIVE DIAGNOSIS:  SAME  FINDINGS:  Normal looking, healing breast tissue.  No obvious gross recurrence  PROCEDURE:  Procedure(s): RE-EXCISION OF LEFT BREAST LUMPECTOMY  REVISION OF LEFT BREAST REDUCTION  SURGEON:  Surgeon(s): Judeth Horn, MD Wallace Going, DO  ASSISTANT: Erlinda Hong, PA-C, Dr. Marla Roe, M.d.  ANESTHESIA:   general  COMPLICATIONS:  None  EBL: <20 ml  BLOOD ADMINISTERED: none  DRAINS: none   SPECIMEN:  Source of Specimen:  7cm long x 5cm deep x 2cm thick oval piec of re-excise tissue with skin patch  Lateral marding marked with long suture, medial margin short suture, anterior margin red, superior margin blue, inferior margin yellow  COUNTS CORRECT:  YES  PROCEDURE DETAILS: The patient was taken to the operating room and placed on the table in supine position. Her left on was placed at 90 to the body on an arm board. After an adequate general endotracheal anesthetic was administered she was prepped and draped in the usual sterile manner.  A proper timeout was performed identifying the patient and procedure to be performed. At the inframammary suture line from the previous mammoplasty and lumpectomy, the skin was marked an oval manner approximately 7 cm long and a centimeter wide. #10 blade was used to make the skin incision superiorly and inferiorly and subsequently electrocautery was used to dissect out a sliver of oval skin and breast tissue measuring 7 cm long, 5 cm deeper Colleen Cohen, and 2 cm thick.  The anterior margin of the specimen was marked and red. The superior margin was marked in blue. The inferior margin was marked in yellow. The lateral margin was marked with a long suture. The medial margin was marked with a short suture.  Electrocautery was used to  obtain adequate hemostasis and the closure was done with Vicryl and Monocryl. Sterile dressings were applied including Dermabond. All needle counts, sponge counts, and instrument counts were correct.  PATIENT DISPOSITION:  PACU - hemodynamically stable.   Colleen Cohen 6/15/201710:09 AM

## 2015-07-10 NOTE — Discharge Instructions (Addendum)
Lumpectomy, Care After Refer to this sheet in the next few weeks. These instructions provide you with information on caring for yourself after your procedure. Your health care provider may also give you more specific instructions. Your treatment has been planned according to current medical practices, but problems sometimes occur. Call your health care provider if you have any problems or questions after your procedure. WHAT TO EXPECT AFTER THE PROCEDURE After your procedure, it is typical to have soreness, bruising, and swelling of your breast. This is normal. You will be given medicines to control your pain. HOME CARE INSTRUCTIONS  Take medicines only as directed by your health care provider.  Resume a normal diet as directed by your health care provider.  Resume normal activity as directed by your health care provider. Avoid strenuous activity that affects the arm on the side that the surgical cut (incision) was made. Avoid playing tennis, swimming, lifting heavy objects (those that weigh more than 10 pounds [4.5 kg]), and pulling for 2 weeks.  Change bandages (dressings) as directed by your health care provider.  Consider wearing a bra to bed if you feel discomfort at the breast. Wearing a bra also helps keep dressings on.  Keep all follow-up visits as directed by your health care provider. This is important.  Call for the results of your procedure as instructed by your surgeon. It is your responsibility to get your test results. Do not assume everything is fine if you have not heard from your health care provider.  Keep the incision site dry.  If the incision site is tender, applying an ice pack may relieve some discomfort. To do this:  Put ice in a plastic bag.  Place a towel between your skin and the bag.  Leave the ice on for 15-20 minutes, 3-4 times a day. SEEK MEDICAL CARE IF:   You have increased bleeding from the incision site.  You notice redness, swelling, or  increasing pain in the incision.  You have pus coming from the incision site.  You have a fever.  You notice a foul smell coming from the incision or dressing. SEEK IMMEDIATE MEDICAL CARE IF:   You develop a rash.  You have shortness of breath.  You have chest pain.   This information is not intended to replace advice given to you by your health care provider. Make sure you discuss any questions you have with your health care provider.   Document Released: 01/27/2006 Document Revised: 02/01/2014 Document Reviewed: 08/10/2012 Elsevier Interactive Patient Education 2016 [employer].  May shower starting Saturday. Continue binder

## 2015-07-10 NOTE — H&P (View-Only) (Signed)
Colleen Cohen is an 65 y.o. female.   Chief Complaint: Left breast  Lobular cancer with ADH HPI: Initiallly saw me about two months ago.  Workup has led to the finding of left breast lobular caner foci x 2 near each other in the upper outer portion of the left breast.  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  She is getting a seed lumpectomy and wire loc lumpectomy as one specimen in the UOQ, and a reduction resection of the ADH on the same side.  Past Medical History  Diagnosis Date  . Hypertension   . GERD (gastroesophageal reflux disease)   . Anxiety   . Depression   . Hypothyroidism   . Nephrolithiasis   . Myopia of both eyes   . PONV (postoperative nausea and vomiting)   . Migraine   . Wears dentures     Past Surgical History  Procedure Laterality Date  . Cholecystectomy    . Back surgery    . Thyroidectomy    . Partial nephrectomy    . Shoulder arthroscopy    . Cystoscopy w/ ureteral stent placement      History reviewed. No pertinent family history. Social History:  reports that she has never smoked. She has never used smokeless tobacco. She reports that she drinks alcohol. She reports that she does not use illicit drugs.  Allergies:  Allergies  Allergen Reactions  . Codeine Rash    Medications Prior to Admission  Medication Sig Dispense Refill  . etodolac (LODINE) 400 MG tablet Take 400 mg by mouth 2 (two) times daily.    Marland Kitchen FLUoxetine (PROZAC) 20 MG capsule Take 20 mg by mouth daily.    Marland Kitchen levothyroxine (SYNTHROID, LEVOTHROID) 112 MCG tablet Take 112 mcg by mouth daily before breakfast.    . metoprolol succinate (TOPROL-XL) 50 MG 24 hr tablet Take 50 mg by mouth daily. Take with or immediately following a meal.    . pantoprazole (PROTONIX) 40 MG tablet Take 40 mg by mouth daily.    Marland Kitchen topiramate (TOPAMAX) 200 MG tablet Take 200 mg by mouth 2 (two) times daily.    Marland Kitchen zolpidem (AMBIEN) 10 MG tablet Take 10 mg by mouth at bedtime as needed for sleep.       No results found for this or any previous visit (from the past 48 hour(s)). Nm Sentinel Node Inj-no Rpt (breast)  06/17/2015  CLINICAL DATA: Left Breast Cancer Sulfur colloid was injected intradermally by the nuclear medicine technologist for breast cancer sentinel node localization.    ROS  Blood pressure 118/52, pulse 70, temperature 97.8 F (36.6 C), temperature source Oral, resp. rate 15, height 5\' 7"  (1.702 m), weight 115.214 kg (254 lb), SpO2 100 %. Physical Exam  Constitutional: She is oriented to person, place, and time. She appears well-developed and well-nourished.  HENT:  Head: Normocephalic and atraumatic.  Eyes: EOM are normal. Pupils are equal, round, and reactive to light.  Neck: Normal range of motion.  Cardiovascular: Normal rate.   Respiratory: Effort normal.  Large breast bilaterally  GI: Soft. Bowel sounds are normal.  Musculoskeletal: Normal range of motion.  Neurological: She is alert and oriented to person, place, and time.  Skin: Skin is warm and dry.  Psychiatric: She has a normal mood and affect. Her behavior is normal. Judgment and thought content normal.     Assessment/Plan Breast cancer, invasive lobular in two foci left breast close to each other with ADH in a more distant site,  to be excised with a reduction mammoplasty.  All areas have been marked and a seed has been placed at the cancer site.  Judeth Horn, MD 06/17/2015, 12:52 PM

## 2015-07-10 NOTE — Anesthesia Procedure Notes (Signed)
Procedure Name: Intubation Date/Time: 07/10/2015 9:41 AM Performed by: Collier Bullock Pre-anesthesia Checklist: Patient identified, Emergency Drugs available, Suction available and Patient being monitored Patient Re-evaluated:Patient Re-evaluated prior to inductionOxygen Delivery Method: Circle system utilized Preoxygenation: Pre-oxygenation with 100% oxygen Intubation Type: IV induction Laryngoscope Size: Mac and 3 Grade View: Grade I Tube type: Oral Tube size: 7.0 mm Number of attempts: 2 Airway Equipment and Method: Stylet Placement Confirmation: ETT inserted through vocal cords under direct vision,  positive ETCO2 and breath sounds checked- equal and bilateral Secured at: 22 cm Tube secured with: Tape Dental Injury: Teeth and Oropharynx as per pre-operative assessment

## 2015-07-10 NOTE — Anesthesia Preprocedure Evaluation (Addendum)
Anesthesia Evaluation  Patient identified by MRN, date of birth, ID band Patient awake    Reviewed: Allergy & Precautions, NPO status , Patient's Chart, lab work & pertinent test results, reviewed documented beta blocker date and time   History of Anesthesia Complications (+) PONV and history of anesthetic complications  Airway Mallampati: III  TM Distance: >3 FB Neck ROM: Full    Dental  (+) Teeth Intact, Loose,    Pulmonary neg pulmonary ROS,    Pulmonary exam normal breath sounds clear to auscultation       Cardiovascular hypertension, Pt. on medications and Pt. on home beta blockers Normal cardiovascular exam Rhythm:Regular Rate:Normal     Neuro/Psych  Headaches, PSYCHIATRIC DISORDERS Anxiety Depression    GI/Hepatic Neg liver ROS, GERD  Medicated and Controlled,  Endo/Other  Hypothyroidism Morbid obesity  Renal/GU Renal diseaseNephrolithiasis  negative genitourinary   Musculoskeletal negative musculoskeletal ROS (+)   Abdominal (+) + obese,   Peds  Hematology negative hematology ROS (+)   Anesthesia Other Findings   Reproductive/Obstetrics                            Anesthesia Physical Anesthesia Plan  ASA: III  Anesthesia Plan: General   Post-op Pain Management:    Induction: Intravenous  Airway Management Planned: LMA and Oral ETT  Additional Equipment: None  Intra-op Plan:   Post-operative Plan: Extubation in OR  Informed Consent: I have reviewed the patients History and Physical, chart, labs and discussed the procedure including the risks, benefits and alternatives for the proposed anesthesia with the patient or authorized representative who has indicated his/her understanding and acceptance.   Dental advisory given  Plan Discussed with: CRNA and Surgeon  Anesthesia Plan Comments:         Anesthesia Quick Evaluation

## 2015-07-10 NOTE — Anesthesia Postprocedure Evaluation (Signed)
Anesthesia Post Note  Patient: Colleen Cohen  Procedure(s) Performed: Procedure(s) (LRB): RE-EXCISION OF LEFT BREAST LUMPECTOMY  (Left) REVISION OF LEFT BREAST REDUCTION (Left)  Patient location during evaluation: PACU Anesthesia Type: General Level of consciousness: awake Pain management: pain level controlled Vital Signs Assessment: post-procedure vital signs reviewed and stable Respiratory status: spontaneous breathing Cardiovascular status: stable Postop Assessment: no signs of nausea or vomiting Anesthetic complications: no    Last Vitals:  Filed Vitals:   07/10/15 1115 07/10/15 1120  BP: 130/62 136/63  Pulse: 63 63  Temp:  36.7 C  Resp: 16 16    Last Pain:  Filed Vitals:   07/10/15 1127  PainSc: 0-No pain                 Janalynn Eder

## 2015-07-10 NOTE — Brief Op Note (Signed)
07/10/2015  9:37 AM  PATIENT:  Colleen Cohen  65 y.o. female  PRE-OPERATIVE DIAGNOSIS:  LEFT BREAST CANCER  POST-OPERATIVE DIAGNOSIS:  LEFT BREAST CANCER  PROCEDURE:  Procedure(s): RE-EXCISION OF LEFT BREAST LUMPECTOMY  (Left) BREAST RECONSTRUCTION (Left)  SURGEON:  Surgeon(s) and Role: Panel 1:    * Judeth Horn, MD - Primary  Panel 2:    * Loel Lofty Jaymari Cromie, DO - Primary  PHYSICIAN ASSISTANT: Shawn Rayburn, PA  ASSISTANTS: none   ANESTHESIA:   general  EBL:     BLOOD ADMINISTERED:none  DRAINS: none   LOCAL MEDICATIONS USED:  LIDOCAINE   SPECIMEN:  Source of Specimen:  left breast tissue  DISPOSITION OF SPECIMEN:  PATHOLOGY  COUNTS:  YES  TOURNIQUET:  * No tourniquets in log *  DICTATION: .Dragon Dictation  PLAN OF CARE: Discharge to home after PACU  PATIENT DISPOSITION:  PACU - hemodynamically stable.   Delay start of Pharmacological VTE agent (>24hrs) due to surgical blood loss or risk of bleeding: no

## 2015-07-10 NOTE — Op Note (Signed)
Operative Note   DATE OF OPERATION: 07/10/2015  LOCATION: [facility]  SURGICAL DIVISION: Plastic Surgery  PREOPERATIVE DIAGNOSES:  Left breast cancer  POSTOPERATIVE DIAGNOSES:  same  PROCEDURE:  Revision of left breast reduction after resection for breast cancer positive margin  SURGEON: Glee Lashomb Sanger Rona Tomson, DO  ASSISTANT: Shawn Rayburn, PA  ANESTHESIA:  General.   COMPLICATIONS: None.   INDICATIONS FOR PROCEDURE:  The patient, Colleen Cohen is a 65 y.o. female born on [birth year], is here for treatment of left breast cancer.  The patient underwent a partial mastectomy of the left breast several weeks ago.  The margins were positive.  The decision was made for re-excision.  We participated in closure and revision of the reduction after the resection. MRN: [id number]  CONSENT:  Informed consent was obtained directly from the patient. Risks, benefits and alternatives were fully discussed. Specific risks including but not limited to bleeding, infection, hematoma, seroma, scarring, pain, infection, contracture, asymmetry, wound healing problems, and need for further surgery were all discussed. The patient did have an ample opportunity to have questions answered to satisfaction.   DESCRIPTION OF PROCEDURE:  The patient was taken to the operating room. SCDs were placed and IV antibiotics were given. The patient's operative site was prepped and draped in a sterile fashion. A time out was performed and all information was confirmed to be correct.  General anesthesia was administered.  The general surgeon performed their portion of the case which is dictated and includes re-excision of partial mastectomy of left breast.  Once complete, the patient was rendered to our service.  Hemostasis was achieved with electrocautery.  The 3-0 Monocryl was used to close the deep layers.  The 4-0 and 5-0 Monocryl was used to close the skin which was 4 x 10 cm in size.  The area was dry so  no drain was placed.  The incision was covered with dermabond.  A breast binder and ABD was applied. The patient tolerated the procedure well.  There were no complications. The patient was allowed to wake from anesthesia, extubated and taken to the recovery room in satisfactory condition.

## 2015-07-10 NOTE — Interval H&P Note (Signed)
History and Physical Interval Note:  This patient underwent a wire and seed localization on the left breast associatged with lumpectomy combined with a mammoplasty for reduction.  She also underwent a reduction ono the right side where there was no cancer at all.    Postoperatively she has had some sloughing of the nipple on the right, but most importantly she had positive anterior and superior margins on the lumpectomy left side that requires re-excision.  That is what she is being brought to the OR for today.  She has bilateral reconstructed breast from reduction mammoplasties, and the plan is to re-excise the area on the left to include the margins that were likely positive from the previous resection. 07/10/2015 6:02 AM  Colleen Cohen  has presented today for surgery, with the diagnosis of LEFT BREAST CANCER  The various methods of treatment have been discussed with the patient and family. After consideration of risks, benefits and other options for treatment, the patient has consented to  Procedure(s): RE-EXCISION OF LEFT  BREAST LUMPECTOMY (NO SENTINEL NODE)  (Left) as a surgical intervention .  The patient's history has been reviewed, patient examined, no change in status, stable for surgery.  I have reviewed the patient's chart and labs.  Questions were answered to the patient's satisfaction.     Adam Demary

## 2015-07-10 NOTE — Transfer of Care (Signed)
Immediate Anesthesia Transfer of Care Note  Patient: Colleen Cohen  Procedure(s) Performed: Procedure(s): RE-EXCISION OF LEFT BREAST LUMPECTOMY  (Left) REVISION OF LEFT BREAST REDUCTION (Left)  Patient Location: PACU  Anesthesia Type:General  Level of Consciousness: awake, alert , oriented and patient cooperative  Airway & Oxygen Therapy: Patient Spontanous Breathing and Patient connected to nasal cannula oxygen  Post-op Assessment: Report given to RN, Post -op Vital signs reviewed and stable, Patient moving all extremities X 4 and Patient able to stick tongue midline  Post vital signs: Reviewed and stable  Last Vitals:  Filed Vitals:   07/10/15 0710 07/10/15 1025  BP: 119/73 123/50  Pulse: 58 84  Temp: 36.7 C 36.7 C  Resp: 18 11    Last Pain: There were no vitals filed for this visit.    Patients Stated Pain Goal: 1 ([id number])  Complications: No apparent anesthesia complications

## 2015-07-11 ENCOUNTER — Encounter (HOSPITAL_COMMUNITY): Payer: Self-pay | Admitting: General Surgery

## 2015-07-24 ENCOUNTER — Encounter (HOSPITAL_COMMUNITY): Payer: BLUE CROSS/BLUE SHIELD | Attending: Hematology & Oncology | Admitting: Hematology & Oncology

## 2015-07-24 ENCOUNTER — Encounter (HOSPITAL_COMMUNITY): Payer: Self-pay | Admitting: Hematology & Oncology

## 2015-07-24 VITALS — BP 143/57 | HR 67 | Temp 98.6°F | Resp 18 | Ht 65.0 in | Wt 247.1 lb

## 2015-07-24 DIAGNOSIS — Z170 Estrogen receptor positive status [ER+]: Secondary | ICD-10-CM

## 2015-07-24 DIAGNOSIS — C773 Secondary and unspecified malignant neoplasm of axilla and upper limb lymph nodes: Secondary | ICD-10-CM

## 2015-07-24 DIAGNOSIS — Z9012 Acquired absence of left breast and nipple: Secondary | ICD-10-CM

## 2015-07-24 DIAGNOSIS — C50412 Malignant neoplasm of upper-outer quadrant of left female breast: Secondary | ICD-10-CM

## 2015-07-24 DIAGNOSIS — Z9889 Other specified postprocedural states: Secondary | ICD-10-CM

## 2015-07-24 NOTE — Progress Notes (Signed)
[facility] NOTE  Patient Care Team: [name], MD as PCP - General (Family Medicine)  CHIEF COMPLAINTS/PURPOSE OF CONSULTATION:     Breast cancer ([place])   05/05/2015 Procedure Left needle core biopsy   05/05/2015 Pathology Results Breast, left, needle core biopsy, 2:00 o'clock - INVASIVE AND IN SITU MAMMARY CARCINOMA WITH CALCIFICATIONS.   06/03/2015 Procedure Left breast biopsy   06/03/2015 Pathology Results Breast, left, needle core biopsy, central - ATYPICAL DUCTAL HYPERPLASIA.   06/17/2015 Pathology Results Breast, partial mastectomy, Left, upper outer quadrant with seed and needle loc - MULTIFOCAL INVASIVE GRADE II LOBULAR CARCINOMA, TWO FOCI MEASURING 1.8 CM AND 1.7 CM IN GREATEST DIMENSION.   06/17/2015 Procedure Left partial mastectomy by Dr. Judeth Horn   06/17/2015 Initial Diagnosis Breast cancer ([place])   07/10/2015 Procedure Left breast re-excision by Dr. Marla Roe   07/10/2015 Pathology Results Breast, excision, Left Re-excision - BENIGN BREAST TISSUE WITH BIOPSY CHANGES. - NO RESIDUAL TUMOR. - FINAL MARGINS CLEAR.Breast, left, needle core biopsy, outer - INVASIVE LOBULAR CARCINOMA.        Stage IIA (T1C(2)N1AM0) breast cancer, T1 tumor, 1 node greater than 85m of mets; multifocal, ER+, PR +/-, HER2-, lobular carcinoma.  HISTORY OF PRESENTING ILLNESS:  DChesnee Floren65y.o. female is here because of Stage IIA (T1c(m)N1aM0) breast cancer, T1 tumor, 1 lymph node involved with metastatic disease with extracapsular extension; multifocal, ER+, PR +/-, HER2-, lobular carcinoma. She comes here today with her husband and her 65year-old granddaughter.  When asked if she's doing okay, Mrs. CLamoreauxsays "I probably wouldn't be here if I was okay."   During her historical disclosure, she notes one sister who was a nurse who had breast cancer at age 65 Her sister had radiation treatment, and took "the pills" for 5 years. Mrs. CSeyboldnotes that her sister did  very well with treatment. Mrs. CCaplinconfirms that she gets mammograms every year, ordered by her gynecologist. She says she was probably in her early 89'swhen she went through menopause. She did not have a lot of hot flashes, noting that she did pretty well with menopause.  Regarding the story of her current diagnosis, she went for her mammogram in March. They called her back 2 days later and said they wanted her to come in for an ultrasound for something that wasn't there last year. After that, they wanted her to come in for a biopsy. Mrs. CNarinesays "I think they kind of blew that; they took all fat out and couldn't do anything with it." [facility], and reports that the process started all over again, "with all the tests."  In addition to this, Mrs. CTriasnotes that she's always been very large breasted. She had talked about a breast reduction for years prior to her diagnosis, due to her problems with pain and headaches. She wasn't sure if she wanted to go through it since she'd already had her breasts so long.  She was suggested to have a breast reduction along with her other procedures, that's how she ended up with Dr. DMarla Roe She notes "I finally decided to do it, and I wish I'd never done it thanks to all of the problems I'm having now" with her breast. She says "the incisions look like something's just eating it up." She saw Shawn on Tuesday, noting that she had been applying things as prescribed to her incisions, but when she saw Dr. WHulen Skainsagain,  he said "has she talked to you about another surgery?" She found Dr. Hulen Skains because he did her sister's surgery, and she was very happy with him.   Mrs. Karg confirms that, in terms of a treatment plan, she was told that she should have radiation, and 5 years of an aromatase inhibitor. When asked if the other consulting doctors spoke to her about lymph nodes or staging, she says "I read on the paper that it was  stage II, and that Raquel Sarna said all of the margins were clear after her second surgery." She says "that's I guess all I know."   She is seeing Dr. Sondra Come for radiation.   During the physical exam, her R breast is examined. The patient remarks that it is burning and throbbing. She says that the nipple is black, and that "the pain in that breast is killing me."   Short of the complications after her breast reduction surgery, she says she feels she's been handling her diagnosis "pretty good." She confirms that she's gotten regular mammograms for 25 years at the obgyn.  During the physical exam, the patient notes that she had detached retina surgery in her R eye, a year and a half ago, at [facility] with Dr. Myles Gip. She is going to be seeing Dr. Sondra Come in [place] for radiation. She denies any problems in her mouth.  Toward the end of her appointment, she confirms that she's had a bone density scan.   MEDICAL HISTORY:  Past Medical History  Diagnosis Date  . Hypertension   . GERD (gastroesophageal reflux disease)   . Anxiety   . Depression   . Hypothyroidism   . Nephrolithiasis   . Myopia of both eyes   . PONV (postoperative nausea and vomiting)   . Migraine   . Wears dentures   . Arthritis   . Cancer [facility])     breast cancer    SURGICAL HISTORY: Past Surgical History  Procedure Laterality Date  . Cholecystectomy    . Back surgery    . Thyroidectomy    . Partial nephrectomy    . Shoulder arthroscopy    . Cystoscopy w/ ureteral stent placement    . Breast lumpectomy with radioactive seed and sentinel lymph node biopsy Left 06/17/2015    Procedure: LEFT BREAST LUMPECTOMY WITH RADIOACTIVE SEED AND SENTINEL LYMPH NODE BIOPSY, NEEDLE LOCALIZATION IN ATYPICAL DUCTAL HYPERPLASIA  AREA OF LEFT BREAST;  Surgeon: Judeth Horn, MD;  Location: [place];  Service: General;  Laterality: Left;  . Breast reduction surgery Bilateral 06/17/2015    Procedure: BILATERAL BREAST  REDUCTION  WITH  RECONSTRUCTION  ON LEFT  AFTER PARTIAL MASTECTOMY;  Surgeon: Wallace Going, DO;  Location: [place];  Service: Plastics;  Laterality: Bilateral;  . Re-excision of breast lumpectomy Left 07/10/2015    Procedure: RE-EXCISION OF LEFT BREAST LUMPECTOMY ;  Surgeon: Judeth Horn, MD;  Location: [place];  Service: General;  Laterality: Left;  . Breast reconstruction Left 07/10/2015    Procedure: REVISION OF LEFT BREAST REDUCTION;  Surgeon: Loel Lofty Dillingham, DO;  Location: [place];  Service: Plastics;  Laterality: Left;    SOCIAL HISTORY: Social History   Social History  . Marital Status: Married    Spouse Name: N/A  . Number of Children: N/A  . Years of Education: N/A   Occupational History  . Not on file.   Social History Main Topics  . Smoking status: Never Smoker   . Smokeless tobacco: Never  Used  . Alcohol Use: Yes     Comment: rare  . Drug Use: No  . Sexual Activity: Not on file   Other Topics Concern  . Not on file   Social History Narrative   Married 16 years. 9 children. Her husband has 2 children, 1 grandchild. 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 Born in [place].  Hobbies include bowling, travelling, shopping. Never smoker. No alcohol problems. Has cats; 2 female Persians.  FAMILY HISTORY: History reviewed. No pertinent family history.  Mother and father both passed away at age 5. Mother had kidney cancer and Alzheimer's Father had emphysema; was a heavy smoker. 1 sister who was a Marine scientist; had breast cancer at age 59. She had radiation treatment, and took the pills for 5 years.  ALLERGIES:  is allergic to bactrim; vancomycin; and codeine.  MEDICATIONS:  Current Outpatient Prescriptions  Medication Sig Dispense Refill  . diazepam (VALIUM) 2 MG tablet Take 1 tablet (2 mg total) by mouth every 12 (twelve) hours as needed for muscle spasms. 30 tablet 0  . hydrochlorothiazide (HYDRODIURIL) 25 MG tablet Take 25 mg  by mouth daily.    Marland Kitchen HYDROcodone-acetaminophen (NORCO) 5-325 MG tablet Take 1 tablet by mouth every 6 (six) hours as needed for moderate pain. 30 tablet 0  . levothyroxine (SYNTHROID, LEVOTHROID) 137 MCG tablet Take 137 mcg by mouth daily before breakfast.    . metoprolol succinate (TOPROL-XL) 50 MG 24 hr tablet Take 50 mg by mouth daily. Take with or immediately following a meal.    . topiramate (TOPAMAX) 100 MG tablet Take 100 mg by mouth at bedtime.  1  . zolpidem (AMBIEN) 10 MG tablet Take 10 mg by mouth at bedtime as needed for sleep.    Marland Kitchen docusate sodium (COLACE) 100 MG capsule Take 1 capsule (100 mg total) by mouth 2 (two) times daily. (Patient not taking: Reported on 07/07/2015) 10 capsule 0  . HYDROcodone-acetaminophen (NORCO/VICODIN) 5-325 MG tablet Take 1-2 tablets by mouth every 4 (four) hours as needed for moderate pain. (Patient not taking: Reported on 07/24/2015) 40 tablet 0  . oxyCODONE (OXY IR/ROXICODONE) 5 MG immediate release tablet Take 5 mg by mouth daily as needed.  0  . Probiotic Product (RA PROBIOTIC GUMMIES PO) Take 1 each by mouth daily.     No current facility-administered medications for this visit.    Review of Systems  Constitutional: Negative.        Post-surgical pain and healing issues in her R breast.  HENT: Negative.   Eyes: Negative.   Respiratory: Negative.   Cardiovascular: Negative.   Gastrointestinal: Negative.   Genitourinary: Negative.   Musculoskeletal: Negative.   Skin: Negative.   Neurological: Negative.   Endo/Heme/Allergies: Negative.   Psychiatric/Behavioral: Negative.   All other systems reviewed and are negative. 14 point ROS was done and is otherwise as detailed above or in HPI  PHYSICAL EXAMINATION: ECOG PERFORMANCE STATUS: 1 - Symptomatic but completely ambulatory  Filed Vitals:   07/24/15 1513  BP: 143/57  Pulse: 67  Temp: 98.6 F (37 C)  Resp: 18   Filed Weights   07/24/15 1513  Weight: 247 lb 1.6 oz (112.084 kg)     Physical Exam  Constitutional: She is oriented to person, place, and time and well-developed, well-nourished, and in no distress.  HENT:  Head: Normocephalic and atraumatic.  Nose: Nose normal.  Mouth/Throat: Oropharynx is clear and moist. No oropharyngeal exudate.  Eyes: Conjunctivae and EOM  are normal. Pupils are equal, round, and reactive to light. Right eye exhibits no discharge. Left eye exhibits no discharge. No scleral icterus.  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  Neck: Normal range of motion. Neck supple. No tracheal deviation present. No thyromegaly present.  Cardiovascular: Normal rate, regular rhythm, normal heart sounds and intact distal pulses.  Exam reveals no gallop and no friction rub.   No murmur heard. Pulmonary/Chest: Effort normal and breath sounds normal. She has no wheezes. She has no rales.  Wound healing complications noted in the R breast.  Abdominal: Soft. Bowel sounds are normal. She exhibits no distension and no mass. There is no tenderness. There is no rebound and no guarding.  Musculoskeletal: Normal range of motion. She exhibits no edema or tenderness.  Lymphadenopathy:    She has no cervical adenopathy.  Neurological: She is alert and oriented to person, place, and time. She has normal reflexes. No cranial nerve deficit. Gait normal. Coordination normal.  Skin: Skin is warm and dry. No rash noted.  Psychiatric: Mood, memory, affect and judgment normal.  Nursing note and vitals reviewed.   LABORATORY DATA:  I have reviewed the data as listed  Results for OVIYA, AMMAR (MRN [id number])   Ref. Range 07/10/2015 07:53  Sodium Latest Ref Range: 135-145 mmol/L 141  Potassium Latest Ref Range: 3.5-5.1 mmol/L 4.3  Chloride Latest Ref Range: 101-111 mmol/L 103  CO2 Latest Ref Range: 22-32 mmol/L 28  BUN Latest Ref Range: 6-20 mg/dL 16  Creatinine Latest Ref Range: 0.44-1.00 mg/dL 0.97  Calcium Latest Ref Range: 8.9-10.3 mg/dL  9.3  EGFR (Non-African Amer.) Latest Ref Range: >60 mL/min >60  EGFR (African American) Latest Ref Range: >60 mL/min >60  Glucose Latest Ref Range: 65-99 mg/dL 105 (H)  Anion gap Latest Ref Range: 5-15  10  WBC Latest Ref Range: 4.0-10.5 K/uL 7.6  RBC Latest Ref Range: 3.87-5.11 MIL/uL 4.27  Hemoglobin Latest Ref Range: 12.0-15.0 g/dL 10.7 (L)  HCT Latest Ref Range: 36.0-46.0 % 35.7 (L)  MCV Latest Ref Range: 78.0-100.0 fL 83.6  MCH Latest Ref Range: 26.0-34.0 pg 25.1 (L)  MCHC Latest Ref Range: 30.0-36.0 g/dL 30.0  RDW Latest Ref Range: 11.5-15.5 % 13.6  Platelets Latest Ref Range: 150-400 K/uL 330  Neutrophils Latest Units: % 54  Lymphocytes Latest Units: % 35  Monocytes Relative Latest Units: % 6  Eosinophil Latest Units: % 5  Basophil Latest Units: % 0  NEUT# Latest Ref Range: 1.7-7.7 K/uL 4.0  Lymphocyte # Latest Ref Range: 0.7-4.0 K/uL 2.7  Monocyte # Latest Ref Range: 0.1-1.0 K/uL 0.5  Eosinophils Absolute Latest Ref Range: 0.0-0.7 K/uL 0.4  Basophils Absolute Latest Ref Range: 0.0-0.1 K/uL 0.0     PATHOLOGY  Study Result     CLINICAL DATA: Status post left breast lumpectomy and reduction. Patient had preoperative localization using a single radioactive seed and 2 modified Kopan's wires.  EXAM: SPECIMEN RADIOGRAPH OF THE LEFT BREAST  COMPARISON: Previous exam(s).  FINDINGS: Status post excision of the left breast. The radioactive seed and Heart shaped biopsy marker clip are present, completely intact, and were marked for pathology. Additionally, the excised breast tissue includes the 2 modified Kopan's wires, cylinder-shaped clip, dumbbell-shaped clip, and twist shaped clip.  IMPRESSION: Specimen radiograph of the left breast.   Electronically Signed  By: Nolon Nations M.D.  On: 06/17/2015 15:44            RADIOGRAPHIC STUDIES: I have personally reviewed the radiological images  as listed and agreed with the findings in the  report. No results found.  ASSESSMENT & PLAN:  Stage IIA (T1C(2)N1AM0) breast cancer, multifocal, ER+, PR +/-, HER2-, lobular carcinoma. Left upper outer quadrant B/L Breast reduction Post surgical complications  She had two sites of disease, making it multifocal. She has macroscopic LN involvement with extracapsular extension. The majority of her appointment was spent discussing her diagnosis, staging, and disease in detail. We also discussed the NCCN guidelines for treatment. We reviewed the importance of radiation and adjuvant aromatase inhibitor therapy to treat her breast cancer, and reviewed the receptor positivity of her cancer. We discussed timing of therapy, generally surgery, chemotherapy ( if needed), radiation and then endocrine therapy.  I have recommended sending a mammaprint to assess benefit from chemotherapy. It will take approximately 2 weeks to return. Given her current would healing difficulties if it returns high risk we will have to discuss with Dr. Marla Roe how to coordinate her care. I will also discuss with Dr. Hulen Skains.   She would like a copy of her pathology report. She thinks she has a cancer policy. I have advised her to let us know what other information she may need.   Mammoprint has been ordered.  We will call the patient when results are available to set-up follow-up and discuss results.  She was provided with the NCCN guidelines for ER+ breast cancer and in addition the breast cancer navigation book. She was instructed to call in the interim with questions.   All questions were answered. The patient knows to call the clinic with any problems, questions or concerns.  This document serves as a record of services personally performed by Ancil Linsey, MD. It was created on her behalf by Toni Amend, a trained medical scribe. The creation of this record is based on the scribe's personal observations and the provider's statements to them. This document has  been checked and approved by the attending provider.  I have reviewed the above documentation for accuracy and completeness and I agree with the above.  This note was electronically signed.     Molli Hazard, MD

## 2015-08-01 ENCOUNTER — Encounter (HOSPITAL_COMMUNITY): Payer: Self-pay | Admitting: Emergency Medicine

## 2015-08-01 ENCOUNTER — Emergency Department (HOSPITAL_COMMUNITY): Payer: BLUE CROSS/BLUE SHIELD

## 2015-08-01 ENCOUNTER — Inpatient Hospital Stay (HOSPITAL_COMMUNITY)
Admission: EM | Admit: 2015-08-01 | Discharge: 2015-08-04 | DRG: 863 | Disposition: A | Discharge status: Home / Self Care | Payer: BLUE CROSS/BLUE SHIELD | Attending: Plastic Surgery | Admitting: Plastic Surgery

## 2015-08-01 DIAGNOSIS — Z853 Personal history of malignant neoplasm of breast: Secondary | ICD-10-CM

## 2015-08-01 DIAGNOSIS — T814XXA Infection following a procedure, initial encounter: Secondary | ICD-10-CM | POA: Diagnosis present

## 2015-08-01 DIAGNOSIS — F329 Major depressive disorder, single episode, unspecified: Secondary | ICD-10-CM | POA: Diagnosis present

## 2015-08-01 DIAGNOSIS — N644 Mastodynia: Secondary | ICD-10-CM | POA: Diagnosis present

## 2015-08-01 DIAGNOSIS — S21001A Unspecified open wound of right breast, initial encounter: Secondary | ICD-10-CM

## 2015-08-01 DIAGNOSIS — IMO0001 Reserved for inherently not codable concepts without codable children: Secondary | ICD-10-CM

## 2015-08-01 DIAGNOSIS — E039 Hypothyroidism, unspecified: Secondary | ICD-10-CM | POA: Diagnosis present

## 2015-08-01 DIAGNOSIS — K219 Gastro-esophageal reflux disease without esophagitis: Secondary | ICD-10-CM | POA: Diagnosis present

## 2015-08-01 DIAGNOSIS — Z9889 Other specified postprocedural states: Secondary | ICD-10-CM

## 2015-08-01 DIAGNOSIS — N6320 Unspecified lump in the left breast, unspecified quadrant: Secondary | ICD-10-CM

## 2015-08-01 DIAGNOSIS — S21009A Unspecified open wound of unspecified breast, initial encounter: Secondary | ICD-10-CM | POA: Diagnosis present

## 2015-08-01 DIAGNOSIS — I10 Essential (primary) hypertension: Secondary | ICD-10-CM | POA: Diagnosis present

## 2015-08-01 DIAGNOSIS — Z9012 Acquired absence of left breast and nipple: Secondary | ICD-10-CM

## 2015-08-01 DIAGNOSIS — Z23 Encounter for immunization: Secondary | ICD-10-CM

## 2015-08-01 LAB — COMPREHENSIVE METABOLIC PANEL
ALK PHOS: 58 U/L (ref 38–126)
ALT: 15 U/L (ref 14–54)
ANION GAP: 8 (ref 5–15)
AST: 17 U/L (ref 15–41)
Albumin: 3.6 g/dL (ref 3.5–5.0)
BILIRUBIN TOTAL: 0.5 mg/dL (ref 0.3–1.2)
BUN: 10 mg/dL (ref 6–20)
CALCIUM: 9.3 mg/dL (ref 8.9–10.3)
CO2: 25 mmol/L (ref 22–32)
CREATININE: 0.87 mg/dL (ref 0.44–1.00)
Chloride: 104 mmol/L (ref 101–111)
Glucose, Bld: 99 mg/dL (ref 65–99)
Potassium: 3.4 mmol/L — ABNORMAL LOW (ref 3.5–5.1)
Sodium: 137 mmol/L (ref 135–145)
TOTAL PROTEIN: 6.9 g/dL (ref 6.5–8.1)

## 2015-08-01 LAB — URINALYSIS, ROUTINE W REFLEX MICROSCOPIC
BILIRUBIN URINE: NEGATIVE
GLUCOSE, UA: NEGATIVE mg/dL
HGB URINE DIPSTICK: NEGATIVE
KETONES UR: NEGATIVE mg/dL
NITRITE: NEGATIVE
PH: 5 (ref 5.0–8.0)
Protein, ur: NEGATIVE mg/dL
SPECIFIC GRAVITY, URINE: 1.016 (ref 1.005–1.030)

## 2015-08-01 LAB — CBC WITH DIFFERENTIAL/PLATELET
BASOS PCT: 0 %
Basophils Absolute: 0 10*3/uL (ref 0.0–0.1)
Eosinophils Absolute: 0.3 10*3/uL (ref 0.0–0.7)
Eosinophils Relative: 5 %
HEMATOCRIT: 36.7 % (ref 36.0–46.0)
HEMOGLOBIN: 11.2 g/dL — AB (ref 12.0–15.0)
LYMPHS ABS: 2.6 10*3/uL (ref 0.7–4.0)
Lymphocytes Relative: 40 %
MCH: 25.1 pg — AB (ref 26.0–34.0)
MCHC: 30.5 g/dL (ref 30.0–36.0)
MCV: 82.3 fL (ref 78.0–100.0)
MONO ABS: 0.4 10*3/uL (ref 0.1–1.0)
MONOS PCT: 6 %
NEUTROS PCT: 49 %
Neutro Abs: 3.2 10*3/uL (ref 1.7–7.7)
Platelets: 306 10*3/uL (ref 150–400)
RBC: 4.46 MIL/uL (ref 3.87–5.11)
RDW: 14.1 % (ref 11.5–15.5)
WBC: 6.6 10*3/uL (ref 4.0–10.5)

## 2015-08-01 LAB — I-STAT TROPONIN, ED: TROPONIN I, POC: 0 ng/mL (ref 0.00–0.08)

## 2015-08-01 LAB — URINE MICROSCOPIC-ADD ON: RBC / HPF: NONE SEEN RBC/hpf (ref 0–5)

## 2015-08-01 LAB — I-STAT CG4 LACTIC ACID, ED
LACTIC ACID, VENOUS: 2 mmol/L — AB (ref 0.5–1.9)
LACTIC ACID, VENOUS: 1.26 mmol/L (ref 0.5–1.9)

## 2015-08-01 LAB — I-STAT BETA HCG BLOOD, ED (MC, WL, AP ONLY): I-stat hCG, quantitative: 11.3 m[IU]/mL — ABNORMAL HIGH (ref <5)

## 2015-08-01 MED ORDER — VANCOMYCIN HCL IN DEXTROSE 1-5 GM/200ML-% IV SOLN: 1000.0000 mg | Freq: Two times a day (BID) | INTRAVENOUS | Status: DC

## 2015-08-01 MED ORDER — POLYETHYLENE GLYCOL 3350 17 G PO PACK
10.0000 g | PACK | Freq: Every day | ORAL | Status: DC
  Filled 2015-08-01 (×2): qty 1

## 2015-08-01 MED ORDER — ONDANSETRON HCL 4 MG/2ML IJ SOLN: 4.0000 mg | Freq: Four times a day (QID) | INTRAMUSCULAR | Status: DC | PRN

## 2015-08-01 MED ORDER — METOCLOPRAMIDE HCL 5 MG/ML IJ SOLN
10.0000 mg | Freq: Once | INTRAMUSCULAR | Status: AC
  Administered 2015-08-01: 10 mg via INTRAVENOUS
  Filled 2015-08-01: qty 2

## 2015-08-01 MED ORDER — HYDROMORPHONE HCL 1 MG/ML IJ SOLN
1.0000 mg | INTRAMUSCULAR | Status: DC | PRN
  Administered 2015-08-02 – 2015-08-04 (×6): 1 mg via INTRAVENOUS
  Filled 2015-08-01 (×6): qty 1

## 2015-08-01 MED ORDER — MORPHINE SULFATE (PF) 4 MG/ML IV SOLN
4.0000 mg | Freq: Once | INTRAVENOUS | Status: AC
  Administered 2015-08-01: 4 mg via INTRAVENOUS
  Filled 2015-08-01: qty 1

## 2015-08-01 MED ORDER — NAPROXEN 250 MG PO TABS: 500.0000 mg | ORAL_TABLET | Freq: Two times a day (BID) | ORAL | Status: DC | PRN

## 2015-08-01 MED ORDER — KCL IN DEXTROSE-NACL 20-5-0.45 MEQ/L-%-% IV SOLN
INTRAVENOUS | Status: DC
Start: 2015-08-01 — End: 2015-08-04
  Administered 2015-08-01 – 2015-08-04 (×5): via INTRAVENOUS
  Filled 2015-08-01 (×2): qty 1000

## 2015-08-01 MED ORDER — DIPHENHYDRAMINE HCL 12.5 MG/5ML PO ELIX
12.5000 mg | ORAL_SOLUTION | Freq: Four times a day (QID) | ORAL | Status: DC | PRN
  Administered 2015-08-02: 12.5 mg via ORAL
  Filled 2015-08-01: qty 10

## 2015-08-01 MED ORDER — DIPHENHYDRAMINE HCL 50 MG/ML IJ SOLN: 12.5000 mg | Freq: Four times a day (QID) | INTRAMUSCULAR | Status: DC | PRN

## 2015-08-01 MED ORDER — OXYCODONE-ACETAMINOPHEN 5-325 MG PO TABS
1.0000 | ORAL_TABLET | ORAL | Status: DC | PRN
  Administered 2015-08-04: 2 via ORAL
  Filled 2015-08-01: qty 2

## 2015-08-01 MED ORDER — SODIUM CHLORIDE 0.9 % IV BOLUS (SEPSIS)
1000.0000 mL | Freq: Once | INTRAVENOUS | Status: AC
  Administered 2015-08-01: 1000 mL via INTRAVENOUS

## 2015-08-01 MED ORDER — ENOXAPARIN SODIUM 30 MG/0.3ML ~~LOC~~ SOLN
30.0000 mg | SUBCUTANEOUS | Status: DC
  Administered 2015-08-01 – 2015-08-03 (×3): 30 mg via SUBCUTANEOUS
  Filled 2015-08-01 (×3): qty 0.3

## 2015-08-01 MED ORDER — METHOCARBAMOL 500 MG PO TABS: 500.0000 mg | ORAL_TABLET | Freq: Four times a day (QID) | ORAL | Status: DC | PRN

## 2015-08-01 MED ORDER — DIPHENHYDRAMINE HCL 50 MG/ML IJ SOLN
25.0000 mg | Freq: Once | INTRAMUSCULAR | Status: AC
  Administered 2015-08-01: 25 mg via INTRAVENOUS
  Filled 2015-08-01: qty 1

## 2015-08-01 MED ORDER — KCL IN DEXTROSE-NACL 20-5-0.45 MEQ/L-%-% IV SOLN
INTRAVENOUS | Status: DC
  Administered 2015-08-03: 06:00:00 via INTRAVENOUS
  Filled 2015-08-01 (×5): qty 1000

## 2015-08-01 MED ORDER — VANCOMYCIN HCL 10 G IV SOLR
2000.0000 mg | Freq: Once | INTRAVENOUS | Status: AC
  Administered 2015-08-01: 2000 mg via INTRAVENOUS
  Filled 2015-08-01: qty 2000

## 2015-08-01 MED ORDER — LINEZOLID 600 MG/300ML IV SOLN
600.0000 mg | Freq: Two times a day (BID) | INTRAVENOUS | Status: DC
  Administered 2015-08-01 – 2015-08-02 (×2): 600 mg via INTRAVENOUS
  Filled 2015-08-01 (×3): qty 300

## 2015-08-01 MED ORDER — ONDANSETRON 4 MG PO TBDP: 4.0000 mg | ORAL_TABLET | Freq: Four times a day (QID) | ORAL | Status: DC | PRN

## 2015-08-01 MED ORDER — ACETAMINOPHEN 500 MG PO TABS
1000.0000 mg | ORAL_TABLET | Freq: Four times a day (QID) | ORAL | Status: DC
  Administered 2015-08-01 – 2015-08-04 (×10): 1000 mg via ORAL
  Filled 2015-08-01 (×10): qty 2

## 2015-08-01 NOTE — ED Notes (Signed)
Pt. Complaining of itching all over and there is noticeable redness on chest and around hairline. Dr. Rex Kras notified and verbalized changing the rate to 189ml/hr for treatment

## 2015-08-01 NOTE — Consult Note (Signed)
Reason for Consult:Right breast infection, left breast mass Referring Physician: Dillingham  Colleen Cohen is an 65 y.o. female.  HPI: Patient seen in XRT, sent to ED because of spreading cellulitis of the reconstructed right breast.  Also the patient has a growing lump on the superior medial aspect of h er left breast.  Past Medical History  Diagnosis Date  . Hypertension   . GERD (gastroesophageal reflux disease)   . Anxiety   . Depression   . Hypothyroidism   . Nephrolithiasis   . Myopia of both eyes   . PONV (postoperative nausea and vomiting)   . Migraine   . Wears dentures   . Arthritis   . Cancer [facility])     breast cancer    Past Surgical History  Procedure Laterality Date  . Cholecystectomy    . Back surgery    . Thyroidectomy    . Partial nephrectomy    . Shoulder arthroscopy    . Cystoscopy w/ ureteral stent placement    . Breast lumpectomy with radioactive seed and sentinel lymph node biopsy Left 06/17/2015    Procedure: LEFT BREAST LUMPECTOMY WITH RADIOACTIVE SEED AND SENTINEL LYMPH NODE BIOPSY, NEEDLE LOCALIZATION IN ATYPICAL DUCTAL HYPERPLASIA  AREA OF LEFT BREAST;  Surgeon: Judeth Horn, MD;  Location: [place];  Service: General;  Laterality: Left;  . Breast reduction surgery Bilateral 06/17/2015    Procedure: BILATERAL BREAST  REDUCTION  WITH RECONSTRUCTION  ON LEFT  AFTER PARTIAL MASTECTOMY;  Surgeon: Wallace Going, DO;  Location: [place];  Service: Plastics;  Laterality: Bilateral;  . Re-excision of breast lumpectomy Left 07/10/2015    Procedure: RE-EXCISION OF LEFT BREAST LUMPECTOMY ;  Surgeon: Judeth Horn, MD;  Location: [place];  Service: General;  Laterality: Left;  . Breast reconstruction Left 07/10/2015    Procedure: REVISION OF LEFT BREAST REDUCTION;  Surgeon: Loel Lofty Dillingham, DO;  Location: [place];  Service: Plastics;  Laterality: Left;    No family history on file.  Social History:  reports that she has never  smoked. She has never used smokeless tobacco. She reports that she drinks alcohol. She reports that she does not use illicit drugs.  Allergies:  Allergies  Allergen Reactions  . Bactrim [Sulfamethoxazole-Trimethoprim] Other (See Comments)    Lactic acid increase  . Codeine Rash    Medications: I have reviewed the patient's current medications.  Results for orders placed or performed during the hospital encounter of 08/01/15 (from the past 48 hour(s))  Comprehensive metabolic panel     Status: Abnormal   Collection Time: 08/01/15  2:51 PM  Result Value Ref Range   Sodium 137 135 - 145 mmol/L   Potassium 3.4 (L) 3.5 - 5.1 mmol/L   Chloride 104 101 - 111 mmol/L   CO2 25 22 - 32 mmol/L   Glucose, Bld 99 65 - 99 mg/dL   BUN 10 6 - 20 mg/dL   Creatinine, Ser 0.87 0.44 - 1.00 mg/dL   Calcium 9.3 8.9 - 10.3 mg/dL   Total Protein 6.9 6.5 - 8.1 g/dL   Albumin 3.6 3.5 - 5.0 g/dL   AST 17 15 - 41 U/L   ALT 15 14 - 54 U/L   Alkaline Phosphatase 58 38 - 126 U/L   Total Bilirubin 0.5 0.3 - 1.2 mg/dL   GFR calc non Af Amer >60 >60 mL/min   GFR calc Af Amer >60 >60 mL/min    Comment: (NOTE) The eGFR has been  calculated using the CKD EPI equation. This calculation has not been validated in all clinical situations. eGFR's persistently <60 mL/min signify possible Chronic Kidney Disease.    Anion gap 8 5 - 15  CBC with Differential     Status: Abnormal   Collection Time: 08/01/15  2:51 PM  Result Value Ref Range   WBC 6.6 4.0 - 10.5 K/uL   RBC 4.46 3.87 - 5.11 MIL/uL   Hemoglobin 11.2 (L) 12.0 - 15.0 g/dL   HCT 36.7 36.0 - 46.0 %   MCV 82.3 78.0 - 100.0 fL   MCH 25.1 (L) 26.0 - 34.0 pg   MCHC 30.5 30.0 - 36.0 g/dL   RDW 14.1 11.5 - 15.5 %   Platelets 306 150 - 400 K/uL   Neutrophils Relative % 49 %   Neutro Abs 3.2 1.7 - 7.7 K/uL   Lymphocytes Relative 40 %   Lymphs Abs 2.6 0.7 - 4.0 K/uL   Monocytes Relative 6 %   Monocytes Absolute 0.4 0.1 - 1.0 K/uL   Eosinophils Relative 5 %     Eosinophils Absolute 0.3 0.0 - 0.7 K/uL   Basophils Relative 0 %   Basophils Absolute 0.0 0.0 - 0.1 K/uL  I-Stat beta hCG blood, ED     Status: Abnormal   Collection Time: 08/01/15  3:00 PM  Result Value Ref Range   I-stat hCG, quantitative 11.3 (H) <5 mIU/mL   Comment 3            Comment:   GEST. AGE      CONC.  (mIU/mL)   <=1 WEEK        5 - 50     2 WEEKS       50 - 500     3 WEEKS       100 - 10,000     4 WEEKS     1,000 - 30,000        FEMALE AND NON-PREGNANT FEMALE:     LESS THAN 5 mIU/mL   I-Stat CG4 Lactic Acid, ED     Status: Abnormal   Collection Time: 08/01/15  3:03 PM  Result Value Ref Range   Lactic Acid, Venous 2.00 (HH) 0.5 - 1.9 mmol/L   Comment NOTIFIED PHYSICIAN     No results found.  Review of Systems  Constitutional: Positive for fever and chills.  All other systems reviewed and are negative.  Blood pressure 160/70, pulse 67, temperature 99.2 F (37.3 C), temperature source Oral, resp. rate 18, SpO2 100 %. Physical Exam  Vitals reviewed. Constitutional: She is oriented to person, place, and time. She appears well-developed and well-nourished.  HENT:  Head: Normocephalic and atraumatic.  Eyes: EOM are normal. Pupils are equal, round, and reactive to light.  Neck: Normal range of motion. Neck supple.  Cardiovascular: Normal rate and regular rhythm.   Respiratory: Effort normal and breath sounds normal. Right breast exhibits nipple discharge (Drainage from necrotic area), skin change (lulitis of the right breast) and tenderness. Left breast exhibits mass (Superior medial aspect of the left breast, possible seroma). Left breast exhibits no nipple discharge, no skin change and no tenderness. Breasts are asymmetrical.    GI: Soft. Bowel sounds are normal.  Musculoskeletal: Normal range of motion.  Neurological: She is alert and oriented to person, place, and time. She has normal reflexes.  Psychiatric: She exhibits a depressed mood.     Assessment/Plan: Patient being admitted by the Plastic surgeon for cellulitis. I have recommended IV  Vanco.  She has a low grade fever, but a normal WBC.  I have also ordered an ultrasound of her left breast to help determine what the growth is medially and superiorly on that side.  We will follow along with the admitting service.  Deen Deguia 08/01/2015, 4:27 PM

## 2015-08-01 NOTE — ED Notes (Signed)
Noticeable welts above the IV site on the R arm.  Dr. Rex Kras notified and ordered benadryl. Infusion stopped No problems breathing reported by patient

## 2015-08-01 NOTE — ED Notes (Signed)
Attempted to call report

## 2015-08-01 NOTE — Progress Notes (Signed)
Pharmacy Antibiotic Note  Colleen Cohen is a 65 y.o. female admitted on 08/01/2015 with wound infxn.  Pharmacy has been consulted for vancomycin dosing. Tmax is 99.2 and WBC is WNL. Lactic acid is 2 and SCr is 0.87.  Plan: - Vancomycin 2gm IV x 1 then 1gm IV Q12H - F/u renal fxn, C&S, clinical status and trough at SS     Temp (24hrs), Avg:99.2 F (37.3 C), Min:99.2 F (37.3 C), Max:99.2 F (37.3 C)   Recent Labs Lab 08/01/15 1451 08/01/15 1503  WBC 6.6  --   CREATININE 0.87  --   LATICACIDVEN  --  2.00*    Estimated Creatinine Clearance: 81.5 mL/min (by C-G formula based on Cr of 0.87).    Allergies  Allergen Reactions  . Bactrim [Sulfamethoxazole-Trimethoprim] Other (See Comments)    Lactic acid increase  . Codeine Rash    Antimicrobials this admission: Vanc 7/7>>  Dose adjustments this admission: N/A  Microbiology results: Pending  Thank you for allowing pharmacy to be a part of this patient's care.  [facility], Rande Lawman 08/01/2015 4:01 PM

## 2015-08-01 NOTE — ED Notes (Signed)
Pt had reconstructive right breast surgery after cancer and states its infected. Was sent here by Dr. Marla Roe (plastic and reconstructive surgery). Pt c/o pain, redness, drainage. Pt taking bactrim. Told to come here for IV antibiotics. Pt in NAD.

## 2015-08-01 NOTE — ED Notes (Signed)
Charge nurse notified of lactic acid, will move patient back to 37 when clean.

## 2015-08-01 NOTE — ED Notes (Signed)
Pt unable to urinate at this time. Pt is aware urine is needed.

## 2015-08-01 NOTE — ED Provider Notes (Signed)
CSN: [id number]     Arrival date & time 08/01/15  1435 History   First MD Initiated Contact with Patient 08/01/15 1524     Chief Complaint  Patient presents with  . Post-op Problem  . Breast Pain     (Consider location/radiation/quality/duration/timing/severity/associated sxs/prior Treatment) HPI Comments: 65yo F w/ PMH including breast CA, HTN, GERD who p/w surgical site pain and drainage. Pt had reconstructive surgery on L breast and reduction on R breast several weeks ago. She has had some drainage from the surgical wounds on R since the surgery, but a few days ago she began having worsening redness and pain along with the drainage. She has been on bactrim for 1 day. She has not had any recent fevers. She has had nausea since the surgery but denies any vomiting. No abdominal pain. Currently, the pain in her right breast is 8/10 in intensity and constant. She was sent to the ED by her plastic surgeon.  The history is provided by the patient.    Past Medical History  Diagnosis Date  . Hypertension   . GERD (gastroesophageal reflux disease)   . Anxiety   . Depression   . Hypothyroidism   . Nephrolithiasis   . Myopia of both eyes   . PONV (postoperative nausea and vomiting)   . Migraine   . Wears dentures   . Arthritis   . Cancer [facility])     breast cancer   Past Surgical History  Procedure Laterality Date  . Cholecystectomy    . Back surgery    . Thyroidectomy    . Partial nephrectomy    . Shoulder arthroscopy    . Cystoscopy w/ ureteral stent placement    . 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 Breast reduction surgery Bilateral 06/17/2015    Procedure: BILATERAL  BREAST  REDUCTION  WITH RECONSTRUCTION  ON LEFT  AFTER PARTIAL MASTECTOMY;  Surgeon: Wallace Going, DO;  Location: [place];  Service: Plastics;  Laterality: Bilateral;  . Re-excision of breast lumpectomy Left 07/10/2015    Procedure: RE-EXCISION OF LEFT BREAST LUMPECTOMY ;  Surgeon: Judeth Horn, MD;  Location: [place];  Service: General;  Laterality: Left;  . Breast reconstruction Left 07/10/2015    Procedure: REVISION OF LEFT BREAST REDUCTION;  Surgeon: Loel Lofty Dillingham, DO;  Location: [place];  Service: Plastics;  Laterality: Left;   No family history on file. Social History  Substance Use Topics  . Smoking status: Never Smoker   . Smokeless tobacco: Never Used  . Alcohol Use: Yes     Comment: rare   OB History    No data available     Review of Systems 10 Systems reviewed and are negative for acute change except as noted in the HPI.    Allergies  Codeine  Home Medications   Prior to Admission medications   Medication Sig Start Date End Date Taking? Authorizing Provider  cephALEXin (KEFLEX) 500 MG capsule Take 1 capsule (500 mg total) by mouth 4 (four) times daily. Patient not taking: Reported on 07/07/2015 06/18/15   Shawn Montgomery [place]burn, PA-C  diazepam (VALIUM) 2 MG tablet Take 1 tablet (2 mg total) by mouth every 12 (  twelve) hours as needed for muscle spasms. 06/18/15   Shawn Montgomery Rayburn, PA-C  docusate sodium (COLACE) 100 MG capsule Take 1 capsule (100 mg total) by mouth 2 (two) times daily. Patient not taking: Reported on 07/07/2015 06/18/15   Shawn Montgomery Rayburn, PA-C  FLUoxetine (PROZAC) 20 MG capsule Take 20 mg by mouth daily.    Historical Provider, MD  hydrochlorothiazide (HYDRODIURIL) 25 MG tablet Take 25 mg by mouth daily. 06/11/15   Historical Provider, MD  HYDROcodone-acetaminophen (NORCO) 5-325 MG tablet Take 1 tablet by mouth every 6 (six) hours as needed for moderate pain. 07/10/15   Shawn Montgomery Rayburn, PA-C   HYDROcodone-acetaminophen (NORCO/VICODIN) 5-325 MG tablet Take 1-2 tablets by mouth every 4 (four) hours as needed for moderate pain. Patient not taking: Reported on 07/24/2015 06/18/15   Neta Mends Rayburn, PA-C  levothyroxine (SYNTHROID, LEVOTHROID) 137 MCG tablet Take 137 mcg by mouth daily before breakfast. 05/27/15   Historical Provider, MD  metoprolol succinate (TOPROL-XL) 50 MG 24 hr tablet Take 50 mg by mouth daily. Take with or immediately following a meal.    Historical Provider, MD  ondansetron (ZOFRAN-ODT) 4 MG disintegrating tablet Take 1 tablet (4 mg total) by mouth every 6 (six) hours as needed for nausea. 06/18/15   Shawn Montgomery Rayburn, PA-C  pantoprazole (PROTONIX) 40 MG tablet Take 40 mg by mouth daily.    Historical Provider, MD  phenazopyridine (PYRIDIUM) 200 MG tablet Take 200 mg by mouth 3 (three) times daily as needed for pain. Reported on 07/24/2015 04/03/15   Historical Provider, MD  polyethylene glycol (MIRALAX / GLYCOLAX) packet Take 17 g by mouth daily as needed for mild constipation. Patient not taking: Reported on 07/07/2015 06/18/15   Shawn Montgomery Rayburn, PA-C  sulfamethoxazole-trimethoprim (BACTRIM DS,SEPTRA DS) 800-160 MG tablet Take 1 tablet by mouth 2 (two) times daily. Patient not taking: Reported on 07/24/2015 07/10/15   Shawn Montgomery Rayburn, PA-C  topiramate (TOPAMAX) 100 MG tablet Take 100 mg by mouth at bedtime. 06/29/15   Historical Provider, MD  zolpidem (AMBIEN) 10 MG tablet Take 10 mg by mouth at bedtime as needed for sleep.    Historical Provider, MD   BP 160/70 mmHg  Pulse 67  Temp(Src) 99.2 F (37.3 C) (Oral)  Resp 18  SpO2 100% Physical Exam  Constitutional: She is oriented to person, place, and time. She appears well-developed and well-nourished. No distress.  HENT:  Head: Normocephalic and atraumatic.  Moist mucous membranes  Eyes: Conjunctivae are normal. Pupils are equal, round, and reactive to light.  Neck: Neck supple.   Cardiovascular: Normal rate, regular rhythm and normal heart sounds.   No murmur heard. Pulmonary/Chest: Effort normal and breath sounds normal.  Abdominal: Soft. Bowel sounds are normal. She exhibits no distension. There is no tenderness.  Musculoskeletal: She exhibits no edema.  Neurological: She is alert and oriented to person, place, and time.  Fluent speech  Skin: Skin is warm and dry.  R breast erythematous, warm, tender without focal fluctuance; small area of drainage on inframammary fold and areola; L breast non-tender, mildly firm to palpation  Psychiatric: She has a normal mood and affect. Judgment normal.  Nursing note and vitals reviewed. Chaperone was present during exam.   ED Course  Procedures (including critical care time) Labs Review Labs Reviewed  CBC WITH DIFFERENTIAL/PLATELET - Abnormal; Notable for the following:    Hemoglobin 11.2 (*)    MCH 25.1 (*)    All other components within normal limits  I-STAT BETA  HCG BLOOD, ED ([facility], [facility], [facility] ONLY) - Abnormal; Notable for the following:    I-stat hCG, quantitative 11.3 (*)    All other components within normal limits  I-STAT CG4 LACTIC ACID, ED - Abnormal; Notable for the following:    Lactic Acid, Venous 2.00 (*)    All other components within normal limits  CULTURE, BLOOD (ROUTINE X 2)  CULTURE, BLOOD (ROUTINE X 2)  URINE CULTURE  COMPREHENSIVE METABOLIC PANEL  URINALYSIS, ROUTINE W REFLEX MICROSCOPIC (NOT AT [facility])    Imaging Review No results found. I have personally reviewed and evaluated these lab results as part of my medical decision-making.   EKG Interpretation None     Medications  sodium chloride 0.9 % bolus 1,000 mL (not administered)  morphine 4 MG/ML injection 4 mg (not administered)  metoCLOPramide (REGLAN) injection 10 mg (not administered)  dextrose 5 % and 0.45 % NaCl with KCl 20 mEq/L infusion (not administered)  vancomycin (VANCOCIN) 2,000 mg in sodium chloride 0.9 % 500 mL IVPB (not  administered)    MDM   Final diagnoses:  Surgical wound infection, initial encounter   Patient with reconstructive breast surgery several weeks ago presents with a few days of worsening redness, pain, and drainage of right breast near surgical site. On exam, she was awake and alert, nontoxic and in no acute distress. Initial vital signs show T 99.2, BP 160/70, heart rate 67. Patient with focal tenderness, redness, and small amount of drainage on right breast wounds. Dr. Hulen Skains, the patient's primary surgeon, met patient in ED and will place orders for vancomycin and [place] of left breast as he has palpated a small fluid pocket, likely seroma. He is contacting her Psychiatric nurse, Dr. Marla Roe.   Labs show lactate weakly abnormal at 2.0, i-STAT beta hCG is 11.3 which is likely lab error vs related to cancer as patient is 64yo. WBC normal.   Gave IVF bolus, morphine, reglan, and vanc. Pt later developed erythema on scalp and scattered hives near IV site. Gave Benadryl, Stopped vanc and per discussion w/ pharmacy, ordered linezolid for MRSA coverage. I discussed admission with Dr. Marla Roe and patient admitted for further treatment.  Sharlett Iles, MD 08/02/15 [phone]

## 2015-08-01 NOTE — H&P (Signed)
Colleen Cohen is an 65 y.o. female.   Chief Complaint: right breast wound HPI: The patient is a 65 yrs old wf here for treatment of a tender red right breast.  She underwent a left partial mastectomy last month.  The margins were positive so a repeat excision was done.  The left side is healing well.  There is mild fat necrosis at the 9 o'clock position.  The right breast has some necrosis of the right breast but is stable.  The lower portion of the breast is red and tender.  She was seen yesterday and bactrim was started.  The redness got worse over the morning and she returned to the office.  She is not febrile but scared and tearful.  The redness is more widespread and the area more tender.  Past Medical History  Diagnosis Date  . Hypertension   . GERD (gastroesophageal reflux disease)   . Anxiety   . Depression   . Hypothyroidism   . Nephrolithiasis   . Myopia of both eyes   . PONV (postoperative nausea and vomiting)   . Migraine   . Wears dentures   . Arthritis   . Cancer [facility])     breast cancer    Past Surgical History  Procedure Laterality Date  . Cholecystectomy    . Back surgery    . Thyroidectomy    . Partial nephrectomy    . Shoulder arthroscopy    . Cystoscopy w/ ureteral stent placement    . 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 Breast reduction surgery Bilateral 06/17/2015    Procedure: BILATERAL BREAST  REDUCTION  WITH RECONSTRUCTION  ON LEFT  AFTER PARTIAL MASTECTOMY;  Surgeon: Wallace Going, DO;  Location: [place];  Service: Plastics;  Laterality: Bilateral;  . Re-excision of breast lumpectomy Left 07/10/2015    Procedure:  RE-EXCISION OF LEFT BREAST LUMPECTOMY ;  Surgeon: Judeth Horn, MD;  Location: [place];  Service: General;  Laterality: Left;  . Breast reconstruction Left 07/10/2015    Procedure: REVISION OF LEFT BREAST REDUCTION;  Surgeon: Loel Lofty Quiana Cobaugh, DO;  Location: [place];  Service: Plastics;  Laterality: Left;    History reviewed. No pertinent family history. Social History:  reports that she has never smoked. She has never used smokeless tobacco. She reports that she drinks alcohol. She reports that she does not use illicit drugs.  Allergies:  Allergies  Allergen Reactions  . Bactrim [Sulfamethoxazole-Trimethoprim] Other (See Comments)    Lactic acid increase  . Codeine Rash     (Not in a hospital admission)  Results for orders placed or performed during the hospital encounter of 08/01/15 (from the past 48 hour(s))  Comprehensive metabolic panel     Status: Abnormal   Collection Time: 08/01/15  2:51 PM  Result Value Ref Range   Sodium 137 135 - 145 mmol/L   Potassium 3.4 (L) 3.5 - 5.1 mmol/L   Chloride 104 101 - 111 mmol/L   CO2 25 22 - 32 mmol/L   Glucose, Bld 99 65 - 99 mg/dL   BUN 10 6 - 20 mg/dL   Creatinine, Ser 0.87 0.44 - 1.00 mg/dL   Calcium 9.3 8.9 -  10.3 mg/dL   Total Protein 6.9 6.5 - 8.1 g/dL   Albumin 3.6 3.5 - 5.0 g/dL   AST 17 15 - 41 U/L   ALT 15 14 - 54 U/L   Alkaline Phosphatase 58 38 - 126 U/L   Total Bilirubin 0.5 0.3 - 1.2 mg/dL   GFR calc non Af Amer >60 >60 mL/min   GFR calc Af Amer >60 >60 mL/min    Comment: (NOTE) The eGFR has been calculated using the CKD EPI equation. This calculation has not been validated in all clinical situations. eGFR's persistently <60 mL/min signify possible Chronic Kidney Disease.    Anion gap 8 5 - 15  CBC with Differential     Status: Abnormal   Collection Time: 08/01/15  2:51 PM  Result Value Ref Range   WBC 6.6 4.0 - 10.5 K/uL   RBC 4.46 3.87 - 5.11 MIL/uL   Hemoglobin 11.2 (L) 12.0 - 15.0 g/dL   HCT 36.7 36.0 - 46.0  %   MCV 82.3 78.0 - 100.0 fL   MCH 25.1 (L) 26.0 - 34.0 pg   MCHC 30.5 30.0 - 36.0 g/dL   RDW 14.1 11.5 - 15.5 %   Platelets 306 150 - 400 K/uL   Neutrophils Relative % 49 %   Neutro Abs 3.2 1.7 - 7.7 K/uL   Lymphocytes Relative 40 %   Lymphs Abs 2.6 0.7 - 4.0 K/uL   Monocytes Relative 6 %   Monocytes Absolute 0.4 0.1 - 1.0 K/uL   Eosinophils Relative 5 %   Eosinophils Absolute 0.3 0.0 - 0.7 K/uL   Basophils Relative 0 %   Basophils Absolute 0.0 0.0 - 0.1 K/uL  I-Stat beta hCG blood, ED     Status: Abnormal   Collection Time: 08/01/15  3:00 PM  Result Value Ref Range   I-stat hCG, quantitative 11.3 (H) <5 mIU/mL   Comment 3            Comment:   GEST. AGE      CONC.  (mIU/mL)   <=1 WEEK        5 - 50     2 WEEKS       50 - 500     3 WEEKS       100 - 10,000     4 WEEKS     1,000 - 30,000        FEMALE AND NON-PREGNANT FEMALE:     LESS THAN 5 mIU/mL   I-Stat CG4 Lactic Acid, ED     Status: Abnormal   Collection Time: 08/01/15  3:03 PM  Result Value Ref Range   Lactic Acid, Venous 2.00 (HH) 0.5 - 1.9 mmol/L   Comment NOTIFIED PHYSICIAN    US Breast Ltd Uni Left Inc Axilla  08/01/2015  CLINICAL DATA:  Palpable mass in the medial left breast. Clinical concern for a postoperative seroma. The patient had a left lumpectomy for breast cancer on 06/17/2015 and bilateral breast reduction on 07/10/2015. EXAM: ULTRASOUND OF THE LEFT BREAST COMPARISON:  Previous exam(s). FINDINGS: Targeted ultrasound is performed, showing heterogeneous echogenic tissue with interspersed mild hypoechoic areas in the 9 o'clock position of the left breast, 4-6 cm from the nipple, at the location of the palpable mass. This has poorly defined margins, measuring approximately 4.0 x 3.7 x 2.6 cm in maximum dimensions. This has a convex anterior margin. No fluid collection is seen. IMPRESSION: Heterogeneous, primarily echogenic, mass-like area in the 9 o'clock position of the  left breast, at the location of the palpable  mass. Differential considerations include a large area of fat necrosis or infection. There was no evidence of malignancy in this region on the preoperative MR dated 05/27/2015. RECOMMENDATION: Left diagnostic mammogram and possible repeat ultrasound at that time as well as clinical follow-up. I have discussed the findings and recommendations with the patient. Results were also provided in writing at the conclusion of the visit. If applicable, a reminder letter will be sent to the patient regarding the next appointment. BI-RADS CATEGORY  0: Incomplete. Need additional imaging evaluation and/or prior mammograms for comparison. Electronically Signed   By: Claudie Revering M.D.   On: 08/01/2015 17:57    Review of Systems  Constitutional: Negative.   HENT: Negative.   Eyes: Negative.   Respiratory: Negative.   Cardiovascular: Negative.   Gastrointestinal: Negative.   Genitourinary: Negative.   Musculoskeletal: Negative.   Skin: Negative.   Psychiatric/Behavioral: Negative.     Blood pressure 160/70, pulse 67, temperature 99.2 F (37.3 C), temperature source Oral, resp. rate 18, SpO2 100 %. Physical Exam  Constitutional: She is oriented to person, place, and time. She appears well-developed and well-nourished.  HENT:  Head: Normocephalic and atraumatic.  Eyes: EOM are normal. Pupils are equal, round, and reactive to light.  Cardiovascular: Normal rate.   Respiratory: Effort normal. No respiratory distress.  GI: Soft.  Neurological: She is alert and oriented to person, place, and time.  Skin: Skin is warm.  Psychiatric: She has a normal mood and affect. Her behavior is normal. Judgment and thought content normal.     Assessment/Plan Admit for ID eval, IV antibiotics.  Dr. Hulen Skains aware and has seen the patient.  Wallace Going, DO 08/01/2015, 6:21 PM

## 2015-08-02 MED ORDER — LINEZOLID 600 MG/300ML IV SOLN
600.0000 mg | Freq: Two times a day (BID) | INTRAVENOUS | Status: DC
  Administered 2015-08-02 – 2015-08-04 (×4): 600 mg via INTRAVENOUS
  Filled 2015-08-02 (×5): qty 300

## 2015-08-02 MED ORDER — PANTOPRAZOLE SODIUM 20 MG PO TBEC
20.0000 mg | DELAYED_RELEASE_TABLET | Freq: Every day | ORAL | Status: DC
  Administered 2015-08-02 – 2015-08-04 (×3): 20 mg via ORAL
  Filled 2015-08-02 (×3): qty 1

## 2015-08-02 MED ORDER — LEVOTHYROXINE SODIUM 100 MCG PO TABS
100.0000 ug | ORAL_TABLET | Freq: Every day | ORAL | Status: DC
  Administered 2015-08-02 – 2015-08-04 (×3): 100 ug via ORAL
  Filled 2015-08-02 (×3): qty 1

## 2015-08-02 MED ORDER — METOPROLOL SUCCINATE ER 50 MG PO TB24
50.0000 mg | ORAL_TABLET | Freq: Every day | ORAL | Status: DC
  Administered 2015-08-02 – 2015-08-04 (×3): 50 mg via ORAL
  Filled 2015-08-02 (×3): qty 1

## 2015-08-02 MED ORDER — TOPIRAMATE 100 MG PO TABS
100.0000 mg | ORAL_TABLET | Freq: Every day | ORAL | Status: DC
  Administered 2015-08-02 – 2015-08-03 (×2): 100 mg via ORAL
  Filled 2015-08-02 (×2): qty 1

## 2015-08-02 MED ORDER — PNEUMOCOCCAL VAC POLYVALENT 25 MCG/0.5ML IJ INJ
0.5000 mL | INJECTION | INTRAMUSCULAR | Status: AC
  Administered 2015-08-04: 0.5 mL via INTRAMUSCULAR
  Filled 2015-08-02: qty 0.5

## 2015-08-02 NOTE — Progress Notes (Signed)
Patient stated she would like to start back on her home med, prozac. Please advise.

## 2015-08-02 NOTE — Progress Notes (Signed)
Subjective: Patient admitted for right breast infection.  Marked improvement today after IV antibiotics.  Objective: Vital signs in last 24 hours: Temp:  [98.1 F (36.7 C)-99.2 F (37.3 C)] 98.1 F (36.7 C) (07/08 0540) Pulse Rate:  [61-67] 63 (07/08 0540) Resp:  [16-19] 19 (07/08 0540) BP: (108-160)/(48-70) 108/68 mmHg (07/08 0540) SpO2:  [97 %-100 %] 97 % (07/08 0540) Weight:  [111.222 kg (245 lb 3.2 oz)] 111.222 kg (245 lb 3.2 oz) (07/07 2007) Last BM Date: 07/31/15  Intake/Output from previous day: 07/07 0701 - 07/08 0700 In: 1103 [I.V.:1103] Out: 725 [Urine:725] Intake/Output this shift:    General appearance: alert, cooperative and no distress Incision/Wound: marked improvement in redness and swelling.  Pain improved as well.  Lab Results:   Recent Labs  08/01/15 1451  WBC 6.6  HGB 11.2*  HCT 36.7  PLT 306   BMET  Recent Labs  08/01/15 1451  NA 137  K 3.4*  CL 104  CO2 25  GLUCOSE 99  BUN 10  CREATININE 0.87  CALCIUM 9.3   PT/INR No results for input(s): LABPROT, INR in the last 72 hours. ABG No results for input(s): PHART, HCO3 in the last 72 hours.  Invalid input(s): PCO2, PO2  Studies/Results: US Breast Ltd Uni Left Inc Axilla  08/01/2015  CLINICAL DATA:  Palpable mass in the medial left breast. Clinical concern for a postoperative seroma. The patient had a left lumpectomy for breast cancer on 06/17/2015 and bilateral breast reduction on 07/10/2015. EXAM: ULTRASOUND OF THE LEFT BREAST COMPARISON:  Previous exam(s). FINDINGS: Targeted ultrasound is performed, showing heterogeneous echogenic tissue with interspersed mild hypoechoic areas in the 9 o'clock position of the left breast, 4-6 cm from the nipple, at the location of the palpable mass. This has poorly defined margins, measuring approximately 4.0 x 3.7 x 2.6 cm in maximum dimensions. This has a convex anterior margin. No fluid collection is seen. IMPRESSION: Heterogeneous, primarily  echogenic, mass-like area in the 9 o'clock position of the left breast, at the location of the palpable mass. Differential considerations include a large area of fat necrosis or infection. There was no evidence of malignancy in this region on the preoperative MR dated 05/27/2015. RECOMMENDATION: Left diagnostic mammogram and possible repeat ultrasound at that time as well as clinical follow-up. I have discussed the findings and recommendations with the patient. Results were also provided in writing at the conclusion of the visit. If applicable, a reminder letter will be sent to the patient regarding the next appointment. BI-RADS CATEGORY  0: Incomplete. Need additional imaging evaluation and/or prior mammograms for comparison. Electronically Signed   By: Claudie Revering M.D.   On: 08/01/2015 17:57    Anti-infectives: Anti-infectives    Start     Dose/Rate Route Frequency Ordered Stop   08/02/15 0500  vancomycin (VANCOCIN) IVPB 1000 mg/200 mL premix  Status:  Discontinued     1,000 mg 200 mL/hr over 60 Minutes Intravenous Every 12 hours 08/01/15 1600 08/01/15 1823   08/01/15 1830  linezolid (ZYVOX) IVPB 600 mg     600 mg 300 mL/hr over 60 Minutes Intravenous Every 12 hours 08/01/15 1824     08/01/15 1545  vancomycin (VANCOCIN) 2,000 mg in sodium chloride 0.9 % 500 mL IVPB     2,000 mg 250 mL/hr over 120 Minutes Intravenous  Once 08/01/15 1545 08/01/15 1730      Assessment/Plan: s/p * No surgery found * continue antibiotics, consult ID.  No surgery today.  LOS: 1 day  Wallace Going 08/02/2015

## 2015-08-02 NOTE — Progress Notes (Addendum)
Spoke to care management concerning linezolid medication. She will be unable to contact the insurance company on the weekend, per cm, Harold Barban.

## 2015-08-03 LAB — URINE CULTURE: Culture: NO GROWTH

## 2015-08-03 MED ORDER — FLUOXETINE HCL 20 MG PO CAPS: 20.0000 mg | ORAL_CAPSULE | Freq: Every day | ORAL | Status: DC

## 2015-08-03 MED ORDER — WHITE PETROLATUM GEL
Status: AC
  Administered 2015-08-03: 1
  Filled 2015-08-03: qty 1

## 2015-08-03 NOTE — Care Management Note (Signed)
Case Management Note  Patient Details  Name: Colleen Cohen MRN: [id number] Date of Birth: 09/19/1950  Subjective/Objective:                  right breast infection  Action/Plan: CM spoke with patient and her husband at the bedside. Patient expects to be discharged home tomorrow possibly on 2 weeks of PO Zyvox depending on cost verification.  Expected Discharge Date:                  Expected Discharge Plan:  Home/Self Care  In-House Referral:     Discharge planning Services  Medication Assistance  Post Acute Care Choice:    Choice offered to:     DME Arranged:    DME Agency:     HH Arranged:    HH Agency:     Status of Service:  In process, will continue to follow  If discussed at Long Length of Stay Meetings, dates discussed:    Additional Comments:  Apolonio Schneiders, RN 08/03/2015, 11:34 AM

## 2015-08-03 NOTE — Progress Notes (Signed)
Subjective: The patient continues to do very well.  Pain improved.  Objective: Vital signs in last 24 hours: Temp:  [98.2 F (36.8 C)-98.5 F (36.9 C)] 98.2 F (36.8 C) (07/09 0543) Pulse Rate:  [55-66] 55 (07/09 0543) Resp:  [18-19] 19 (07/09 0543) BP: (102-132)/(52-73) 132/64 mmHg (07/09 0543) SpO2:  [99 %-100 %] 99 % (07/09 0543) Last BM Date: 07/31/15  Intake/Output from previous day: 07/08 0701 - 07/09 0700 In: 2031 [P.O.:990; I.V.:1041] Out: 300 [Urine:300] Intake/Output this shift:    General appearance: alert, cooperative and no distress Incision/Wound: improvement in swelling and redness.    Lab Results:   Recent Labs  08/01/15 1451  WBC 6.6  HGB 11.2*  HCT 36.7  PLT 306   BMET  Recent Labs  08/01/15 1451  NA 137  K 3.4*  CL 104  CO2 25  GLUCOSE 99  BUN 10  CREATININE 0.87  CALCIUM 9.3   PT/INR No results for input(s): LABPROT, INR in the last 72 hours. ABG No results for input(s): PHART, HCO3 in the last 72 hours.  Invalid input(s): PCO2, PO2  Studies/Results: US Breast Ltd Uni Left Inc Axilla  08/01/2015  CLINICAL DATA:  Palpable mass in the medial left breast. Clinical concern for a postoperative seroma. The patient had a left lumpectomy for breast cancer on 06/17/2015 and bilateral breast reduction on 07/10/2015. EXAM: ULTRASOUND OF THE LEFT BREAST COMPARISON:  Previous exam(s). FINDINGS: Targeted ultrasound is performed, showing heterogeneous echogenic tissue with interspersed mild hypoechoic areas in the 9 o'clock position of the left breast, 4-6 cm from the nipple, at the location of the palpable mass. This has poorly defined margins, measuring approximately 4.0 x 3.7 x 2.6 cm in maximum dimensions. This has a convex anterior margin. No fluid collection is seen. IMPRESSION: Heterogeneous, primarily echogenic, mass-like area in the 9 o'clock position of the left breast, at the location of the palpable mass. Differential considerations  include a large area of fat necrosis or infection. There was no evidence of malignancy in this region on the preoperative MR dated 05/27/2015. RECOMMENDATION: Left diagnostic mammogram and possible repeat ultrasound at that time as well as clinical follow-up. I have discussed the findings and recommendations with the patient. Results were also provided in writing at the conclusion of the visit. If applicable, a reminder letter will be sent to the patient regarding the next appointment. BI-RADS CATEGORY  0: Incomplete. Need additional imaging evaluation and/or prior mammograms for comparison. Electronically Signed   By: Claudie Revering M.D.   On: 08/01/2015 17:57    Anti-infectives: Anti-infectives    Start     Dose/Rate Route Frequency Ordered Stop   08/02/15 1830  linezolid (ZYVOX) IVPB 600 mg     600 mg 300 mL/hr over 60 Minutes Intravenous Every 12 hours 08/02/15 1346     08/02/15 0500  vancomycin (VANCOCIN) IVPB 1000 mg/200 mL premix  Status:  Discontinued     1,000 mg 200 mL/hr over 60 Minutes Intravenous Every 12 hours 08/01/15 1600 08/01/15 1823   08/01/15 1830  linezolid (ZYVOX) IVPB 600 mg  Status:  Discontinued     600 mg 300 mL/hr over 60 Minutes Intravenous Every 12 hours 08/01/15 1824 08/02/15 1346   08/01/15 1545  vancomycin (VANCOCIN) 2,000 mg in sodium chloride 0.9 % 500 mL IVPB     2,000 mg 250 mL/hr over 120 Minutes Intravenous  Once 08/01/15 1545 08/01/15 1730      Assessment/Plan: s/p * No surgery found * Plan  for discharge tomorrow  LOS: 2 days    Colleen Cohen 08/03/2015

## 2015-08-03 NOTE — Progress Notes (Signed)
Per pharamcy, per doctor, per phone call, patient told Prozac is contraindicated with Linezolid.  Patient verbalized understanding.

## 2015-08-03 NOTE — Discharge Instructions (Signed)
vaseline to the right nipple daily Collagen to the open wounds of right breast daily May shower

## 2015-08-04 ENCOUNTER — Other Ambulatory Visit (HOSPITAL_COMMUNITY): Payer: Self-pay | Admitting: *Deleted

## 2015-08-04 NOTE — Care Management Note (Addendum)
Case Management Note  Patient Details  Name: Isatu Frymire MRN: [id number] Date of Birth: 1950-07-01  Subjective/Objective:                    Action/Plan:  Confirmed face sheet information with patient and husband .  Patient needs Zyvox 600 mg PO BID x 2 weeks   [facility] [address] , [place] , they run prescription co pay for generic is $12.50 . They do not have medication in stock . If they order medication today they "might " have it tomorrow . They will not order unless CM calls back.   [facility]  [address], [place] they have generic in stock.   Dr Marla Roe aware and will [facility] .  Patient and husband aware . Driving directions given to husband .   Patient and husband offered choice , decided on [place] [phone] , referral called to same spoke with Denny Peon who accepted referral . Clinical and orders faxed to [facility] at [phone]     Expected Discharge Date:                  Expected Discharge Plan:  [place]  In-House Referral:     Discharge planning Services  Medication Assistance, CM Consult  Post Acute Care Choice:  Home Health Choice offered to:  Patient, Spouse  DME Arranged:    DME Agency:     HH Arranged:  RN [place] Agency:   [facility]  Status of Service:  In process, will continue to follow  If discussed at Long Length of Stay Meetings, dates discussed:    Additional Comments:  Marilu Favre, RN 08/04/2015, 11:40 AM

## 2015-08-04 NOTE — Care Management (Signed)
Entered benefits check for Zyvox 600 mg PO BID x 2 weeks   Magdalen Spatz RN BSN [id number]

## 2015-08-04 NOTE — Progress Notes (Signed)
Colleen Cohen to be D/C'd  per MD order. Discussed with the patient and all questions fully answered.  VSS, Skin clean, dry and intact without evidence of skin break down, no evidence of skin tears noted.  IV catheter discontinued intact. Site without signs and symptoms of complications. Dressing and pressure applied.  An After Visit Summary was printed and given to the patient. Patient received prescription.  D/c education completed with patient/family including follow up instructions, medication list, d/c activities limitations if indicated, with other d/c instructions as indicated by MD - patient able to verbalize understanding, all questions fully answered.   Patient instructed to return to ED, call 911, or call MD for any changes in condition.   Patient to be escorted via [place], and D/C home via private auto.

## 2015-08-04 NOTE — Discharge Summary (Signed)
Physician Discharge Summary  Patient ID: Colleen Cohen MRN: [id number] DOB/AGE: 1950-11-29 65 y.o.  Admit date: 08/01/2015 Discharge date: 08/04/2015  Admission Diagnoses:  Discharge Diagnoses:  Active Problems:   Status post bilateral breast reduction   Status post partial mastectomy of left breast   Open wound of right breast   Open breast wound   Discharged Condition: good  Hospital Course: The patient was seen in the office on Friday and had a red, swollen tender right breast.  She had been on antibiotics without improvement.  There was concern about the increase in redness.  She was sent to the ED and Vancomycin was started.  The patient had a reaction and it was stopped with Linezolid started via pharmacy and ID input.  She had rapid improvement in all symptoms.  Continued with wound care and hydration as well.  She was much better at the time of discharge.  She was instructed on no prozac while on the antibiotic.  Consults: ID input  Significant Diagnostic Studies: U/S left breast = fat necrosis  Treatments: IV hydration and antibiotics: Linezolid  Discharge Exam: Blood pressure 139/60, pulse 68, temperature 98 F (36.7 C), temperature source Oral, resp. rate 18, height 5\' 7"  (1.702 m), weight 111.222 kg (245 lb 3.2 oz), SpO2 97 %. General appearance: alert, cooperative and no distress Incision/Wound:  Disposition: 01-Home or Self Care  Discharge Instructions    Care order/instruction    Complete by:  As directed   The [facility]olid was [facility] into [facility] [place]. Do not take Prozac while on Linezolid.  See PCP for alternate if needed.            Medication List    STOP taking these medications        cephALEXin 500 MG capsule  Commonly known as:  KEFLEX     FLUoxetine 20 MG capsule  Commonly known as:  PROZAC     ondansetron 4 MG disintegrating tablet  Commonly known as:  ZOFRAN-ODT     pantoprazole 40 MG tablet  Commonly known as:  PROTONIX     polyethylene glycol packet  Commonly known as:  MIRALAX / GLYCOLAX     sulfamethoxazole-trimethoprim 800-160 MG tablet  Commonly known as:  BACTRIM DS,SEPTRA DS      TAKE these medications        diazepam 2 MG tablet  Commonly known as:  VALIUM  Take 1 tablet (2 mg total) by mouth every 12 (twelve) hours as needed for muscle spasms.     docusate sodium 100 MG capsule  Commonly known as:  COLACE  Take 1 capsule (100 mg total) by mouth 2 (two) times daily.     hydrochlorothiazide 25 MG tablet  Commonly known as:  HYDRODIURIL  Take 25 mg by mouth daily.     HYDROcodone-acetaminophen 5-325 MG tablet  Commonly known as:  NORCO/VICODIN  Take 1-2 tablets by mouth every 4 (four) hours as needed for moderate pain.     HYDROcodone-acetaminophen 5-325 MG tablet  Commonly known as:  NORCO  Take 1 tablet by mouth every 6 (six) hours as needed for moderate pain.     levothyroxine 137 MCG tablet  Commonly known as:  SYNTHROID, LEVOTHROID  Take 137 mcg by mouth daily before breakfast.     metoprolol succinate 50 MG 24 hr tablet  Commonly known as:  TOPROL-XL  Take 50 mg by mouth daily. Take with or immediately following a meal.     oxyCODONE 5 MG immediate release  tablet  Commonly known as:  Oxy IR/ROXICODONE  Take 5 mg by mouth daily as needed.     RA PROBIOTIC GUMMIES PO  Take 1 each by mouth daily.     topiramate 100 MG tablet  Commonly known as:  TOPAMAX  Take 100 mg by mouth at bedtime.     zolpidem 10 MG tablet  Commonly known as:  AMBIEN  Take 10 mg by mouth at bedtime as needed for sleep.           Follow-up Information    Follow up with Wallace Going, DO In 1 week.   Specialty:  Plastic Surgery   Contact information:   [place] [address] [id number]       Signed: Wallace Going 08/04/2015, 11:20 AM

## 2015-08-06 LAB — CULTURE, BLOOD (ROUTINE X 2)
Culture: NO GROWTH
Culture: NO GROWTH

## 2015-08-10 ENCOUNTER — Encounter (HOSPITAL_COMMUNITY): Payer: Self-pay | Admitting: Hematology & Oncology

## 2015-08-11 ENCOUNTER — Encounter (HOSPITAL_BASED_OUTPATIENT_CLINIC_OR_DEPARTMENT_OTHER): Payer: Self-pay | Admitting: *Deleted

## 2015-08-11 ENCOUNTER — Other Ambulatory Visit: Payer: Self-pay | Admitting: Plastic Surgery

## 2015-08-11 DIAGNOSIS — S21001A Unspecified open wound of right breast, initial encounter: Secondary | ICD-10-CM

## 2015-08-11 NOTE — Progress Notes (Signed)
NPO AFTER MN WITH EXCEPTION CLEAR LIQUIDS UNTIL 0730 (NO CREAM/ MILK PRODUCTS).  ARRIVE AT 1200.  CURRENT LAB RESULTS AND EKG IN CHART AND EPIC. WILL TAKE TOPROL, SYNTHROID, AND PROTONIX IF NEEDED W/ SIPS OF WATER AND IF NEEDED TAKE OXYCODONE.

## 2015-08-13 ENCOUNTER — Other Ambulatory Visit: Payer: Self-pay | Admitting: Plastic Surgery

## 2015-08-13 NOTE — H&P (View-Only) (Signed)
Colleen Cohen is an 65 y.o. female.   Chief Complaint: right breast wound HPI: The patient is a 65 yrs old wf here for treatment of a tender red right breast.  She underwent a left partial mastectomy last month.  The margins were positive so a repeat excision was done.  The left side is healing well.  There is mild fat necrosis at the 9 o'clock position.  The right breast has some necrosis of the right breast but is stable.  The lower portion of the breast is red and tender.  She was seen yesterday and bactrim was started.  The redness got worse over the morning and she returned to the office.  She is not febrile but scared and tearful.  The redness is more widespread and the area more tender.  Past Medical History  Diagnosis Date  . Hypertension   . GERD (gastroesophageal reflux disease)   . Anxiety   . Depression   . Hypothyroidism   . Nephrolithiasis   . Myopia of both eyes   . PONV (postoperative nausea and vomiting)   . Migraine   . Wears dentures   . Arthritis   . Cancer [facility])     breast cancer    Past Surgical History  Procedure Laterality Date  . Cholecystectomy    . Back surgery    . Thyroidectomy    . Partial nephrectomy    . Shoulder arthroscopy    . Cystoscopy w/ ureteral stent placement    . 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 Breast reduction surgery Bilateral 06/17/2015    Procedure: BILATERAL BREAST  REDUCTION  WITH RECONSTRUCTION  ON LEFT  AFTER PARTIAL MASTECTOMY;  Surgeon: Wallace Going, DO;  Location: [place];  Service: Plastics;  Laterality: Bilateral;  . Re-excision of breast lumpectomy Left 07/10/2015    Procedure:  RE-EXCISION OF LEFT BREAST LUMPECTOMY ;  Surgeon: Judeth Horn, MD;  Location: [place];  Service: General;  Laterality: Left;  . Breast reconstruction Left 07/10/2015    Procedure: REVISION OF LEFT BREAST REDUCTION;  Surgeon: Loel Lofty Dillingham, DO;  Location: [place];  Service: Plastics;  Laterality: Left;    History reviewed. No pertinent family history. Social History:  reports that she has never smoked. She has never used smokeless tobacco. She reports that she drinks alcohol. She reports that she does not use illicit drugs.  Allergies:  Allergies  Allergen Reactions  . Bactrim [Sulfamethoxazole-Trimethoprim] Other (See Comments)    Lactic acid increase  . Codeine Rash     (Not in a hospital admission)  Results for orders placed or performed during the hospital encounter of 08/01/15 (from the past 48 hour(s))  Comprehensive metabolic panel     Status: Abnormal   Collection Time: 08/01/15  2:51 PM  Result Value Ref Range   Sodium 137 135 - 145 mmol/L   Potassium 3.4 (L) 3.5 - 5.1 mmol/L   Chloride 104 101 - 111 mmol/L   CO2 25 22 - 32 mmol/L   Glucose, Bld 99 65 - 99 mg/dL   BUN 10 6 - 20 mg/dL   Creatinine, Ser 0.87 0.44 - 1.00 mg/dL   Calcium 9.3 8.9 -  10.3 mg/dL   Total Protein 6.9 6.5 - 8.1 g/dL   Albumin 3.6 3.5 - 5.0 g/dL   AST 17 15 - 41 U/L   ALT 15 14 - 54 U/L   Alkaline Phosphatase 58 38 - 126 U/L   Total Bilirubin 0.5 0.3 - 1.2 mg/dL   GFR calc non Af Amer >60 >60 mL/min   GFR calc Af Amer >60 >60 mL/min    Comment: (NOTE) The eGFR has been calculated using the CKD EPI equation. This calculation has not been validated in all clinical situations. eGFR's persistently <60 mL/min signify possible Chronic Kidney Disease.    Anion gap 8 5 - 15  CBC with Differential     Status: Abnormal   Collection Time: 08/01/15  2:51 PM  Result Value Ref Range   WBC 6.6 4.0 - 10.5 K/uL   RBC 4.46 3.87 - 5.11 MIL/uL   Hemoglobin 11.2 (L) 12.0 - 15.0 g/dL   HCT 36.7 36.0 - 46.0  %   MCV 82.3 78.0 - 100.0 fL   MCH 25.1 (L) 26.0 - 34.0 pg   MCHC 30.5 30.0 - 36.0 g/dL   RDW 14.1 11.5 - 15.5 %   Platelets 306 150 - 400 K/uL   Neutrophils Relative % 49 %   Neutro Abs 3.2 1.7 - 7.7 K/uL   Lymphocytes Relative 40 %   Lymphs Abs 2.6 0.7 - 4.0 K/uL   Monocytes Relative 6 %   Monocytes Absolute 0.4 0.1 - 1.0 K/uL   Eosinophils Relative 5 %   Eosinophils Absolute 0.3 0.0 - 0.7 K/uL   Basophils Relative 0 %   Basophils Absolute 0.0 0.0 - 0.1 K/uL  I-Stat beta hCG blood, ED     Status: Abnormal   Collection Time: 08/01/15  3:00 PM  Result Value Ref Range   I-stat hCG, quantitative 11.3 (H) <5 mIU/mL   Comment 3            Comment:   GEST. AGE      CONC.  (mIU/mL)   <=1 WEEK        5 - 50     2 WEEKS       50 - 500     3 WEEKS       100 - 10,000     4 WEEKS     1,000 - 30,000        FEMALE AND NON-PREGNANT FEMALE:     LESS THAN 5 mIU/mL   I-Stat CG4 Lactic Acid, ED     Status: Abnormal   Collection Time: 08/01/15  3:03 PM  Result Value Ref Range   Lactic Acid, Venous 2.00 (HH) 0.5 - 1.9 mmol/L   Comment NOTIFIED PHYSICIAN    US Breast Ltd Uni Left Inc Axilla  08/01/2015  CLINICAL DATA:  Palpable mass in the medial left breast. Clinical concern for a postoperative seroma. The patient had a left lumpectomy for breast cancer on 06/17/2015 and bilateral breast reduction on 07/10/2015. EXAM: ULTRASOUND OF THE LEFT BREAST COMPARISON:  Previous exam(s). FINDINGS: Targeted ultrasound is performed, showing heterogeneous echogenic tissue with interspersed mild hypoechoic areas in the 9 o'clock position of the left breast, 4-6 cm from the nipple, at the location of the palpable mass. This has poorly defined margins, measuring approximately 4.0 x 3.7 x 2.6 cm in maximum dimensions. This has a convex anterior margin. No fluid collection is seen. IMPRESSION: Heterogeneous, primarily echogenic, mass-like area in the 9 o'clock position of the  left breast, at the location of the palpable  mass. Differential considerations include a large area of fat necrosis or infection. There was no evidence of malignancy in this region on the preoperative MR dated 05/27/2015. RECOMMENDATION: Left diagnostic mammogram and possible repeat ultrasound at that time as well as clinical follow-up. I have discussed the findings and recommendations with the patient. Results were also provided in writing at the conclusion of the visit. If applicable, a reminder letter will be sent to the patient regarding the next appointment. BI-RADS CATEGORY  0: Incomplete. Need additional imaging evaluation and/or prior mammograms for comparison. Electronically Signed   By: Claudie Revering M.D.   On: 08/01/2015 17:57    Review of Systems  Constitutional: Negative.   HENT: Negative.   Eyes: Negative.   Respiratory: Negative.   Cardiovascular: Negative.   Gastrointestinal: Negative.   Genitourinary: Negative.   Musculoskeletal: Negative.   Skin: Negative.   Psychiatric/Behavioral: Negative.     Blood pressure 160/70, pulse 67, temperature 99.2 F (37.3 C), temperature source Oral, resp. rate 18, SpO2 100 %. Physical Exam  Constitutional: She is oriented to person, place, and time. She appears well-developed and well-nourished.  HENT:  Head: Normocephalic and atraumatic.  Eyes: EOM are normal. Pupils are equal, round, and reactive to light.  Cardiovascular: Normal rate.   Respiratory: Effort normal. No respiratory distress.  GI: Soft.  Neurological: She is alert and oriented to person, place, and time.  Skin: Skin is warm.  Psychiatric: She has a normal mood and affect. Her behavior is normal. Judgment and thought content normal.     Assessment/Plan Admit for ID eval, IV antibiotics.  Dr. Hulen Skains aware and has seen the patient.  Wallace Going, DO 08/01/2015, 6:21 PM

## 2015-08-13 NOTE — Interval H&P Note (Signed)
History and Physical Interval Note:  08/13/2015 4:18 PM  Colleen Cohen  has presented today for surgery, with the diagnosis of right breast wound  The various methods of treatment have been discussed with the patient and family. After consideration of risks, benefits and other options for treatment, the patient has consented to  Procedure(s): Excision of right breast WOUND (Right) APPLICATION OF A-CELL OF right breast (Right) as a surgical intervention .  The patient's history has been reviewed, patient examined, no change in status, stable for surgery.  I have reviewed the patient's chart and labs.  Questions were answered to the patient's satisfaction.     Wallace Going

## 2015-08-14 ENCOUNTER — Ambulatory Visit (HOSPITAL_BASED_OUTPATIENT_CLINIC_OR_DEPARTMENT_OTHER): Payer: BLUE CROSS/BLUE SHIELD | Admitting: Anesthesiology

## 2015-08-14 ENCOUNTER — Ambulatory Visit (HOSPITAL_BASED_OUTPATIENT_CLINIC_OR_DEPARTMENT_OTHER)
Admission: RE | Admit: 2015-08-14 | Discharge: 2015-08-14 | Disposition: A | Discharge status: Home / Self Care | Payer: BLUE CROSS/BLUE SHIELD | Source: Ambulatory Visit | Attending: Plastic Surgery | Admitting: Plastic Surgery

## 2015-08-14 ENCOUNTER — Encounter (HOSPITAL_BASED_OUTPATIENT_CLINIC_OR_DEPARTMENT_OTHER)
Admission: RE | Disposition: A | Discharge status: Home / Self Care | Payer: Self-pay | Source: Ambulatory Visit | Attending: Plastic Surgery

## 2015-08-14 ENCOUNTER — Encounter (HOSPITAL_BASED_OUTPATIENT_CLINIC_OR_DEPARTMENT_OTHER): Payer: Self-pay | Admitting: *Deleted

## 2015-08-14 DIAGNOSIS — I10 Essential (primary) hypertension: Secondary | ICD-10-CM | POA: Diagnosis not present

## 2015-08-14 DIAGNOSIS — Z905 Acquired absence of kidney: Secondary | ICD-10-CM | POA: Diagnosis not present

## 2015-08-14 DIAGNOSIS — Z6837 Body mass index (BMI) 37.0-37.9, adult: Secondary | ICD-10-CM | POA: Insufficient documentation

## 2015-08-14 DIAGNOSIS — Z853 Personal history of malignant neoplasm of breast: Secondary | ICD-10-CM | POA: Diagnosis not present

## 2015-08-14 DIAGNOSIS — Y838 Other surgical procedures as the cause of abnormal reaction of the patient, or of later complication, without mention of misadventure at the time of the procedure: Secondary | ICD-10-CM | POA: Insufficient documentation

## 2015-08-14 DIAGNOSIS — N641 Fat necrosis of breast: Secondary | ICD-10-CM | POA: Insufficient documentation

## 2015-08-14 DIAGNOSIS — S21001A Unspecified open wound of right breast, initial encounter: Secondary | ICD-10-CM

## 2015-08-14 DIAGNOSIS — E039 Hypothyroidism, unspecified: Secondary | ICD-10-CM | POA: Diagnosis not present

## 2015-08-14 DIAGNOSIS — T8189XA Other complications of procedures, not elsewhere classified, initial encounter: Secondary | ICD-10-CM | POA: Diagnosis not present

## 2015-08-14 DIAGNOSIS — Z79899 Other long term (current) drug therapy: Secondary | ICD-10-CM | POA: Diagnosis not present

## 2015-08-14 DIAGNOSIS — K219 Gastro-esophageal reflux disease without esophagitis: Secondary | ICD-10-CM | POA: Insufficient documentation

## 2015-08-14 HISTORY — DX: Personal history of urinary calculi: Z87.442

## 2015-08-14 HISTORY — DX: Presence of spectacles and contact lenses: Z97.3

## 2015-08-14 HISTORY — PX: APPLICATION OF WOUND VAC: SHX5189

## 2015-08-14 HISTORY — DX: Postprocedural hypothyroidism: E89.0

## 2015-08-14 HISTORY — DX: Malignant neoplasm of unspecified site of unspecified female breast: C50.919

## 2015-08-14 HISTORY — PX: INCISION AND DRAINAGE OF WOUND: SHX1803

## 2015-08-14 HISTORY — DX: Unspecified open wound of right breast, initial encounter: S21.001A

## 2015-08-14 SURGERY — IRRIGATION AND DEBRIDEMENT WOUND
Anesthesia: General | Site: Breast | Laterality: Right

## 2015-08-14 MED ORDER — SODIUM CHLORIDE 0.9 % IR SOLN
Status: DC | PRN
  Administered 2015-08-14: 500 mL

## 2015-08-14 MED ORDER — ACETAMINOPHEN 160 MG/5ML PO SOLN
975.0000 mg | Freq: Once | ORAL | Status: AC
  Administered 2015-08-14: 975 mg via ORAL
  Filled 2015-08-14: qty 40.6

## 2015-08-14 MED ORDER — LACTATED RINGERS IV SOLN
INTRAVENOUS | Status: DC
  Administered 2015-08-14: 13:00:00 via INTRAVENOUS
  Filled 2015-08-14: qty 1000

## 2015-08-14 MED ORDER — FENTANYL CITRATE (PF) 100 MCG/2ML IJ SOLN
INTRAMUSCULAR | Status: DC | PRN
  Administered 2015-08-14 (×2): 50 ug via INTRAVENOUS

## 2015-08-14 MED ORDER — SCOPOLAMINE 1 MG/3DAYS TD PT72
1.0000 | MEDICATED_PATCH | TRANSDERMAL | Status: DC
  Administered 2015-08-14: 1.5 mg via TRANSDERMAL
  Administered 2015-08-14: 1 via TRANSDERMAL
  Filled 2015-08-14: qty 1

## 2015-08-14 MED ORDER — LIDOCAINE HCL (CARDIAC) 20 MG/ML IV SOLN
INTRAVENOUS | Status: DC | PRN
  Administered 2015-08-14: 80 mg via INTRAVENOUS

## 2015-08-14 MED ORDER — CHLORHEXIDINE GLUCONATE CLOTH 2 % EX PADS
6.0000 | MEDICATED_PAD | Freq: Once | CUTANEOUS | Status: DC
  Filled 2015-08-14: qty 6

## 2015-08-14 MED ORDER — MIDAZOLAM HCL 5 MG/5ML IJ SOLN
INTRAMUSCULAR | Status: DC | PRN
  Administered 2015-08-14: 2 mg via INTRAVENOUS

## 2015-08-14 MED ORDER — FENTANYL CITRATE (PF) 100 MCG/2ML IJ SOLN
25.0000 ug | INTRAMUSCULAR | Status: DC | PRN
  Filled 2015-08-14: qty 1

## 2015-08-14 MED ORDER — CEFAZOLIN SODIUM-DEXTROSE 2-4 GM/100ML-% IV SOLN
INTRAVENOUS | Status: AC
  Filled 2015-08-14: qty 100

## 2015-08-14 MED ORDER — ONDANSETRON HCL 4 MG/2ML IJ SOLN
INTRAMUSCULAR | Status: DC | PRN
  Administered 2015-08-14: 4 mg via INTRAVENOUS

## 2015-08-14 MED ORDER — PROPOFOL 500 MG/50ML IV EMUL
INTRAVENOUS | Status: DC | PRN
  Administered 2015-08-14: 200 mL via INTRAVENOUS

## 2015-08-14 MED ORDER — DEXAMETHASONE SODIUM PHOSPHATE 10 MG/ML IJ SOLN
INTRAMUSCULAR | Status: DC | PRN
  Administered 2015-08-14: 10 mg via INTRAVENOUS

## 2015-08-14 MED ORDER — CEFAZOLIN SODIUM-DEXTROSE 2-4 GM/100ML-% IV SOLN
2.0000 g | INTRAVENOUS | Status: AC
  Administered 2015-08-14: 2 g via INTRAVENOUS
  Filled 2015-08-14: qty 100

## 2015-08-14 SURGICAL SUPPLY — 44 items
BINDER BREAST XLRG (GAUZE/BANDAGES/DRESSINGS) ×3 IMPLANT
BLADE HEX COATED 2.75 (ELECTRODE) IMPLANT
BLADE SURG 10 STRL SS (BLADE) ×9 IMPLANT
BLADE SURG 15 STRL LF DISP TIS (BLADE) ×6 IMPLANT
BLADE SURG 15 STRL SS (BLADE) ×3
CANISTER SUCTION 1200CC (MISCELLANEOUS) IMPLANT
CANISTER SUCTION 2500CC (MISCELLANEOUS) ×3 IMPLANT
COVER BACK TABLE 60X90IN (DRAPES) ×3 IMPLANT
COVER MAYO STAND STRL (DRAPES) IMPLANT
DRAIN PENROSE 18X1/2 LTX STRL (DRAIN) IMPLANT
DRAPE EXTREMITY TIBURON (DRAPES) IMPLANT
DRAPE INCISE IOBAN 66X45 STRL (DRAPES) IMPLANT
DRAPE LAPAROTOMY 100X72 PEDS (DRAPES) IMPLANT
DRAPE LG THREE QUARTER DISP (DRAPES) IMPLANT
DRAPE ORTHO SPLIT 77X108 STRL (DRAPES)
DRAPE SURG ORHT 6 SPLT 77X108 (DRAPES) IMPLANT
DRSG ADAPTIC 3X8 NADH LF (GAUZE/BANDAGES/DRESSINGS) ×3 IMPLANT
DRSG PAD ABDOMINAL 8X10 ST (GAUZE/BANDAGES/DRESSINGS) ×3 IMPLANT
ELECT REM PT RETURN 9FT ADLT (ELECTROSURGICAL) ×3
ELECTRODE REM PT RTRN 9FT ADLT (ELECTROSURGICAL) ×2 IMPLANT
GAUZE SPONGE 4X4 12PLY STRL (GAUZE/BANDAGES/DRESSINGS) ×3 IMPLANT
GAUZE XEROFORM 1X8 LF (GAUZE/BANDAGES/DRESSINGS) IMPLANT
GAUZE XEROFORM 5X9 LF (GAUZE/BANDAGES/DRESSINGS) IMPLANT
GLOVE BIO SURGEON STRL SZ 6.5 (GLOVE) ×6 IMPLANT
GOWN STRL REUS W/ TWL LRG LVL3 (GOWN DISPOSABLE) ×6 IMPLANT
GOWN STRL REUS W/TWL LRG LVL3 (GOWN DISPOSABLE) ×3
KIT ROOM TURNOVER WOR (KITS) ×3 IMPLANT
NEEDLE PRECISIONGLIDE 27X1.5 (NEEDLE) IMPLANT
NS IRRIG 1000ML POUR BTL (IV SOLUTION) IMPLANT
PACK BASIN DAY SURGERY FS (CUSTOM PROCEDURE TRAY) ×3 IMPLANT
PAD ABD 8X10 STRL (GAUZE/BANDAGES/DRESSINGS) ×3 IMPLANT
PENCIL BUTTON HOLSTER BLD 10FT (ELECTRODE) ×3 IMPLANT
SPONGE GAUZE 4X4 12PLY STER LF (GAUZE/BANDAGES/DRESSINGS) ×3 IMPLANT
SPONGE LAP 18X18 X RAY DECT (DISPOSABLE) ×3 IMPLANT
SPONGE LAP 4X18 X RAY DECT (DISPOSABLE) ×3 IMPLANT
SURGILUBE 2OZ TUBE FLIPTOP (MISCELLANEOUS) ×3 IMPLANT
SYR BULB IRRIGATION 50ML (SYRINGE) ×3 IMPLANT
SYR CONTROL 10ML LL (SYRINGE) ×3 IMPLANT
TOWEL OR 17X24 6PK STRL BLUE (TOWEL DISPOSABLE) ×3 IMPLANT
TRAY DSU PREP LF (CUSTOM PROCEDURE TRAY) ×3 IMPLANT
TUBE CONNECTING 12X1/4 (SUCTIONS) ×3 IMPLANT
UNDERPAD 30X30 INCONTINENT (UNDERPADS AND DIAPERS) ×3 IMPLANT
WATER STERILE IRR 500ML POUR (IV SOLUTION) ×3 IMPLANT
YANKAUER SUCT BULB TIP NO VENT (SUCTIONS) ×3 IMPLANT

## 2015-08-14 NOTE — Op Note (Signed)
Operative Note   DATE OF OPERATION: 08/14/2015  LOCATION: [facility]  SURGICAL DIVISION: Plastic Surgery  PREOPERATIVE DIAGNOSES:  Right breast wound after reduction  POSTOPERATIVE DIAGNOSES:  same  PROCEDURE:   Excisional debridement of right breast wound (skin and fat)  Nipple 4 x 4 cm and inframammary fold 1.5 x 4 cm Placement of VAC dressing  SURGEON: Claire Sanger Dillingham, DO  ANESTHESIA:  General.   COMPLICATIONS: None.   INDICATIONS FOR PROCEDURE:  The patient, Colleen Cohen is a 65 y.o. female born on 31-Jul-1950, is here for treatment of a right breast wound at the nipple and inframammary fold. MRN: [id number]  CONSENT:  Informed consent was obtained directly from the patient. Risks, benefits and alternatives were fully discussed. Specific risks including but not limited to bleeding, infection, hematoma, seroma, scarring, pain, infection, contracture, asymmetry, wound healing problems, and need for further surgery were all discussed. The patient did have an ample opportunity to have questions answered to satisfaction.   DESCRIPTION OF PROCEDURE:  The patient was taken to the operating room. SCDs were placed and IV antibiotics were given. The patient's operative site was prepped and draped in a sterile fashion. A time out was performed and all information was confirmed to be correct.  General anesthesia was administered.  The area was irrigated with antibiotic solution.  The #10 blade was used to excise and debride the nonviable tissue of the nipple 4 x 4 cm with skin and fat for 46 cm deep. The #10 blade was used to excise the 1.5  X 4 cm at the inframammary fold.  Debridement included the skin and fat. The VAC was applied with excellent seal.  The patient tolerated the procedure well.  There were no complications. The patient was allowed to wake from anesthesia, extubated and taken to the recovery room in satisfactory condition.

## 2015-08-14 NOTE — Interval H&P Note (Signed)
History and Physical Interval Note:  08/14/2015 12:46 PM  Colleen Cohen  has presented today for surgery, with the diagnosis of right breast wound  The various methods of treatment have been discussed with the patient and family. After consideration of risks, benefits and other options for treatment, the patient has consented to  Procedure(s): Excision of right breast WOUND (Right) APPLICATION OF A-CELL OF right breast (Right) as a surgical intervention .  The patient's history has been reviewed, patient examined, no change in status, stable for surgery.  I have reviewed the patient's chart and labs.  Questions were answered to the patient's satisfaction.     Wallace Going

## 2015-08-14 NOTE — Discharge Instructions (Signed)
Keep clean Can shower tomorrow   Post Anesthesia Home Care Instructions  Activity: Get plenty of rest for the remainder of the day. A responsible adult should stay with you for 24 hours following the procedure.  For the next 24 hours, DO NOT: -Drive a car -Paediatric nurse -Drink alcoholic beverages -Take any medication unless instructed by your physician -Make any legal decisions or sign important papers.  Meals: Start with liquid foods such as gelatin or soup. Progress to regular foods as tolerated. Avoid greasy, spicy, heavy foods. If nausea and/or vomiting occur, drink only clear liquids until the nausea and/or vomiting subsides. Call your physician if vomiting continues.  Special Instructions/Symptoms: Your throat may feel dry or sore from the anesthesia or the breathing tube placed in your throat during surgery. If this causes discomfort, gargle with warm salt water. The discomfort should disappear within 24 hours.  If you had a scopolamine patch placed behind your ear for the management of post- operative nausea and/or vomiting:  1. The medication in the patch is effective for 72 hours, after which it should be removed.  Wrap patch in a tissue and discard in the trash. Wash hands thoroughly with soap and water. 2. You may remove the patch earlier than 72 hours if you experience unpleasant side effects which may include dry mouth, dizziness or visual disturbances. 3. Avoid touching the patch. Wash your hands with soap and water after contact with the patch.   Call your surgeon if you experience:   1.  Fever over 101.0. 2.  Inability to urinate. 3.  Nausea and/or vomiting. 4.  Extreme swelling or bruising at the surgical site. 5.  Continued bleeding from the incision. 6.  Increased pain, redness or drainage from the incision. 7.  Problems related to your pain medication. 8.  Any problems and/or concerns

## 2015-08-14 NOTE — Anesthesia Procedure Notes (Signed)
Procedure Name: LMA Insertion Date/Time: 08/14/2015 1:14 PM Performed by: Wanita Chamberlain Pre-anesthesia Checklist: Patient identified, Timeout performed, Emergency Drugs available, Suction available and Patient being monitored Patient Re-evaluated:Patient Re-evaluated prior to inductionOxygen Delivery Method: Circle system utilized Preoxygenation: Pre-oxygenation with 100% oxygen Intubation Type: IV induction LMA: LMA inserted LMA Size: 4.0 Number of attempts: 1 Placement Confirmation: positive ETCO2 and breath sounds checked- equal and bilateral Tube secured with: Tape Dental Injury: Teeth and Oropharynx as per pre-operative assessment

## 2015-08-14 NOTE — Anesthesia Preprocedure Evaluation (Addendum)
Anesthesia Evaluation  Patient identified by MRN, date of birth, ID band Patient awake    Reviewed: Allergy & Precautions, H&P , NPO status , Patient's Chart, lab work & pertinent test results, reviewed documented beta blocker date and time   History of Anesthesia Complications (+) PONV and DIFFICULT IV STICK / SPECIAL LINE  Airway Mallampati: II  TM Distance: >3 FB Neck ROM: Limited    Dental no notable dental hx. (+) Teeth Intact, Dental Advisory Given   Pulmonary    Pulmonary exam normal breath sounds clear to auscultation       Cardiovascular Exercise Tolerance: Poor hypertension, Pt. on medications and Pt. on home beta blockers Normal cardiovascular exam Rhythm:regular Rate:Normal     Neuro/Psych PSYCHIATRIC DISORDERS Anxiety Depression    GI/Hepatic GERD  Medicated and Controlled,  Endo/Other  Hypothyroidism   Renal/GU      Musculoskeletal  (+) Arthritis , Osteoarthritis,    Abdominal   Peds  Hematology   Anesthesia Other Findings   Reproductive/Obstetrics                       Anesthesia Physical Anesthesia Plan  ASA: II  Anesthesia Plan:    Post-op Pain Management:    Induction: Intravenous  Airway Management Planned: LMA  Additional Equipment:   Intra-op Plan:   Post-operative Plan:   Informed Consent: I have reviewed the patients History and Physical, chart, labs and discussed the procedure including the risks, benefits and alternatives for the proposed anesthesia with the patient or authorized representative who has indicated his/her understanding and acceptance.   Dental Advisory Given and Dental advisory given  Plan Discussed with: CRNA and Surgeon  Anesthesia Plan Comments: (Discussed GA with LMA, possible sore throat, potential need to switch to ETT, N/V, pulmonary aspiration. Questions answered. )       Anesthesia Quick Evaluation                                   Anesthesia Evaluation  Patient identified by MRN, date of birth, ID band Patient awake    Reviewed: Allergy & Precautions, NPO status , Patient's Chart, lab work & pertinent test results, reviewed documented beta blocker date and time   History of Anesthesia Complications (+) PONV and history of anesthetic complications  Airway Mallampati: III  TM Distance: >3 FB Neck ROM: Full    Dental  (+) Teeth Intact, Loose,    Pulmonary neg pulmonary ROS,    Pulmonary exam normal breath sounds clear to auscultation       Cardiovascular hypertension, Pt. on medications and Pt. on home beta blockers Normal cardiovascular exam Rhythm:Regular Rate:Normal     Neuro/Psych  Headaches, PSYCHIATRIC DISORDERS Anxiety Depression    GI/Hepatic Neg liver ROS, GERD  Medicated and Controlled,  Endo/Other  Hypothyroidism Morbid obesity  Renal/GU Renal diseaseNephrolithiasis  negative genitourinary   Musculoskeletal negative musculoskeletal ROS (+)   Abdominal (+) + obese,   Peds  Hematology negative hematology ROS (+)   Anesthesia Other Findings   Reproductive/Obstetrics                            Anesthesia Physical Anesthesia Plan  ASA: III  Anesthesia Plan: General   Post-op Pain Management:    Induction: Intravenous  Airway Management Planned: LMA and Oral ETT  Additional Equipment: None  Intra-op Plan:  Post-operative Plan: Extubation in OR  Informed Consent: I have reviewed the patients History and Physical, chart, labs and discussed the procedure including the risks, benefits and alternatives for the proposed anesthesia with the patient or authorized representative who has indicated his/her understanding and acceptance.   Dental advisory given  Plan Discussed with: CRNA and Surgeon  Anesthesia Plan Comments:         Anesthesia Quick Evaluation                                   Anesthesia Evaluation  Patient  identified by MRN, date of birth, ID band Patient awake    Reviewed: Allergy & Precautions, NPO status , Patient's Chart, lab work & pertinent test results, reviewed documented beta blocker date and time   History of Anesthesia Complications (+) PONV and history of anesthetic complications  Airway Mallampati: III  TM Distance: >3 FB Neck ROM: Full    Dental  (+) Teeth Intact, Loose,    Pulmonary neg pulmonary ROS,    Pulmonary exam normal breath sounds clear to auscultation       Cardiovascular hypertension, Pt. on medications and Pt. on home beta blockers Normal cardiovascular exam Rhythm:Regular Rate:Normal     Neuro/Psych  Headaches, PSYCHIATRIC DISORDERS Anxiety Depression    GI/Hepatic Neg liver ROS, GERD  Medicated and Controlled,  Endo/Other  Hypothyroidism Morbid obesity  Renal/GU Renal diseaseNephrolithiasis  negative genitourinary   Musculoskeletal negative musculoskeletal ROS (+)   Abdominal (+) + obese,   Peds  Hematology negative hematology ROS (+)   Anesthesia Other Findings   Reproductive/Obstetrics                            Anesthesia Physical Anesthesia Plan  ASA: III  Anesthesia Plan: General   Post-op Pain Management:    Induction: Intravenous  Airway Management Planned: LMA and Oral ETT  Additional Equipment: None  Intra-op Plan:   Post-operative Plan: Extubation in OR  Informed Consent: I have reviewed the patients History and Physical, chart, labs and discussed the procedure including the risks, benefits and alternatives for the proposed anesthesia with the patient or authorized representative who has indicated his/her understanding and acceptance.   Dental advisory given  Plan Discussed with: CRNA and Surgeon  Anesthesia Plan Comments:         Anesthesia Quick Evaluation                                   Anesthesia Evaluation  Patient identified by MRN, date of birth, ID  band Patient awake    Reviewed: Allergy & Precautions, NPO status , Patient's Chart, lab work & pertinent test results, reviewed documented beta blocker date and time   History of Anesthesia Complications (+) PONV and history of anesthetic complications  Airway Mallampati: III  TM Distance: >3 FB Neck ROM: Full    Dental  (+) Teeth Intact, Loose,    Pulmonary neg pulmonary ROS,    Pulmonary exam normal breath sounds clear to auscultation       Cardiovascular hypertension, Pt. on medications and Pt. on home beta blockers Normal cardiovascular exam Rhythm:Regular Rate:Normal     Neuro/Psych  Headaches, PSYCHIATRIC DISORDERS Anxiety Depression    GI/Hepatic Neg liver ROS, GERD  Medicated and Controlled,  Endo/Other  Hypothyroidism Morbid obesity  Renal/GU Renal diseaseNephrolithiasis  negative genitourinary   Musculoskeletal negative musculoskeletal ROS (+)   Abdominal (+) + obese,   Peds  Hematology negative hematology ROS (+)   Anesthesia Other Findings   Reproductive/Obstetrics                            Anesthesia Physical Anesthesia Plan  ASA: III  Anesthesia Plan: General   Post-op Pain Management:    Induction: Intravenous  Airway Management Planned: LMA and Oral ETT  Additional Equipment: None  Intra-op Plan:   Post-operative Plan: Extubation in OR  Informed Consent: I have reviewed the patients History and Physical, chart, labs and discussed the procedure including the risks, benefits and alternatives for the proposed anesthesia with the patient or authorized representative who has indicated his/her understanding and acceptance.   Dental advisory given  Plan Discussed with: CRNA and Surgeon  Anesthesia Plan Comments:         Anesthesia Quick Evaluation                                   Anesthesia Evaluation  Patient identified by MRN, date of birth, ID band Patient  awake    Reviewed: Allergy & Precautions, NPO status , Patient's Chart, lab work & pertinent test results, reviewed documented beta blocker date and time   History of Anesthesia Complications (+) PONV and history of anesthetic complications  Airway Mallampati: III  TM Distance: >3 FB Neck ROM: Full    Dental  (+) Teeth Intact, Loose,    Pulmonary neg pulmonary ROS,    Pulmonary exam normal breath sounds clear to auscultation       Cardiovascular hypertension, Pt. on medications and Pt. on home beta blockers Normal cardiovascular exam Rhythm:Regular Rate:Normal     Neuro/Psych  Headaches, PSYCHIATRIC DISORDERS Anxiety Depression    GI/Hepatic Neg liver ROS, GERD  Medicated and Controlled,  Endo/Other  Hypothyroidism Morbid obesity  Renal/GU Renal diseaseNephrolithiasis  negative genitourinary   Musculoskeletal negative musculoskeletal ROS (+)   Abdominal (+) + obese,   Peds  Hematology negative hematology ROS (+)   Anesthesia Other Findings   Reproductive/Obstetrics                            Anesthesia Physical Anesthesia Plan  ASA: III  Anesthesia Plan: General   Post-op Pain Management:    Induction: Intravenous  Airway Management Planned: LMA and Oral ETT  Additional Equipment: None  Intra-op Plan:   Post-operative Plan: Extubation in OR  Informed Consent: I have reviewed the patients History and Physical, chart, labs and discussed the procedure including the risks, benefits and alternatives for the proposed anesthesia with the patient or authorized representative who has indicated his/her understanding and acceptance.   Dental advisory given  Plan Discussed with: CRNA and Surgeon  Anesthesia Plan Comments:         Anesthesia Quick Evaluation                                   Anesthesia Evaluation  Patient identified by MRN, date of birth, ID band Patient awake    Reviewed: Allergy &  Precautions, NPO status , Patient's Chart, lab work & pertinent test results, reviewed documented beta blocker date and time  History of Anesthesia Complications (+) PONV and history of anesthetic complications  Airway Mallampati: III  TM Distance: >3 FB Neck ROM: Full    Dental  (+) Teeth Intact, Loose,    Pulmonary neg pulmonary ROS,    Pulmonary exam normal breath sounds clear to auscultation       Cardiovascular hypertension, Pt. on medications and Pt. on home beta blockers Normal cardiovascular exam Rhythm:Regular Rate:Normal     Neuro/Psych  Headaches, PSYCHIATRIC DISORDERS Anxiety Depression    GI/Hepatic Neg liver ROS, GERD  Medicated and Controlled,  Endo/Other  Hypothyroidism Morbid obesity  Renal/GU Renal diseaseNephrolithiasis  negative genitourinary   Musculoskeletal negative musculoskeletal ROS (+)   Abdominal (+) + obese,   Peds  Hematology negative hematology ROS (+)   Anesthesia Other Findings   Reproductive/Obstetrics                            Anesthesia Physical Anesthesia Plan  ASA: III  Anesthesia Plan: General   Post-op Pain Management:    Induction: Intravenous  Airway Management Planned: LMA and Oral ETT  Additional Equipment: None  Intra-op Plan:   Post-operative Plan: Extubation in OR  Informed Consent: I have reviewed the patients History and Physical, chart, labs and discussed the procedure including the risks, benefits and alternatives for the proposed anesthesia with the patient or authorized representative who has indicated his/her understanding and acceptance.   Dental advisory given  Plan Discussed with: CRNA and Surgeon  Anesthesia Plan Comments:         Anesthesia Quick Evaluation

## 2015-08-14 NOTE — Anesthesia Postprocedure Evaluation (Signed)
Anesthesia Post Note  Patient: Colleen Cohen  Procedure(s) Performed: Procedure(s) (LRB): Excision of right breast WOUND (Right) APPLICATION OF WOUND VAC (Right)  Patient location during evaluation: PACU Anesthesia Type: General Level of consciousness: awake and alert Pain management: pain level controlled Vital Signs Assessment: post-procedure vital signs reviewed and stable Respiratory status: spontaneous breathing, nonlabored ventilation, respiratory function stable and patient connected to nasal cannula oxygen Cardiovascular status: blood pressure returned to baseline and stable Postop Assessment: no signs of nausea or vomiting Anesthetic complications: no    Last Vitals:  Filed Vitals:   08/14/15 1430 08/14/15 1507  BP: 127/49 142/58  Pulse: 68 68  Temp:  36.9 C  Resp: 14 12    Last Pain:  Filed Vitals:   08/14/15 1515  PainSc: 0-No pain                 Effie Berkshire

## 2015-08-14 NOTE — Brief Op Note (Signed)
08/14/2015  1:06 PM  PATIENT:  Colleen Cohen  65 y.o. female  PRE-OPERATIVE DIAGNOSIS:  right breast wound  POST-OPERATIVE DIAGNOSIS:  right breast wound  PROCEDURE:  Procedure(s): Excision of right breast WOUND (Right) APPLICATION OF A-CELL OF right breast (Right)  SURGEON:  Surgeon(s) and Role:    * Dekota Kirlin S Candee Hoon, DO - Primary  PHYSICIAN ASSISTANT: none  ASSISTANTS: none   ANESTHESIA:   general  EBL:     BLOOD ADMINISTERED:none  DRAINS: none   LOCAL MEDICATIONS USED:  MARCAINE     SPECIMEN:  No Specimen  DISPOSITION OF SPECIMEN:  N/A  COUNTS:  YES  TOURNIQUET:  * No tourniquets in log *  DICTATION: .Dragon Dictation  PLAN OF CARE: Discharge to home after PACU  PATIENT DISPOSITION:  PACU - hemodynamically stable.   Delay start of Pharmacological VTE agent (>24hrs) due to surgical blood loss or risk of bleeding: no

## 2015-08-14 NOTE — Transfer of Care (Signed)
Immediate Anesthesia Transfer of Care Note  Patient: Colleen Cohen  Procedure(s) Performed: Procedure(s): Excision of right breast WOUND (Right) APPLICATION OF WOUND VAC (Right)  Patient Location: PACU  Anesthesia Type:General  Level of Consciousness: awake, alert , oriented and patient cooperative  Airway & Oxygen Therapy: Patient Spontanous Breathing and Patient connected to nasal cannula oxygen  Post-op Assessment: Report given to RN and Post -op Vital signs reviewed and stable  Post vital signs: Reviewed and stable  Last Vitals:  Filed Vitals:   08/14/15 1144  BP: 159/68  Pulse: 65  Temp: 36.9 C  Resp: 16    Last Pain:  Filed Vitals:   08/14/15 1219  PainSc: 6       Patients Stated Pain Goal: 3 ([id number])  Complications: No apparent anesthesia complications

## 2015-08-15 ENCOUNTER — Other Ambulatory Visit (HOSPITAL_COMMUNITY): Payer: Self-pay | Admitting: Hematology & Oncology

## 2015-08-15 ENCOUNTER — Encounter (HOSPITAL_BASED_OUTPATIENT_CLINIC_OR_DEPARTMENT_OTHER): Payer: Self-pay | Admitting: Plastic Surgery

## 2015-08-15 DIAGNOSIS — C50412 Malignant neoplasm of upper-outer quadrant of left female breast: Secondary | ICD-10-CM

## 2015-08-18 ENCOUNTER — Encounter (HOSPITAL_COMMUNITY): Payer: Self-pay | Admitting: Lab

## 2015-08-18 NOTE — Progress Notes (Signed)
Referral sent to [facility].  Records faxed on 7/24.  They will contact patient with appt.

## 2015-08-26 ENCOUNTER — Institutional Professional Consult (permissible substitution): Payer: BLUE CROSS/BLUE SHIELD | Admitting: Radiation Oncology

## 2015-09-18 ENCOUNTER — Other Ambulatory Visit: Payer: Self-pay | Admitting: Plastic Surgery

## 2015-09-18 DIAGNOSIS — S21001A Unspecified open wound of right breast, initial encounter: Secondary | ICD-10-CM

## 2015-09-18 DIAGNOSIS — N6320 Unspecified lump in the left breast, unspecified quadrant: Secondary | ICD-10-CM

## 2015-09-22 ENCOUNTER — Encounter (HOSPITAL_BASED_OUTPATIENT_CLINIC_OR_DEPARTMENT_OTHER)
Admission: RE | Disposition: A | Discharge status: Home / Self Care | Payer: Self-pay | Source: Ambulatory Visit | Attending: Plastic Surgery

## 2015-09-22 ENCOUNTER — Ambulatory Visit (HOSPITAL_BASED_OUTPATIENT_CLINIC_OR_DEPARTMENT_OTHER): Payer: Medicare Other | Admitting: Anesthesiology

## 2015-09-22 ENCOUNTER — Ambulatory Visit (HOSPITAL_BASED_OUTPATIENT_CLINIC_OR_DEPARTMENT_OTHER)
Admission: RE | Admit: 2015-09-22 | Discharge: 2015-09-22 | Disposition: A | Discharge status: Home / Self Care | Payer: Medicare Other | Source: Ambulatory Visit | Attending: Plastic Surgery | Admitting: Plastic Surgery

## 2015-09-22 ENCOUNTER — Encounter (HOSPITAL_BASED_OUTPATIENT_CLINIC_OR_DEPARTMENT_OTHER): Payer: Self-pay | Admitting: *Deleted

## 2015-09-22 DIAGNOSIS — Z79899 Other long term (current) drug therapy: Secondary | ICD-10-CM | POA: Diagnosis not present

## 2015-09-22 DIAGNOSIS — C773 Secondary and unspecified malignant neoplasm of axilla and upper limb lymph nodes: Secondary | ICD-10-CM | POA: Insufficient documentation

## 2015-09-22 DIAGNOSIS — N6320 Unspecified lump in the left breast, unspecified quadrant: Secondary | ICD-10-CM

## 2015-09-22 DIAGNOSIS — F419 Anxiety disorder, unspecified: Secondary | ICD-10-CM | POA: Insufficient documentation

## 2015-09-22 DIAGNOSIS — Z853 Personal history of malignant neoplasm of breast: Secondary | ICD-10-CM | POA: Diagnosis not present

## 2015-09-22 DIAGNOSIS — N641 Fat necrosis of breast: Secondary | ICD-10-CM | POA: Diagnosis not present

## 2015-09-22 DIAGNOSIS — K219 Gastro-esophageal reflux disease without esophagitis: Secondary | ICD-10-CM | POA: Insufficient documentation

## 2015-09-22 DIAGNOSIS — Z6839 Body mass index (BMI) 39.0-39.9, adult: Secondary | ICD-10-CM | POA: Diagnosis not present

## 2015-09-22 DIAGNOSIS — L7682 Other postprocedural complications of skin and subcutaneous tissue: Secondary | ICD-10-CM | POA: Insufficient documentation

## 2015-09-22 DIAGNOSIS — E890 Postprocedural hypothyroidism: Secondary | ICD-10-CM | POA: Diagnosis not present

## 2015-09-22 DIAGNOSIS — N63 Unspecified lump in breast: Secondary | ICD-10-CM | POA: Diagnosis present

## 2015-09-22 DIAGNOSIS — S21001A Unspecified open wound of right breast, initial encounter: Secondary | ICD-10-CM

## 2015-09-22 DIAGNOSIS — M1990 Unspecified osteoarthritis, unspecified site: Secondary | ICD-10-CM | POA: Diagnosis not present

## 2015-09-22 DIAGNOSIS — G43909 Migraine, unspecified, not intractable, without status migrainosus: Secondary | ICD-10-CM | POA: Insufficient documentation

## 2015-09-22 DIAGNOSIS — I10 Essential (primary) hypertension: Secondary | ICD-10-CM | POA: Diagnosis not present

## 2015-09-22 DIAGNOSIS — Y838 Other surgical procedures as the cause of abnormal reaction of the patient, or of later complication, without mention of misadventure at the time of the procedure: Secondary | ICD-10-CM | POA: Insufficient documentation

## 2015-09-22 HISTORY — PX: MASS EXCISION: SHX2000

## 2015-09-22 HISTORY — PX: INCISION AND DRAINAGE OF WOUND: SHX1803

## 2015-09-22 SURGERY — EXCISION MASS
Anesthesia: General | Site: Breast | Laterality: Right

## 2015-09-22 MED ORDER — CHLORHEXIDINE GLUCONATE CLOTH 2 % EX PADS
6.0000 | MEDICATED_PAD | Freq: Once | CUTANEOUS | Status: DC
Start: 2015-09-22 — End: 2015-09-22

## 2015-09-22 MED ORDER — OXYCODONE HCL 5 MG PO TABS
ORAL_TABLET | ORAL | Status: AC
  Filled 2015-09-22: qty 1

## 2015-09-22 MED ORDER — LIDOCAINE-EPINEPHRINE 1 %-1:100000 IJ SOLN
INTRAMUSCULAR | Status: AC
  Filled 2015-09-22: qty 1

## 2015-09-22 MED ORDER — ONDANSETRON HCL 4 MG/2ML IJ SOLN: 4.0000 mg | Freq: Four times a day (QID) | INTRAMUSCULAR | Status: DC | PRN

## 2015-09-22 MED ORDER — DEXAMETHASONE SODIUM PHOSPHATE 4 MG/ML IJ SOLN
INTRAMUSCULAR | Status: DC | PRN
  Administered 2015-09-22: 10 mg via INTRAVENOUS

## 2015-09-22 MED ORDER — FENTANYL CITRATE (PF) 100 MCG/2ML IJ SOLN
INTRAMUSCULAR | Status: AC
  Filled 2015-09-22: qty 2

## 2015-09-22 MED ORDER — PROPOFOL 10 MG/ML IV BOLUS
INTRAVENOUS | Status: DC | PRN
  Administered 2015-09-22: 200 mg via INTRAVENOUS

## 2015-09-22 MED ORDER — FENTANYL CITRATE (PF) 100 MCG/2ML IJ SOLN
25.0000 ug | INTRAMUSCULAR | Status: DC | PRN
  Administered 2015-09-22 (×2): 50 ug via INTRAVENOUS

## 2015-09-22 MED ORDER — LACTATED RINGERS IV SOLN
INTRAVENOUS | Status: DC
  Administered 2015-09-22: 07:00:00 via INTRAVENOUS

## 2015-09-22 MED ORDER — ONDANSETRON HCL 4 MG/2ML IJ SOLN
INTRAMUSCULAR | Status: DC | PRN
Start: 2015-09-22 — End: 2015-09-22
  Administered 2015-09-22: 4 mg via INTRAVENOUS

## 2015-09-22 MED ORDER — CHLORHEXIDINE GLUCONATE CLOTH 2 % EX PADS: 6.0000 | MEDICATED_PAD | Freq: Once | CUTANEOUS | Status: DC

## 2015-09-22 MED ORDER — MIDAZOLAM HCL 2 MG/2ML IJ SOLN
INTRAMUSCULAR | Status: AC
  Filled 2015-09-22: qty 2

## 2015-09-22 MED ORDER — OXYCODONE HCL 5 MG/5ML PO SOLN: 5.0000 mg | Freq: Once | ORAL | Status: AC | PRN

## 2015-09-22 MED ORDER — LIDOCAINE HCL (CARDIAC) 20 MG/ML IV SOLN
INTRAVENOUS | Status: DC | PRN
  Administered 2015-09-22: 50 mg via INTRAVENOUS

## 2015-09-22 MED ORDER — BACITRACIN ZINC 500 UNIT/GM EX OINT
TOPICAL_OINTMENT | CUTANEOUS | Status: AC
  Filled 2015-09-22: qty 0.9

## 2015-09-22 MED ORDER — GLYCOPYRROLATE 0.2 MG/ML IJ SOLN
0.2000 mg | Freq: Once | INTRAMUSCULAR | Status: AC | PRN
  Administered 2015-09-22 (×2): 0.2 mg via INTRAVENOUS

## 2015-09-22 MED ORDER — EPHEDRINE SULFATE 50 MG/ML IJ SOLN
INTRAMUSCULAR | Status: DC | PRN
  Administered 2015-09-22: 15 mg via INTRAVENOUS

## 2015-09-22 MED ORDER — BUPIVACAINE-EPINEPHRINE (PF) 0.25% -1:200000 IJ SOLN
INTRAMUSCULAR | Status: AC
  Filled 2015-09-22: qty 30

## 2015-09-22 MED ORDER — SCOPOLAMINE 1 MG/3DAYS TD PT72
MEDICATED_PATCH | TRANSDERMAL | Status: AC
  Filled 2015-09-22: qty 1

## 2015-09-22 MED ORDER — OXYCODONE HCL 5 MG PO TABS
5.0000 mg | ORAL_TABLET | Freq: Once | ORAL | Status: AC | PRN
  Administered 2015-09-22: 5 mg via ORAL

## 2015-09-22 MED ORDER — CEFAZOLIN SODIUM-DEXTROSE 2-4 GM/100ML-% IV SOLN
2.0000 g | INTRAVENOUS | Status: AC
  Administered 2015-09-22: 2 g via INTRAVENOUS

## 2015-09-22 MED ORDER — CEFAZOLIN SODIUM-DEXTROSE 2-4 GM/100ML-% IV SOLN
INTRAVENOUS | Status: AC
  Filled 2015-09-22: qty 100

## 2015-09-22 MED ORDER — FENTANYL CITRATE (PF) 100 MCG/2ML IJ SOLN
50.0000 ug | INTRAMUSCULAR | Status: DC | PRN
  Administered 2015-09-22: 100 ug via INTRAVENOUS

## 2015-09-22 MED ORDER — SCOPOLAMINE 1 MG/3DAYS TD PT72
1.0000 | MEDICATED_PATCH | Freq: Once | TRANSDERMAL | Status: DC | PRN
  Administered 2015-09-22: 1.5 mg via TRANSDERMAL

## 2015-09-22 MED ORDER — SODIUM CHLORIDE 0.9 % IR SOLN
Status: DC | PRN
  Administered 2015-09-22: 500 mL

## 2015-09-22 MED ORDER — LIDOCAINE-EPINEPHRINE 1 %-1:100000 IJ SOLN
INTRAMUSCULAR | Status: DC | PRN
  Administered 2015-09-22: .5 mL

## 2015-09-22 MED ORDER — MIDAZOLAM HCL 2 MG/2ML IJ SOLN
1.0000 mg | INTRAMUSCULAR | Status: DC | PRN
  Administered 2015-09-22: 2 mg via INTRAVENOUS

## 2015-09-22 SURGICAL SUPPLY — 61 items
BENZOIN TINCTURE PRP APPL 2/3 (GAUZE/BANDAGES/DRESSINGS) IMPLANT
BLADE CLIPPER SURG (BLADE) IMPLANT
BLADE SURG 15 STRL LF DISP TIS (BLADE) ×2 IMPLANT
BLADE SURG 15 STRL SS (BLADE) ×1
BNDG CONFORM 2 STRL LF (GAUZE/BANDAGES/DRESSINGS) IMPLANT
BNDG ELASTIC 2X5.8 VLCR STR LF (GAUZE/BANDAGES/DRESSINGS) IMPLANT
CANISTER SUCT 1200ML W/VALVE (MISCELLANEOUS) ×3 IMPLANT
CHLORAPREP W/TINT 26ML (MISCELLANEOUS) ×3 IMPLANT
CLEANER CAUTERY TIP 5X5 PAD (MISCELLANEOUS) IMPLANT
CORDS BIPOLAR (ELECTRODE) IMPLANT
COVER BACK TABLE 60X90IN (DRAPES) ×3 IMPLANT
COVER MAYO STAND STRL (DRAPES) ×3 IMPLANT
DECANTER SPIKE VIAL GLASS SM (MISCELLANEOUS) IMPLANT
DERMABOND ADVANCED (GAUZE/BANDAGES/DRESSINGS)
DERMABOND ADVANCED .7 DNX12 (GAUZE/BANDAGES/DRESSINGS) IMPLANT
DRAPE LAPAROTOMY 100X72 PEDS (DRAPES) ×3 IMPLANT
DRAPE U-SHAPE 76X120 STRL (DRAPES) IMPLANT
DRSG TEGADERM 2-3/8X2-3/4 SM (GAUZE/BANDAGES/DRESSINGS) IMPLANT
DRSG TEGADERM 4X4.75 (GAUZE/BANDAGES/DRESSINGS) IMPLANT
ELECT COATED BLADE 2.86 ST (ELECTRODE) IMPLANT
ELECT NEEDLE BLADE 2-5/6 (NEEDLE) IMPLANT
ELECT REM PT RETURN 9FT ADLT (ELECTROSURGICAL) ×3
ELECT REM PT RETURN 9FT PED (ELECTROSURGICAL)
ELECTRODE REM PT RETRN 9FT PED (ELECTROSURGICAL) IMPLANT
ELECTRODE REM PT RTRN 9FT ADLT (ELECTROSURGICAL) ×2 IMPLANT
GLOVE BIO SURGEON STRL SZ 6.5 (GLOVE) ×6 IMPLANT
GLOVE BIO SURGEON STRL SZ7.5 (GLOVE) ×3 IMPLANT
GOWN STRL REUS W/ TWL LRG LVL3 (GOWN DISPOSABLE) ×8 IMPLANT
GOWN STRL REUS W/TWL LRG LVL3 (GOWN DISPOSABLE) ×4
NEEDLE HYPO 30GX1 BEV (NEEDLE) IMPLANT
NEEDLE PRECISIONGLIDE 27X1.5 (NEEDLE) ×3 IMPLANT
NS IRRIG 1000ML POUR BTL (IV SOLUTION) IMPLANT
PACK BASIN DAY SURGERY FS (CUSTOM PROCEDURE TRAY) ×3 IMPLANT
PAD CLEANER CAUTERY TIP 5X5 (MISCELLANEOUS)
PENCIL BUTTON HOLSTER BLD 10FT (ELECTRODE) ×3 IMPLANT
RUBBERBAND STERILE (MISCELLANEOUS) IMPLANT
SHEET MEDIUM DRAPE 40X70 STRL (DRAPES) IMPLANT
SLEEVE SCD COMPRESS KNEE MED (MISCELLANEOUS) ×3 IMPLANT
SPONGE GAUZE 2X2 8PLY STRL LF (GAUZE/BANDAGES/DRESSINGS) IMPLANT
SPONGE GAUZE 4X4 12PLY STER LF (GAUZE/BANDAGES/DRESSINGS) IMPLANT
STRIP CLOSURE SKIN 1/2X4 (GAUZE/BANDAGES/DRESSINGS) IMPLANT
SUCTION FRAZIER HANDLE 10FR (MISCELLANEOUS) ×1
SUCTION TUBE FRAZIER 10FR DISP (MISCELLANEOUS) ×2 IMPLANT
SUT MNCRL 6-0 UNDY P1 1X18 (SUTURE) IMPLANT
SUT MNCRL AB 3-0 PS2 18 (SUTURE) IMPLANT
SUT MNCRL AB 4-0 PS2 18 (SUTURE) IMPLANT
SUT MON AB 5-0 P3 18 (SUTURE) IMPLANT
SUT MON AB 5-0 PS2 18 (SUTURE) ×3 IMPLANT
SUT MONOCRYL 6-0 P1 1X18 (SUTURE)
SUT PROLENE 5 0 P 3 (SUTURE) IMPLANT
SUT PROLENE 5 0 PS 2 (SUTURE) IMPLANT
SUT PROLENE 6 0 P 1 18 (SUTURE) IMPLANT
SUT VIC AB 5-0 P-3 18X BRD (SUTURE) IMPLANT
SUT VIC AB 5-0 P3 18 (SUTURE)
SUT VIC AB 5-0 PS2 18 (SUTURE) IMPLANT
SUT VICRYL 4-0 PS2 18IN ABS (SUTURE) IMPLANT
SYR BULB 3OZ (MISCELLANEOUS) ×3 IMPLANT
SYR CONTROL 10ML LL (SYRINGE) ×3 IMPLANT
TOWEL OR 17X24 6PK STRL BLUE (TOWEL DISPOSABLE) ×3 IMPLANT
TRAY DSU PREP LF (CUSTOM PROCEDURE TRAY) IMPLANT
TUBE CONNECTING 20X1/4 (TUBING) ×3 IMPLANT

## 2015-09-22 NOTE — Discharge Instructions (Signed)
No heavy lifting.  Remove 1 cm of packing daily.  May get wet.   Post Anesthesia Home Care Instructions  Activity: Get plenty of rest for the remainder of the day. A responsible adult should stay with you for 24 hours following the procedure.  For the next 24 hours, DO NOT: -Drive a car -Paediatric nurse -Drink alcoholic beverages -Take any medication unless instructed by your physician -Make any legal decisions or sign important papers.  Meals: Start with liquid foods such as gelatin or soup. Progress to regular foods as tolerated. Avoid greasy, spicy, heavy foods. If nausea and/or vomiting occur, drink only clear liquids until the nausea and/or vomiting subsides. Call your physician if vomiting continues.  Special Instructions/Symptoms: Your throat may feel dry or sore from the anesthesia or the breathing tube placed in your throat during surgery. If this causes discomfort, gargle with warm salt water. The discomfort should disappear within 24 hours.  If you had a scopolamine patch placed behind your ear for the management of post- operative nausea and/or vomiting:  1. The medication in the patch is effective for 72 hours, after which it should be removed.  Wrap patch in a tissue and discard in the trash. Wash hands thoroughly with soap and water. 2. You may remove the patch earlier than 72 hours if you experience unpleasant side effects which may include dry mouth, dizziness or visual disturbances. 3. Avoid touching the patch. Wash your hands with soap and water after contact with the patch.

## 2015-09-22 NOTE — Op Note (Signed)
Operative Note   DATE OF OPERATION: 09/22/2015  LOCATION: [place]  SURGICAL DIVISION: Plastic Surgery  PREOPERATIVE DIAGNOSES:  Right breast wound and left breast mass after treatment for left breast cancer  POSTOPERATIVE DIAGNOSES:  same  PROCEDURE:   1. Excision of left breast mass / fat necrosis 3 cm  2. Debridement of right breast wound 1 x 1 x 10 cm  SURGEON: Claire Sanger Dillingham, DO  ASSISTANT: Shawn Rayburn, PA  ANESTHESIA:  General.   COMPLICATIONS: None.   INDICATIONS FOR PROCEDURE:  The patient, Colleen Cohen is a 65 y.o. female born on July 04, 1950, is here for treatment of a left breast mass which is likely fat necrosis from the ultrasound that was done.  Also debridement of the right breast wound after breakdown from breast reduction.  She was treated with a left partial mastectomy for breast cancer and underwent reconstruction with reduction. MRN: [id number]  CONSENT:  Informed consent was obtained directly from the patient. Risks, benefits and alternatives were fully discussed. Specific risks including but not limited to bleeding, infection, hematoma, seroma, scarring, pain, infection, contracture, asymmetry, wound healing problems, and need for further surgery were all discussed. The patient did have an ample opportunity to have questions answered to satisfaction.   DESCRIPTION OF PROCEDURE:  The patient was taken to the operating room. SCDs were placed and IV antibiotics were given. The patient's operative site was prepped and draped in a sterile fashion. A time out was performed and all information was confirmed to be correct.  General anesthesia was administered.  The left breast was opened with a #10 blade at the 7 to 9 o'clock position at the juncture of the areola and breast.  The area of firm necrosis was excised ~ 3 cm in size.  The area was irrigated with antibiotic solution and saline.  Hemostasis was achieved with electrocautery.   The deep layer was closed with 4-0 Monocryl followed by a running subcuticular 5-0 Monocryl.  Dermabond was applied.  Attention was then turned to the right breast the area of the wound 1 x 1 x 10 cm was sharply debrided with a curette and #15 blade to remove the fat necrosis and nonviable fat layer.  The area was irrigaed with saline and antibiotic solution.  The wound was packed with iodoform dressing.  A breast binder was applied.  The patient tolerated the procedure well.  There were no complications. The patient was allowed to wake from anesthesia, extubated and taken to the recovery room in satisfactory condition.

## 2015-09-22 NOTE — Brief Op Note (Signed)
09/22/2015  9:08 AM  PATIENT:  Colleen Cohen  65 y.o. female  PRE-OPERATIVE DIAGNOSIS:  left breast mass  POST-OPERATIVE DIAGNOSIS:  left breast mass  PROCEDURE:  Procedure(s): EXCISION LEFT BREAST MASS (Left) IRRIGATION AND DEBRIDEMENT WOUND (Right)  SURGEON:  Surgeon(s) and Role:    Loel Lofty Dillingham, DO - Primary  PHYSICIAN ASSISTANT: Shawn Rayburn, PA  ASSISTANTS: none   ANESTHESIA:   local and general  EBL:  Total I/O In: 400 [I.V.:400] Out: 10 [Blood:10]  BLOOD ADMINISTERED:none  DRAINS: none   LOCAL MEDICATIONS USED:  LIDOCAINE   SPECIMEN:  Source of Specimen:  left breast mass / necrosis  DISPOSITION OF SPECIMEN:  PATHOLOGY  COUNTS:  YES  TOURNIQUET:  * No tourniquets in log *  DICTATION: .Dragon Dictation  PLAN OF CARE: Discharge to home after PACU  PATIENT DISPOSITION:  PACU - hemodynamically stable.   Delay start of Pharmacological VTE agent (>24hrs) due to surgical blood loss or risk of bleeding: no

## 2015-09-22 NOTE — Transfer of Care (Signed)
Immediate Anesthesia Transfer of Care Note  Patient: Colleen Cohen  Procedure(s) Performed: Procedure(s): EXCISION LEFT BREAST MASS (Left) IRRIGATION AND DEBRIDEMENT WOUND (Right)  Patient Location: PACU  Anesthesia Type:General  Level of Consciousness: awake, alert  and oriented  Airway & Oxygen Therapy: Patient Spontanous Breathing and Patient connected to face mask oxygen  Post-op Assessment: Report given to RN and Post -op Vital signs reviewed and stable  Post vital signs: Reviewed and stable  Last Vitals:  Vitals:   09/22/15 0657  BP: (!) 127/45  Pulse: (!) 50  Resp: 20  Temp: 36.7 C    Last Pain:  Vitals:   09/22/15 0657  TempSrc: Oral         Complications: No apparent anesthesia complications

## 2015-09-22 NOTE — Anesthesia Procedure Notes (Signed)
Procedure Name: LMA Insertion Date/Time: 09/22/2015 8:21 AM Performed by: Melynda Ripple D Pre-anesthesia Checklist: Patient identified, Emergency Drugs available, Suction available and Patient being monitored Patient Re-evaluated:Patient Re-evaluated prior to inductionOxygen Delivery Method: Circle system utilized Preoxygenation: Pre-oxygenation with 100% oxygen Intubation Type: IV induction Ventilation: Mask ventilation without difficulty LMA: LMA inserted LMA Size: 4.0 Number of attempts: 1 Airway Equipment and Method: Bite block Placement Confirmation: positive ETCO2 Tube secured with: Tape Dental Injury: Teeth and Oropharynx as per pre-operative assessment

## 2015-09-22 NOTE — Anesthesia Preprocedure Evaluation (Signed)
Anesthesia Evaluation  Patient identified by MRN, date of birth, ID band Patient awake    Reviewed: Allergy & Precautions, NPO status , Patient's Chart, lab work & pertinent test results  History of Anesthesia Complications (+) PONV  Airway Mallampati: II   Neck ROM: full    Dental   Pulmonary neg pulmonary ROS,    breath sounds clear to auscultation       Cardiovascular hypertension,  Rhythm:regular Rate:Normal     Neuro/Psych  Headaches, PSYCHIATRIC DISORDERS Anxiety Depression    GI/Hepatic GERD  ,  Endo/Other  Hypothyroidism Morbid obesity  Renal/GU stones     Musculoskeletal  (+) Arthritis ,   Abdominal   Peds  Hematology   Anesthesia Other Findings   Reproductive/Obstetrics Breast CA                             Anesthesia Physical Anesthesia Plan  ASA: II  Anesthesia Plan: General   Post-op Pain Management:    Induction: Intravenous  Airway Management Planned: LMA  Additional Equipment:   Intra-op Plan:   Post-operative Plan:   Informed Consent: I have reviewed the patients History and Physical, chart, labs and discussed the procedure including the risks, benefits and alternatives for the proposed anesthesia with the patient or authorized representative who has indicated his/her understanding and acceptance.     Plan Discussed with: CRNA, Anesthesiologist and Surgeon  Anesthesia Plan Comments:         Anesthesia Quick Evaluation

## 2015-09-22 NOTE — H&P (Signed)
Colleen Cohen is an 65 y.o. female.   Chief Complaint: right breast wound and left breast mass HPI: The patient is a 65 yrs old wf here for treatment of a left breast mass and right breast wound.  She underwent a partial mastectomy of the left breast and has a firm area that is likely fat necrosis.  Negative from U/S several weeks ago.  The right breast has a wound and she has undergone debridement and here for further debridement.  Past Medical History:  Diagnosis Date  . Anxiety   . Arthritis   . 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 Depression   . GERD (gastroesophageal reflux disease)   . History of kidney stones   . Hypertension   . Hypothyroidism, postsurgical   . Migraine   . Myopia of both eyes   . Nephrolithiasis   . PONV (postoperative nausea and vomiting)   . Wears dentures   . Wears glasses   . Wound of right breast     Past Surgical History:  Procedure Laterality Date  . APPLICATION OF WOUND VAC Right 08/14/2015   Procedure: APPLICATION OF WOUND VAC;  Surgeon: Loel Lofty Linley Moskal, DO;  Location: [place];  Service: Plastics;  Laterality: Right;  . 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 BREAST RECONSTRUCTION Left 07/10/2015   Procedure: REVISION OF LEFT BREAST REDUCTION;  Surgeon: Loel Lofty Lauri Till, DO;  Location: [place];  Service: Plastics;  Laterality: Left;  .  BREAST REDUCTION SURGERY Bilateral 06/17/2015   Procedure: BILATERAL BREAST  REDUCTION  WITH RECONSTRUCTION  ON LEFT  AFTER PARTIAL MASTECTOMY;  Surgeon: Wallace Going, DO;  Location: [place];  Service: Plastics;  Laterality: Bilateral;  . CARPAL TUNNEL RELEASE Right x2  last one 2008  . CHOLECYSTECTOMY  2007  . CYSTO/  URETEROSCOPIC LASER LITHOTRIPSY / STONE EXTRACTION'S/  STENT PLACEMENT  x3  last one 2014  . INCISION AND DRAINAGE OF WOUND Right 08/14/2015   Procedure: Excision of right breast WOUND;  Surgeon: Loel Lofty Laylee Schooley, DO;  Location: [place];  Service: Plastics;  Laterality: Right;  . LUMBAR FUSION  2008   L1 -- L2  . NEGATIVE SLEEP STUDY  2007  (approx)  per pt  . PARTIAL NEPHRECTOMY Left 1972   "part of kidney rotted"  . RE-EXCISION OF BREAST LUMPECTOMY Left 07/10/2015   Procedure: RE-EXCISION OF LEFT BREAST LUMPECTOMY ;  Surgeon: Judeth Horn, MD;  Location: [place];  Service: General;  Laterality: Left;  . RETINAL DETACHMENT SURGERY Right 2015  [facility]   and Repair macular hole  . SHOULDER ARTHROSCOPY Right 2013  . THYROIDECTOMY Bilateral 2012    History reviewed. No pertinent family history. Social History:  reports that she has never smoked. She has never used smokeless tobacco. She reports that she drinks alcohol. She reports that she does not use drugs.  Allergies:  Allergies  Allergen Reactions  . Bactrim [Sulfamethoxazole-Trimethoprim] Other (See Comments)    Lactic acid increase  . Vancomycin Hives  . Codeine Rash    Medications Prior to Admission  Medication Sig Dispense Refill  . diazepam (VALIUM) 2 MG tablet Take 1 tablet (2 mg total) by mouth every 12 (twelve) hours as needed for muscle spasms. 30 tablet 0  . hydrochlorothiazide (HYDRODIURIL) 25 MG tablet Take 25 mg by mouth every morning.     Marland Kitchen levothyroxine (SYNTHROID, LEVOTHROID) 137 MCG tablet Take 137 mcg by mouth daily before breakfast.    . metoprolol succinate  (TOPROL-XL) 50 MG 24 hr tablet Take 50 mg by mouth every morning. Take with or immediately following a meal.    . pantoprazole (PROTONIX) 40 MG tablet Take 40 mg by mouth every morning.    . Probiotic Product (RA PROBIOTIC GUMMIES PO) Take 1 each by mouth daily.    Marland Kitchen topiramate (TOPAMAX) 100 MG tablet Take 100 mg by mouth at bedtime.  1  . zolpidem (AMBIEN) 10 MG tablet Take 10 mg by mouth at bedtime as needed for sleep.    Marland Kitchen oxyCODONE (OXY IR/ROXICODONE) 5 MG immediate release tablet Take 5 mg by mouth daily as needed.  0    No results found for this or any previous visit (from the past 48 hour(s)). No results found.  Review of Systems  Constitutional: Negative.   HENT: Negative.   Eyes: Negative.   Respiratory: Negative.   Cardiovascular: Negative.   Gastrointestinal: Negative.   Genitourinary: Negative.   Musculoskeletal: Negative.   Skin: Negative.   Neurological: Negative.   Psychiatric/Behavioral: Negative.     Blood pressure (!) 127/45, pulse (!) 50, temperature 98 F (36.7 C), temperature source Oral, resp. rate 20, height 5' 7" (1.702 m), weight 112.7 kg (248 lb 6 oz), SpO2 99 %. Physical Exam  Constitutional: She appears well-developed and well-nourished.  HENT:  Head: Normocephalic and atraumatic.  Eyes: EOM are normal. Pupils are equal, round, and reactive to light.  Cardiovascular: Normal rate.   Respiratory: Effort normal.  GI: Soft.  Musculoskeletal: Normal range of motion.  Neurological: She is alert.  Skin: Skin is warm.  Psychiatric: She has a normal mood and affect. Her behavior is normal. Judgment and thought content normal.     Assessment/Plan Plan for for left breast mass removal and right breast I and D of wound.  Wallace Going, DO 09/22/2015, 8:00 AM

## 2015-09-22 NOTE — Anesthesia Procedure Notes (Signed)
Performed by: Infiniti Hoefling D       

## 2015-09-22 NOTE — Anesthesia Postprocedure Evaluation (Signed)
Anesthesia Post Note  Patient: Colleen Cohen  Procedure(s) Performed: Procedure(s) (LRB): EXCISION LEFT BREAST MASS (Left) IRRIGATION AND DEBRIDEMENT WOUND (Right)  Patient location during evaluation: PACU Anesthesia Type: General Level of consciousness: awake and alert and patient cooperative Pain management: pain level controlled Vital Signs Assessment: post-procedure vital signs reviewed and stable Respiratory status: spontaneous breathing and respiratory function stable Cardiovascular status: stable Anesthetic complications: no    Last Vitals:  Vitals:   09/22/15 0945 09/22/15 1000  BP: 137/84 (!) 141/85  Pulse: 69 68  Resp: 16 (!) 9  Temp:      Last Pain:  Vitals:   09/22/15 1000  TempSrc:   PainSc: [place]

## 2015-09-23 ENCOUNTER — Encounter (HOSPITAL_BASED_OUTPATIENT_CLINIC_OR_DEPARTMENT_OTHER): Payer: Self-pay | Admitting: Plastic Surgery

## 2015-10-20 ENCOUNTER — Encounter (HOSPITAL_COMMUNITY): Payer: Self-pay | Admitting: Hematology & Oncology

## 2015-10-20 ENCOUNTER — Encounter (HOSPITAL_COMMUNITY): Payer: BLUE CROSS/BLUE SHIELD | Attending: Hematology & Oncology | Admitting: Hematology & Oncology

## 2015-10-20 VITALS — BP 146/65 | HR 64 | Temp 98.6°F | Resp 16 | Ht 67.0 in | Wt 254.0 lb

## 2015-10-20 DIAGNOSIS — C50412 Malignant neoplasm of upper-outer quadrant of left female breast: Secondary | ICD-10-CM

## 2015-10-20 DIAGNOSIS — Z170 Estrogen receptor positive status [ER+]: Secondary | ICD-10-CM | POA: Diagnosis not present

## 2015-10-20 DIAGNOSIS — R0789 Other chest pain: Secondary | ICD-10-CM | POA: Diagnosis not present

## 2015-10-20 DIAGNOSIS — Z9012 Acquired absence of left breast and nipple: Secondary | ICD-10-CM

## 2015-10-20 DIAGNOSIS — Z9889 Other specified postprocedural states: Secondary | ICD-10-CM

## 2015-10-20 MED ORDER — OXYCODONE HCL 5 MG PO TABS: 5.0000 mg | ORAL_TABLET | ORAL | 0 refills | Status: DC | PRN

## 2015-10-20 NOTE — Patient Instructions (Signed)
[place] at [facility] Discharge Instructions  RECOMMENDATIONS MADE BY THE CONSULTANT AND ANY TEST RESULTS WILL BE SENT TO YOUR REFERRING PHYSICIAN.  You saw Dr. Whitney Muse today. Follow up in November with Dr. Whitney Muse. You are being referred to [facility] plastic surgery.  Thank you for choosing [place] at [facility] to provide your oncology and hematology care.  To afford each patient quality time with our provider, please arrive at least 15 minutes before your scheduled appointment time.   Beginning January 23rd 2017 lab work for the [facility] will be done in the  Main lab at [facility] on 1st floor. If you have a lab appointment with the [facility] please come in thru the  Main Entrance and check in at the main information desk  You need to re-schedule your appointment should you arrive 10 or more minutes late.  We strive to give you quality time with our providers, and arriving late affects you and other patients whose appointments are after yours.  Also, if you no show three or more times for appointments you may be dismissed from the clinic at the providers discretion.     Again, thank you for choosing [facility].  Our hope is that these requests will decrease the amount of time that you wait before being seen by our physicians.       _____________________________________________________________  Should you have questions after your visit to [facility], please contact our office at [phone] between the hours of 8:30 a.m. and 4:30 p.m.  Voicemails left after 4:30 p.m. will not be returned until the following business day.  For prescription refill requests, have your pharmacy contact our office.         Resources For Cancer Patients and their Caregivers ? American Cancer Society: Can assist with transportation, wigs, general needs, runs Look Good Feel Better.        267-763-3130 ? Cancer  Care: Provides financial assistance, online support groups, medication/co-pay assistance.  1-800-813-HOPE (229)199-7878) ? [employer] Assists [place] cancer patients and their families through emotional , educational and financial support.  480 705 0010 ? [place] DSS Where to apply for food stamps, Medicaid and utility assistance. 317-793-8740 ? [facility]: Transportation to medical appointments. 615-564-2932 ? Social Security Administration: May apply for disability if have a Stage IV cancer. 9713384455 631-165-1363 ? [employer], Disability and Transit Services: Assists with nutrition, care and transit needs. [place] Support Programs: @10RELATIVEDAYS @ > Cancer Support Group  2nd Tuesday of the month 1pm-2pm, Journey Room  > Creative Journey  3rd Tuesday of the month 1130am-1pm, Journey Room  > Look Good Feel Better  1st Wednesday of the month 10am-12 noon, Journey Room (Call [place] to register [phone])

## 2015-10-20 NOTE — Progress Notes (Signed)
Colleen Cohen  Progress Note  Patient Care Team: [name], MD as PCP - General (Family Medicine)  CHIEF COMPLAINTS/PURPOSE OF CONSULTATION:    Breast cancer, left breast ([place])   05/05/2015 Procedure    Left needle core biopsy      05/05/2015 Pathology Results    Breast, left, needle core biopsy, 2:00 o'clock - INVASIVE AND IN SITU MAMMARY CARCINOMA WITH CALCIFICATIONS.      06/03/2015 Procedure    Left breast biopsy      06/03/2015 Pathology Results    Breast, left, needle core biopsy, central - ATYPICAL DUCTAL HYPERPLASIA.      06/17/2015 Pathology Results    Breast, partial mastectomy, Left, upper outer quadrant with seed and needle loc - MULTIFOCAL INVASIVE GRADE II LOBULAR CARCINOMA, TWO FOCI MEASURING 1.8 CM AND 1.7 CM IN GREATEST DIMENSION.      06/17/2015 Procedure    Left partial mastectomy by Dr. Judeth Horn      06/17/2015 Initial Diagnosis    Breast cancer ([place])      07/10/2015 Procedure    Left breast re-excision by Dr. Marla Roe      07/10/2015 Pathology Results    Breast, excision, Left Re-excision - BENIGN BREAST TISSUE WITH BIOPSY CHANGES. - NO RESIDUAL TUMOR. - FINAL MARGINS CLEAR.Breast, left, needle core biopsy, outer - INVASIVE LOBULAR CARCINOMA.      08/01/2015 - 08/04/2015 Hospital Admission    Status post bilateral breast reduction  Status post partial mastectomy of left breast  Open wound of right breast  Open breast wound      08/11/2015 Miscellaneous    Mammaprint LOW RISK: No Potential Significant Chemotherapy Benefit      Stage IIA (T1C(2)N1AM0) breast cancer, T1 tumor, 1 node greater than 12m of mets; multifocal, ER+, PR +/-, HER2-, lobular carcinoma.  HISTORY OF PRESENTING ILLNESS:  [name]65y.o. female is here because of Stage IIA (T1c(m)N1aM0) breast cancer, T1 tumor, 1 lymph node involved with metastatic disease with extracapsular extension; multifocal, ER+, PR +/-, HER2-, lobular carcinoma. She comes  here today with her husband and her 65year-old granddaughter.  Ms. CFestais accompanied by her sister.   She has not started radiation therapy yet. She was scheduled to follow with Dr. KSondra Cometoday but the appointment was cancelled. She was rescheduled for October 2nd.   She continues to have wound healing issues with the R breast. She is quite frustrated by this. Today she states "this has been going on for 4 months."  Her sister reports DDarienhas been tired all the time. She is normally up and doing things all the time but has not felt like doing so. They wonder if this is normal.   She is eating and sleeping okay. She was given oxycodone for breast pain.  She notes that some days her pain is minimal to none, but typically after visiting Dr. DEusebio Friendlyoffice and undergoing debridement she has significant discomfort.    MEDICAL HISTORY:  Past Medical History:  Diagnosis Date  . Anxiety   . Arthritis   . Breast cancer ([facility] oncologist-  Anj[facility] ([facility] KSondra Come   dx 04/ 2017-- left breast lobular multifocal carcinoma x2 sites-- Stage 2 w/ mets x1 positive node--  ER/PR+,  HER2 negative --  s/p  left breast lumpectomy with radioactive seed implants and re-excision for positive margin 06-21-2015  . Depression   . GERD (gastroesophageal reflux disease)   . History of kidney  stones   . Hypertension   . Hypothyroidism, postsurgical   . Migraine   . Myopia of both eyes   . Nephrolithiasis   . PONV (postoperative nausea and vomiting)   . Wears dentures   . Wears glasses   . Wound of right breast     SURGICAL HISTORY: Past Surgical History:  Procedure Laterality Date  . APPLICATION OF WOUND VAC Right 08/14/2015   Procedure: APPLICATION OF WOUND VAC;  Surgeon: Loel Lofty Dillingham, DO;  Location: [place];  Service: Plastics;  Laterality: Right;  . BREAST LUMPECTOMY WITH RADIOACTIVE SEED AND SENTINEL LYMPH NODE BIOPSY Left 06/17/2015    Procedure: LEFT BREAST LUMPECTOMY WITH RADIOACTIVE SEED AND SENTINEL LYMPH NODE BIOPSY, NEEDLE LOCALIZATION IN ATYPICAL DUCTAL HYPERPLASIA  AREA OF LEFT BREAST;  Surgeon: Judeth Horn, MD;  Location: [place];  Service: General;  Laterality: Left;  . BREAST RECONSTRUCTION Left 07/10/2015   Procedure: REVISION OF LEFT BREAST REDUCTION;  Surgeon: Loel Lofty Dillingham, DO;  Location: [place];  Service: Plastics;  Laterality: Left;  . BREAST REDUCTION SURGERY Bilateral 06/17/2015   Procedure: BILATERAL BREAST  REDUCTION  WITH RECONSTRUCTION  ON LEFT  AFTER PARTIAL MASTECTOMY;  Surgeon: Wallace Going, DO;  Location: [place];  Service: Plastics;  Laterality: Bilateral;  . CARPAL TUNNEL RELEASE Right x2  last one 2008  . CHOLECYSTECTOMY  2007  . CYSTO/  URETEROSCOPIC LASER LITHOTRIPSY / STONE EXTRACTION'S/  STENT PLACEMENT  x3  last one 2014  . INCISION AND DRAINAGE OF WOUND Right 08/14/2015   Procedure: Excision of right breast WOUND;  Surgeon: Loel Lofty Dillingham, DO;  Location: [place];  Service: Plastics;  Laterality: Right;  . INCISION AND DRAINAGE OF WOUND Right 09/22/2015   Procedure: IRRIGATION AND DEBRIDEMENT WOUND;  Surgeon: Wallace Going, DO;  Location: [place];  Service: Plastics;  Laterality: Right;  . LUMBAR FUSION  2008   L1 -- L2  . MASS EXCISION Left 09/22/2015   Procedure: EXCISION LEFT BREAST MASS;  Surgeon: Wallace Going, DO;  Location: [place];  Service: Plastics;  Laterality: Left;  . NEGATIVE SLEEP STUDY  2007  (approx)  per pt  . PARTIAL NEPHRECTOMY Left 1972   "part of kidney rotted"  . RE-EXCISION OF BREAST LUMPECTOMY Left 07/10/2015   Procedure: RE-EXCISION OF LEFT BREAST LUMPECTOMY ;  Surgeon: Judeth Horn, MD;  Location: [place];  Service: General;  Laterality: Left;  . RETINAL DETACHMENT SURGERY Right 2015  [facility]   and Repair macular hole  . SHOULDER ARTHROSCOPY Right 2013  .  THYROIDECTOMY Bilateral 2012    SOCIAL HISTORY: Social History   Social History  . Marital status: Married    Spouse name: N/A  . Number of children: N/A  . Years of education: N/A   Occupational History  . Not on file.   Social History Main Topics  . Smoking status: Never Smoker  . Smokeless tobacco: Never Used  . Alcohol use Yes     Comment: rare  . Drug use: No  . Sexual activity: Not on file   Other Topics Concern  . Not on file   Social History Narrative  . No narrative on file   Married 16 years. 9 children. Her husband has 2 children, 1 grandchild. 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 Born in [place].  Hobbies include bowling, travelling, shopping. Never smoker. No alcohol problems. Has  cats; 2 female Persians.  FAMILY HISTORY: History reviewed. No pertinent family history.  Mother and father both passed away at age 72. Mother had kidney cancer and Alzheimer's Father had emphysema; was a heavy smoker. 1 sister who was a Marine scientist; had breast cancer at age 65. She had radiation treatment, and took the pills for 5 years.  ALLERGIES:  is allergic to bactrim [sulfamethoxazole-trimethoprim]; vancomycin; and codeine.  MEDICATIONS:  Current Outpatient Prescriptions  Medication Sig Dispense Refill  . diazepam (VALIUM) 10 MG tablet Take 10 mg by mouth 2 (two) times daily.  1  . hydrochlorothiazide (HYDRODIURIL) 25 MG tablet Take 25 mg by mouth every morning.     Marland Kitchen levothyroxine (SYNTHROID, LEVOTHROID) 125 MCG tablet Take 125 mcg by mouth daily.  3  . metoprolol succinate (TOPROL-XL) 50 MG 24 hr tablet Take 50 mg by mouth every morning. Take with or immediately following a meal.    . oxyCODONE (OXY IR/ROXICODONE) 5 MG immediate release tablet Take 5 mg by mouth daily as needed.  0  . pantoprazole (PROTONIX) 40 MG tablet Take 40 mg by mouth every morning.    . Probiotic Product (RA PROBIOTIC GUMMIES PO) Take 1 each by mouth daily.    Marland Kitchen  topiramate (TOPAMAX) 100 MG tablet Take 100 mg by mouth at bedtime.  1  . zolpidem (AMBIEN) 10 MG tablet Take 10 mg by mouth at bedtime as needed for sleep.     No current facility-administered medications for this visit.     Review of Systems  Constitutional: Positive for malaise/fatigue.       Post-surgical pain and healing issues in her R breast.  HENT: Negative.   Eyes: Negative.   Respiratory: Negative.   Cardiovascular: Negative.   Gastrointestinal: Negative.   Genitourinary: Negative.   Musculoskeletal: Negative.   Skin: Negative.   Neurological: Negative.   Endo/Heme/Allergies: Negative.   Psychiatric/Behavioral: Negative.   All other systems reviewed and are negative. 14 point ROS was done and is otherwise as detailed above or in HPI  PHYSICAL EXAMINATION: ECOG PERFORMANCE STATUS: 1 - Symptomatic but completely ambulatory  Vitals:   10/20/15 1331  BP: (!) 146/65  Pulse: 64  Resp: 16  Temp: 98.6 F (37 C)   Filed Weights   10/20/15 1331  Weight: 254 lb (115.2 kg)    Physical Exam  Constitutional: She is oriented to person, place, and time and well-developed, well-nourished, and in no distress.  HENT:  Head: Normocephalic and atraumatic.  Nose: Nose normal.  Mouth/Throat: Oropharynx is clear and moist. No oropharyngeal exudate.  Eyes: Conjunctivae and EOM are normal. Pupils are equal, round, and reactive to light. Right eye exhibits no discharge. Left eye exhibits no discharge. No scleral icterus.  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  Neck: Normal range of motion. Neck supple. No tracheal deviation present. No thyromegaly present.  Cardiovascular: Normal rate, regular rhythm, normal heart sounds and intact distal pulses.  Exam reveals no gallop and no friction rub.   No murmur heard. Pulmonary/Chest: Effort normal and breath sounds normal. She has no wheezes. She has no rales.  R breast covered with bandage.  Abdominal: Soft.  Bowel sounds are normal. She exhibits no distension and no mass. There is no tenderness. There is no rebound and no guarding.  Musculoskeletal: Normal range of motion. She exhibits no edema or tenderness.  Lymphadenopathy:    She has no cervical adenopathy.  Neurological: She is alert and oriented to person,  place, and time. She has normal reflexes. No cranial nerve deficit. Gait normal. Coordination normal.  Skin: Skin is warm and dry. No rash noted.  Psychiatric: Mood, memory, affect and judgment normal.  Nursing note and vitals reviewed.   LABORATORY DATA:  I have reviewed the data as listed Results for GERTRUE, WILLETTE (MRN [id number])   Ref. Range 08/01/2015 14:51 08/01/2015 15:03 08/01/2015 19:04 08/01/2015 19:06  Sodium Latest Ref Range: 135 - 145 mmol/L 137     Potassium Latest Ref Range: 3.5 - 5.1 mmol/L 3.4 (L)     Chloride Latest Ref Range: 101 - 111 mmol/L 104     CO2 Latest Ref Range: 22 - 32 mmol/L 25     BUN Latest Ref Range: 6 - 20 mg/dL 10     Creatinine Latest Ref Range: 0.44 - 1.00 mg/dL 0.87     Calcium Latest Ref Range: 8.9 - 10.3 mg/dL 9.3     EGFR (Non-African Amer.) Latest Ref Range: >60 mL/min >60     EGFR (African American) Latest Ref Range: >60 mL/min >60     Glucose Latest Ref Range: 65 - 99 mg/dL 99     Anion gap Latest Ref Range: 5 - 15  8     Alkaline Phosphatase Latest Ref Range: 38 - 126 U/L 58     Albumin Latest Ref Range: 3.5 - 5.0 g/dL 3.6     AST Latest Ref Range: 15 - 41 U/L 17     ALT Latest Ref Range: 14 - 54 U/L 15     Total Protein Latest Ref Range: 6.5 - 8.1 g/dL 6.9     Total Bilirubin Latest Ref Range: 0.3 - 1.2 mg/dL 0.5     Troponin i, poc Latest Ref Range: 0.00 - 0.08 ng/mL   0.00   Lactic Acid, Venous Latest Ref Range: 0.5 - 1.9 mmol/L  2.00 (HH)  1.26  WBC Latest Ref Range: 4.0 - 10.5 K/uL 6.6     RBC Latest Ref Range: 3.87 - 5.11 MIL/uL 4.46     Hemoglobin Latest Ref Range: 12.0 - 15.0 g/dL 11.2 (L)     HCT Latest Ref Range: 36.0 - 46.0 %  36.7     MCV Latest Ref Range: 78.0 - 100.0 fL 82.3     MCH Latest Ref Range: 26.0 - 34.0 pg 25.1 (L)     MCHC Latest Ref Range: 30.0 - 36.0 g/dL 30.5     RDW Latest Ref Range: 11.5 - 15.5 % 14.1     Platelets Latest Ref Range: 150 - 400 K/uL 306     Neutrophils Latest Units: % 49     Lymphocytes Latest Units: % 40     Monocytes Relative Latest Units: % 6     Eosinophil Latest Units: % 5     Basophil Latest Units: % 0     NEUT# Latest Ref Range: 1.7 - 7.7 K/uL 3.2     Lymphocyte # Latest Ref Range: 0.7 - 4.0 K/uL 2.6     Monocyte # Latest Ref Range: 0.1 - 1.0 K/uL 0.4     Eosinophils Absolute Latest Ref Range: 0.0 - 0.7 K/uL 0.3     Basophils Absolute Latest Ref Range: 0.0 - 0.1 K/uL 0.0        PATHOLOGY             RADIOGRAPHIC STUDIES: I have personally reviewed the radiological images as listed and agreed with the findings in the report. Study Result  CLINICAL DATA:  Palpable mass in the medial left breast. Clinical concern for a postoperative seroma. The patient had a left lumpectomy for breast cancer on 06/17/2015 and bilateral breast reduction on 07/10/2015.  EXAM: ULTRASOUND OF THE LEFT BREAST  COMPARISON:  Previous exam(s).  FINDINGS: Targeted ultrasound is performed, showing heterogeneous echogenic tissue with interspersed mild hypoechoic areas in the 9 o'clock position of the left breast, 4-6 cm from the nipple, at the location of the palpable mass. This has poorly defined margins, measuring approximately 4.0 x 3.7 x 2.6 cm in maximum dimensions. This has a convex anterior margin. No fluid collection is seen.  IMPRESSION: Heterogeneous, primarily echogenic, mass-like area in the 9 o'clock position of the left breast, at the location of the palpable mass. Differential considerations include a large area of fat necrosis or infection. There was no evidence of malignancy in this region on the preoperative MR dated  05/27/2015.  RECOMMENDATION: Left diagnostic mammogram and possible repeat ultrasound at that time as well as clinical follow-up.  I have discussed the findings and recommendations with the patient. Results were also provided in writing at the conclusion of the visit. If applicable, a reminder letter will be sent to the patient regarding the next appointment.  BI-RADS CATEGORY  0: Incomplete. Need additional imaging evaluation and/or prior mammograms for comparison.   Electronically Signed   By: Claudie Revering M.D.   On: 08/01/2015 17:57     ASSESSMENT & PLAN:  Stage IIA (T1C(2)N1AM0) breast cancer, multifocal, ER+, PR +/-, HER2-, lobular carcinoma. Left upper outer quadrant B/L Breast reduction Post surgical complications Mammaprint -- Low Risk  She had two sites of disease, making it multifocal. She has macroscopic LN involvement with extracapsular extension. Mammaprint was low risk, she will proceed with endocrine therapy at the completion of XRT.  09/22/15 Pathology reviewed. Findings were c/w fat necrosis.   The patient has not started XRT. She is scheduled at radiation on 10/27/15. I will plan on seeing her back at the completion of XRT to discuss endocrine therapy.   I have written the patient oxycodone for right breast pain.  She will return for follow up in about 6 weeks (mid-November).  All questions were answered. The patient knows to call the clinic with any problems, questions or concerns.  This document serves as a record of services personally performed by Ancil Linsey, MD. It was created on her behalf by Arlyce Harman, a trained medical scribe. The creation of this record is based on the scribe's personal observations and the provider's statements to them. This document has been checked and approved by the attending provider.  I have reviewed the above documentation for accuracy and completeness and I agree with the above.  This note was  electronically signed.    Molli Hazard, MD

## 2015-10-21 ENCOUNTER — Encounter (HOSPITAL_COMMUNITY): Payer: Self-pay | Admitting: Hematology & Oncology

## 2015-10-26 ENCOUNTER — Ambulatory Visit: Payer: Self-pay | Admitting: Plastic Surgery

## 2015-10-26 DIAGNOSIS — S21001A Unspecified open wound of right breast, initial encounter: Secondary | ICD-10-CM

## 2015-10-29 ENCOUNTER — Encounter (HOSPITAL_COMMUNITY): Payer: Self-pay | Admitting: *Deleted

## 2015-10-29 NOTE — Progress Notes (Signed)
Pt denies SOB, chest pain, and being under the care of a cardiologist. Pt denies having a stress test, echo and cardiac cath. Pt made aware to stop vitamins, fish oil and herbal medications. Do not take any NSAIDs ie: Ibuprofen, Advil, Naproxen, BC and Goody Powder. Pt stated that she has not taken Aspirin in a while. Pt verbalized understanding of all pre-op instructions.

## 2015-10-30 ENCOUNTER — Ambulatory Visit (HOSPITAL_COMMUNITY): Payer: Medicare Other | Admitting: Anesthesiology

## 2015-10-30 ENCOUNTER — Ambulatory Visit (HOSPITAL_COMMUNITY)
Admission: RE | Admit: 2015-10-30 | Discharge: 2015-10-30 | Disposition: A | Discharge status: Home / Self Care | Payer: Medicare Other | Source: Ambulatory Visit | Attending: Plastic Surgery | Admitting: Plastic Surgery

## 2015-10-30 ENCOUNTER — Encounter (HOSPITAL_COMMUNITY): Payer: Self-pay | Admitting: General Practice

## 2015-10-30 ENCOUNTER — Encounter (HOSPITAL_COMMUNITY)
Admission: RE | Disposition: A | Discharge status: Home / Self Care | Payer: Self-pay | Source: Ambulatory Visit | Attending: Plastic Surgery

## 2015-10-30 DIAGNOSIS — K219 Gastro-esophageal reflux disease without esophagitis: Secondary | ICD-10-CM | POA: Insufficient documentation

## 2015-10-30 DIAGNOSIS — F419 Anxiety disorder, unspecified: Secondary | ICD-10-CM | POA: Insufficient documentation

## 2015-10-30 DIAGNOSIS — N641 Fat necrosis of breast: Secondary | ICD-10-CM | POA: Insufficient documentation

## 2015-10-30 DIAGNOSIS — Z882 Allergy status to sulfonamides status: Secondary | ICD-10-CM | POA: Diagnosis not present

## 2015-10-30 DIAGNOSIS — Z881 Allergy status to other antibiotic agents status: Secondary | ICD-10-CM | POA: Insufficient documentation

## 2015-10-30 DIAGNOSIS — I10 Essential (primary) hypertension: Secondary | ICD-10-CM | POA: Insufficient documentation

## 2015-10-30 DIAGNOSIS — Z803 Family history of malignant neoplasm of breast: Secondary | ICD-10-CM | POA: Diagnosis not present

## 2015-10-30 DIAGNOSIS — Z87442 Personal history of urinary calculi: Secondary | ICD-10-CM | POA: Diagnosis not present

## 2015-10-30 DIAGNOSIS — Z853 Personal history of malignant neoplasm of breast: Secondary | ICD-10-CM | POA: Diagnosis not present

## 2015-10-30 DIAGNOSIS — G43909 Migraine, unspecified, not intractable, without status migrainosus: Secondary | ICD-10-CM | POA: Insufficient documentation

## 2015-10-30 DIAGNOSIS — Z9011 Acquired absence of right breast and nipple: Secondary | ICD-10-CM | POA: Insufficient documentation

## 2015-10-30 DIAGNOSIS — M1990 Unspecified osteoarthritis, unspecified site: Secondary | ICD-10-CM | POA: Insufficient documentation

## 2015-10-30 DIAGNOSIS — C773 Secondary and unspecified malignant neoplasm of axilla and upper limb lymph nodes: Secondary | ICD-10-CM | POA: Diagnosis not present

## 2015-10-30 DIAGNOSIS — E890 Postprocedural hypothyroidism: Secondary | ICD-10-CM | POA: Insufficient documentation

## 2015-10-30 DIAGNOSIS — F329 Major depressive disorder, single episode, unspecified: Secondary | ICD-10-CM | POA: Diagnosis not present

## 2015-10-30 DIAGNOSIS — Z7982 Long term (current) use of aspirin: Secondary | ICD-10-CM | POA: Insufficient documentation

## 2015-10-30 DIAGNOSIS — Z8051 Family history of malignant neoplasm of kidney: Secondary | ICD-10-CM | POA: Insufficient documentation

## 2015-10-30 DIAGNOSIS — Z885 Allergy status to narcotic agent status: Secondary | ICD-10-CM | POA: Insufficient documentation

## 2015-10-30 DIAGNOSIS — Z6839 Body mass index (BMI) 39.0-39.9, adult: Secondary | ICD-10-CM | POA: Insufficient documentation

## 2015-10-30 DIAGNOSIS — S21001A Unspecified open wound of right breast, initial encounter: Secondary | ICD-10-CM

## 2015-10-30 HISTORY — PX: APPLICATION OF A-CELL OF CHEST/ABDOMEN: SHX6302

## 2015-10-30 HISTORY — PX: INCISION AND DRAINAGE OF WOUND: SHX1803

## 2015-10-30 LAB — BASIC METABOLIC PANEL
ANION GAP: 11 (ref 5–15)
BUN: 12 mg/dL (ref 6–20)
CALCIUM: 9.2 mg/dL (ref 8.9–10.3)
CO2: 25 mmol/L (ref 22–32)
Chloride: 102 mmol/L (ref 101–111)
Creatinine, Ser: 0.83 mg/dL (ref 0.44–1.00)
GFR calc non Af Amer: >60 mL/min (ref >60)
Glucose, Bld: 114 mg/dL — ABNORMAL HIGH (ref 65–99)
POTASSIUM: 3.2 mmol/L — AB (ref 3.5–5.1)
SODIUM: 138 mmol/L (ref 135–145)

## 2015-10-30 LAB — CBC
HCT: 36.6 % (ref 36.0–46.0)
Hemoglobin: 11.6 g/dL — ABNORMAL LOW (ref 12.0–15.0)
MCH: 25.2 pg — ABNORMAL LOW (ref 26.0–34.0)
MCHC: 31.7 g/dL (ref 30.0–36.0)
MCV: 79.6 fL (ref 78.0–100.0)
PLATELETS: 271 10*3/uL (ref 150–400)
RBC: 4.6 MIL/uL (ref 3.87–5.11)
RDW: 14.7 % (ref 11.5–15.5)
WBC: 8.5 10*3/uL (ref 4.0–10.5)

## 2015-10-30 SURGERY — IRRIGATION AND DEBRIDEMENT WOUND
Anesthesia: General | Site: Breast | Laterality: Right

## 2015-10-30 MED ORDER — HYDROMORPHONE HCL 1 MG/ML IJ SOLN
INTRAMUSCULAR | Status: AC
  Filled 2015-10-30: qty 1

## 2015-10-30 MED ORDER — CEFAZOLIN SODIUM-DEXTROSE 2-4 GM/100ML-% IV SOLN
2.0000 g | INTRAVENOUS | Status: AC
  Administered 2015-10-30: 2 g via INTRAVENOUS
  Filled 2015-10-30: qty 100

## 2015-10-30 MED ORDER — CHLORHEXIDINE GLUCONATE CLOTH 2 % EX PADS: 6.0000 | MEDICATED_PAD | Freq: Once | CUTANEOUS | Status: DC

## 2015-10-30 MED ORDER — 0.9 % SODIUM CHLORIDE (POUR BTL) OPTIME
TOPICAL | Status: DC | PRN
  Administered 2015-10-30: 1000 mL

## 2015-10-30 MED ORDER — MEPERIDINE HCL 25 MG/ML IJ SOLN: 6.2500 mg | INTRAMUSCULAR | Status: DC | PRN

## 2015-10-30 MED ORDER — ROCURONIUM BROMIDE 10 MG/ML (PF) SYRINGE
PREFILLED_SYRINGE | INTRAVENOUS | Status: AC
  Filled 2015-10-30: qty 10

## 2015-10-30 MED ORDER — LIDOCAINE-EPINEPHRINE 1 %-1:100000 IJ SOLN
INTRAMUSCULAR | Status: AC
  Filled 2015-10-30: qty 1

## 2015-10-30 MED ORDER — PROPOFOL 10 MG/ML IV BOLUS
INTRAVENOUS | Status: DC | PRN
  Administered 2015-10-30: 200 mg via INTRAVENOUS

## 2015-10-30 MED ORDER — FENTANYL CITRATE (PF) 100 MCG/2ML IJ SOLN
INTRAMUSCULAR | Status: DC | PRN
  Administered 2015-10-30: 50 ug via INTRAVENOUS

## 2015-10-30 MED ORDER — TRIAMCINOLONE ACETONIDE 40 MG/ML IJ SUSP
INTRAMUSCULAR | Status: DC | PRN
  Administered 2015-10-30: 1 mL via INTRAMUSCULAR

## 2015-10-30 MED ORDER — ONDANSETRON HCL 4 MG/2ML IJ SOLN
INTRAMUSCULAR | Status: DC | PRN
  Administered 2015-10-30: 4 mg via INTRAVENOUS

## 2015-10-30 MED ORDER — ONDANSETRON HCL 4 MG/2ML IJ SOLN
INTRAMUSCULAR | Status: AC
  Filled 2015-10-30: qty 2

## 2015-10-30 MED ORDER — EPHEDRINE SULFATE-NACL 50-0.9 MG/10ML-% IV SOSY
PREFILLED_SYRINGE | INTRAVENOUS | Status: DC | PRN
  Administered 2015-10-30: 10 mg via INTRAVENOUS

## 2015-10-30 MED ORDER — HYDROMORPHONE HCL 1 MG/ML IJ SOLN
INTRAMUSCULAR | Status: AC
  Administered 2015-10-30: 0.5 mg via INTRAVENOUS
  Filled 2015-10-30: qty 1

## 2015-10-30 MED ORDER — CEFAZOLIN SODIUM-DEXTROSE 2-4 GM/100ML-% IV SOLN
INTRAVENOUS | Status: AC
Start: 2015-10-30 — End: 2015-10-30
  Filled 2015-10-30: qty 100

## 2015-10-30 MED ORDER — SCOPOLAMINE 1 MG/3DAYS TD PT72
MEDICATED_PATCH | TRANSDERMAL | Status: AC
  Filled 2015-10-30: qty 1

## 2015-10-30 MED ORDER — PROPOFOL 10 MG/ML IV BOLUS
INTRAVENOUS | Status: AC
  Filled 2015-10-30: qty 20

## 2015-10-30 MED ORDER — HYDROMORPHONE HCL 1 MG/ML IJ SOLN
0.2500 mg | INTRAMUSCULAR | Status: DC | PRN
  Administered 2015-10-30 (×3): 0.5 mg via INTRAVENOUS

## 2015-10-30 MED ORDER — SCOPOLAMINE 1 MG/3DAYS TD PT72
MEDICATED_PATCH | TRANSDERMAL | Status: DC | PRN
  Administered 2015-10-30: 1 via TRANSDERMAL

## 2015-10-30 MED ORDER — FENTANYL CITRATE (PF) 100 MCG/2ML IJ SOLN
INTRAMUSCULAR | Status: AC
  Filled 2015-10-30: qty 2

## 2015-10-30 MED ORDER — MIDAZOLAM HCL 5 MG/5ML IJ SOLN
INTRAMUSCULAR | Status: DC | PRN
  Administered 2015-10-30: 2 mg via INTRAVENOUS

## 2015-10-30 MED ORDER — LACTATED RINGERS IV SOLN
INTRAVENOUS | Status: DC
  Administered 2015-10-30: 08:00:00 via INTRAVENOUS

## 2015-10-30 MED ORDER — SODIUM CHLORIDE 0.9 % IR SOLN
Status: DC | PRN
  Administered 2015-10-30: 500 mL

## 2015-10-30 MED ORDER — PROMETHAZINE HCL 25 MG/ML IJ SOLN: 6.2500 mg | INTRAMUSCULAR | Status: DC | PRN

## 2015-10-30 MED ORDER — LIDOCAINE 2% (20 MG/ML) 5 ML SYRINGE
INTRAMUSCULAR | Status: AC
  Filled 2015-10-30: qty 5

## 2015-10-30 MED ORDER — LIDOCAINE HCL (CARDIAC) 20 MG/ML IV SOLN
INTRAVENOUS | Status: DC | PRN
  Administered 2015-10-30: 100 mg via INTRAVENOUS

## 2015-10-30 MED ORDER — MIDAZOLAM HCL 2 MG/2ML IJ SOLN
INTRAMUSCULAR | Status: AC
  Filled 2015-10-30: qty 2

## 2015-10-30 MED ORDER — TRIAMCINOLONE ACETONIDE 40 MG/ML IJ SUSP
INTRAMUSCULAR | Status: AC
  Filled 2015-10-30: qty 5

## 2015-10-30 SURGICAL SUPPLY — 49 items
APPLICATOR COTTON TIP 6IN STRL (MISCELLANEOUS) ×4 IMPLANT
BAG DECANTER FOR FLEXI CONT (MISCELLANEOUS) ×2 IMPLANT
BENZOIN TINCTURE PRP APPL 2/3 (GAUZE/BANDAGES/DRESSINGS) ×2 IMPLANT
BINDER BREAST XXLRG (GAUZE/BANDAGES/DRESSINGS) ×2 IMPLANT
CANISTER SUCTION 2500CC (MISCELLANEOUS) ×2 IMPLANT
CONT SPEC 4OZ CLIKSEAL STRL BL (MISCELLANEOUS) ×2 IMPLANT
CONT SPEC STER OR (MISCELLANEOUS) IMPLANT
COVER SURGICAL LIGHT HANDLE (MISCELLANEOUS) ×2 IMPLANT
DRAPE IMP U-DRAPE 54X76 (DRAPES) ×2 IMPLANT
DRAPE INCISE IOBAN 66X45 STRL (DRAPES) IMPLANT
DRAPE LAPAROSCOPIC ABDOMINAL (DRAPES) IMPLANT
DRAPE LAPAROTOMY T 98X78 PEDS (DRAPES) ×2 IMPLANT
DRAPE PROXIMA HALF (DRAPES) IMPLANT
DRESSING HYDROCOLLOID 4X4 (GAUZE/BANDAGES/DRESSINGS) IMPLANT
DRSG ADAPTIC 3X8 NADH LF (GAUZE/BANDAGES/DRESSINGS) IMPLANT
DRSG CUTIMED SORBACT 7X9 (GAUZE/BANDAGES/DRESSINGS) ×2 IMPLANT
DRSG PAD ABDOMINAL 8X10 ST (GAUZE/BANDAGES/DRESSINGS) ×2 IMPLANT
DRSG VAC ATS LRG SENSATRAC (GAUZE/BANDAGES/DRESSINGS) IMPLANT
DRSG VAC ATS MED SENSATRAC (GAUZE/BANDAGES/DRESSINGS) IMPLANT
DRSG VAC ATS SM SENSATRAC (GAUZE/BANDAGES/DRESSINGS) IMPLANT
ELECT CAUTERY BLADE 6.4 (BLADE) ×2 IMPLANT
ELECT REM PT RETURN 9FT ADLT (ELECTROSURGICAL) ×2
ELECTRODE REM PT RTRN 9FT ADLT (ELECTROSURGICAL) ×1 IMPLANT
GAUZE SPONGE 4X4 12PLY STRL (GAUZE/BANDAGES/DRESSINGS) ×2 IMPLANT
GLOVE BIO SURGEON STRL SZ 6.5 (GLOVE) ×2 IMPLANT
GLOVE BIOGEL PI IND STRL 7.0 (GLOVE) ×1 IMPLANT
GLOVE BIOGEL PI INDICATOR 7.0 (GLOVE) ×1
GLOVE SURG SS PI 7.0 STRL IVOR (GLOVE) ×2 IMPLANT
GOWN STRL REUS W/ TWL LRG LVL3 (GOWN DISPOSABLE) ×3 IMPLANT
GOWN STRL REUS W/TWL LRG LVL3 (GOWN DISPOSABLE) ×3
KIT BASIN OR (CUSTOM PROCEDURE TRAY) ×2 IMPLANT
KIT ROOM TURNOVER OR (KITS) ×2 IMPLANT
MATRIX SURGICAL PSM 7X10CM (Tissue) ×2 IMPLANT
MICROMATRIX 1000MG (Tissue) ×2 IMPLANT
NEEDLE HYPO 25GX1X1/2 BEV (NEEDLE) ×2 IMPLANT
NS IRRIG 1000ML POUR BTL (IV SOLUTION) ×2 IMPLANT
PACK GENERAL/GYN (CUSTOM PROCEDURE TRAY) ×2 IMPLANT
PACK UNIVERSAL I (CUSTOM PROCEDURE TRAY) IMPLANT
PAD ARMBOARD 7.5X6 YLW CONV (MISCELLANEOUS) ×2 IMPLANT
SOLUTION PARTIC MCRMTRX 1000MG (Tissue) ×1 IMPLANT
STAPLER VISISTAT 35W (STAPLE) IMPLANT
SURGILUBE 2OZ TUBE FLIPTOP (MISCELLANEOUS) ×2 IMPLANT
SUT MNCRL AB 4-0 PS2 18 (SUTURE) IMPLANT
SUT VIC AB 5-0 PS2 18 (SUTURE) IMPLANT
SWAB COLLECTION DEVICE MRSA (MISCELLANEOUS) IMPLANT
SYR CONTROL 10ML LL (SYRINGE) ×2 IMPLANT
TOWEL OR 17X26 10 PK STRL BLUE (TOWEL DISPOSABLE) ×2 IMPLANT
TUBE ANAEROBIC SPECIMEN COL (MISCELLANEOUS) IMPLANT
UNDERPAD 30X30 (UNDERPADS AND DIAPERS) IMPLANT

## 2015-10-30 NOTE — Op Note (Signed)
DATE OF OPERATION: 10/30/2015  LOCATION: [facility]  PREOPERATIVE DIAGNOSIS: right breast wound  POSTOPERATIVE DIAGNOSIS: Same  PROCEDURE: Preparation of right breast wound 1 x 1 x 7 cm for placement of Acell (powder 1 gm and sheet 7 x 10 cm)  SURGEON: Claire Sanger Dillingham, DO  ASSISTANT: Shawn Rayburn, PA  EBL: minimal  CONDITION: Stable  COMPLICATIONS: None  INDICATION: The patient, Colleen Cohen, is a 65 y.o. female born on [birth year], is here for treatment of right breast wound after a mastectomy for breast cancer and symmetry.   PROCEDURE DETAILS:  The patient was seen on the morning of her surgery and marked out for her flap.  The IV antibiotics were given. The patient was taken to the operating room and given a general anesthetic. A standard time out was performed and all information was confirmed by those in the room. SCDs were placed.   The #10 blade and curette were used to debride the right breast wound 1 x 1 x 7 cm.  All of the Acell powder and sheet were placed in the wound. The sheet was rolled to place in the defect.  A sorbact was used to cover the wound and secured with a 5-0 Vicryl.  KY gel was placed.  The patient was allowed to wake up and taken to recovery room in stable condition at the end of the case. The family was notified at the end of the case.

## 2015-10-30 NOTE — H&P (Signed)
Colleen Cohen is an 65 y.o. female.   Chief Complaint: right breast wound HPI: Colleen Cohen is a 65yo female here with her husbandwho was diagnosed with left breast cancer. She underwent left oncoplastic reduction with Dr. Hulen Skains and right breast reduction for symmetry on 06/17/15. She required further excision on the left breast for negative margins on 07/10/15. She healed well following the re-excision on the left breast, but developed necrosis and drainage from the right breast reduction. She was hospitalized for fever and erythema and edema of the right breast and responded well to linezolid. She underwent debridement of the right breast and placement of the VAC on 08/14/15. She developed a firm mass over the left NAC at 10 to 11 o'clock and US of the area indicated fat necrosis, and she underwent excision of the area on 09/22/15 with pathology showing fat necrosis as expected.  4 weeks post op:   Past Medical History:  Diagnosis Date  . Anxiety   . Arthritis   . Breast cancer [facility]) oncologist-  shannon penland ([facility] and Dr Sondra Come    dx 04/ 2017-- left breast lobular multifocal carcinoma x2 sites-- Stage 2 w/ mets x1 positive node--  ER/PR+,  HER2 negative --  s/p  left breast lumpectomy with radioactive seed implants and re-excision for positive margin 06-21-2015  . Depression   . GERD (gastroesophageal reflux disease)   . History of kidney stones   . Hypertension   . Hypothyroidism, postsurgical   . Migraine   . Myopia of both eyes   . Nephrolithiasis   . PONV (postoperative nausea and vomiting)   . Wears dentures   . Wears glasses   . Wound of right breast     Past Surgical History:  Procedure Laterality Date  . APPLICATION OF WOUND VAC Right 08/14/2015   Procedure: APPLICATION OF WOUND VAC;  Surgeon: Loel Lofty Kailyn Vanderslice, DO;  Location: [place];  Service: Plastics;  Laterality: Right;  . BREAST LUMPECTOMY WITH RADIOACTIVE SEED AND SENTINEL LYMPH NODE  BIOPSY Left 06/17/2015   Procedure: LEFT BREAST LUMPECTOMY WITH RADIOACTIVE SEED AND SENTINEL LYMPH NODE BIOPSY, NEEDLE LOCALIZATION IN ATYPICAL DUCTAL HYPERPLASIA  AREA OF LEFT BREAST;  Surgeon: Judeth Horn, MD;  Location: [place];  Service: General;  Laterality: Left;  . BREAST RECONSTRUCTION Left 07/10/2015   Procedure: REVISION OF LEFT BREAST REDUCTION;  Surgeon: Loel Lofty Lakya Schrupp, DO;  Location: [place];  Service: Plastics;  Laterality: Left;  . BREAST REDUCTION SURGERY Bilateral 06/17/2015   Procedure: BILATERAL BREAST  REDUCTION  WITH RECONSTRUCTION  ON LEFT  AFTER PARTIAL MASTECTOMY;  Surgeon: Wallace Going, DO;  Location: [place];  Service: Plastics;  Laterality: Bilateral;  . CARPAL TUNNEL RELEASE Right x2  last one 2008  . CHOLECYSTECTOMY  2007  . CYSTO/  URETEROSCOPIC LASER LITHOTRIPSY / STONE EXTRACTION'S/  STENT PLACEMENT  x3  last one 2014  . INCISION AND DRAINAGE OF WOUND Right 08/14/2015   Procedure: Excision of right breast WOUND;  Surgeon: Loel Lofty Harrol Novello, DO;  Location: [place];  Service: Plastics;  Laterality: Right;  . INCISION AND DRAINAGE OF WOUND Right 09/22/2015   Procedure: IRRIGATION AND DEBRIDEMENT WOUND;  Surgeon: Wallace Going, DO;  Location: [place];  Service: Plastics;  Laterality: Right;  . LUMBAR FUSION  2008   L1 -- L2  . MASS EXCISION Left 09/22/2015   Procedure: EXCISION LEFT BREAST MASS;  Surgeon: Loel Lofty Amairani Shuey,  DO;  Location: [place];  Service: Plastics;  Laterality: Left;  . NEGATIVE SLEEP STUDY  2007  (approx)  per pt  . PARTIAL NEPHRECTOMY Left 1972   "part of kidney rotted"  . RE-EXCISION OF BREAST LUMPECTOMY Left 07/10/2015   Procedure: RE-EXCISION OF LEFT BREAST LUMPECTOMY ;  Surgeon: Judeth Horn, MD;  Location: [place];  Service: General;  Laterality: Left;  . RETINAL DETACHMENT SURGERY Right 2015  [facility]   and Repair macular hole  . SHOULDER  ARTHROSCOPY Right 2013  . THYROIDECTOMY Bilateral 2012    Family History  Problem Relation Age of Onset  . Renal cancer Mother   . Breast cancer Sister    Social History:  reports that she has never smoked. She has never used smokeless tobacco. She reports that she drinks alcohol. She reports that she does not use drugs.  Allergies:  Allergies  Allergen Reactions  . Bactrim [Sulfamethoxazole-Trimethoprim] Other (See Comments)    Lactic acid increase  . Vancomycin Hives  . Codeine Rash    Medications Prior to Admission  Medication Sig Dispense Refill  . aspirin 81 MG chewable tablet Chew 81 mg by mouth daily.    . diazepam (VALIUM) 10 MG tablet Take 10 mg by mouth 2 (two) times daily.  1  . hydrochlorothiazide (HYDRODIURIL) 25 MG tablet Take 25 mg by mouth every morning.     Marland Kitchen levothyroxine (SYNTHROID, LEVOTHROID) 125 MCG tablet Take 125 mcg by mouth daily.  3  . metoprolol succinate (TOPROL-XL) 50 MG 24 hr tablet Take 50 mg by mouth every morning. Take with or immediately following a meal.    . Multiple Vitamin (MULTIVITAMIN) tablet Take 1 tablet by mouth daily.    Marland Kitchen oxyCODONE (OXY IR/ROXICODONE) 5 MG immediate release tablet Take 1 tablet (5 mg total) by mouth every 4 (four) hours as needed. (Patient taking differently: Take 5 mg by mouth every 4 (four) hours as needed for moderate pain. ) 60 tablet 0  . pantoprazole (PROTONIX) 40 MG tablet Take 40 mg by mouth every morning.    . Probiotic Product (RA PROBIOTIC GUMMIES PO) Take 1 each by mouth daily.    Marland Kitchen topiramate (TOPAMAX) 100 MG tablet Take 100 mg by mouth at bedtime.  1  . zolpidem (AMBIEN) 10 MG tablet Take 10 mg by mouth at bedtime as needed for sleep.      No results found for this or any previous visit (from the past 48 hour(s)). No results found.  Review of Systems  Constitutional: Negative.   HENT: Negative.   Eyes: Negative.   Respiratory: Negative.   Cardiovascular: Negative.   Gastrointestinal: Negative.    Genitourinary: Negative.   Musculoskeletal: Negative.   Skin: Negative.   Neurological: Negative.   Psychiatric/Behavioral: Negative.     Blood pressure (!) 145/68, pulse 66, temperature 97.7 F (36.5 C), temperature source Oral, resp. rate 16, height '5\' 7"'$  (1.702 m), weight 115.2 kg (254 lb), SpO2 96 %. Physical Exam  Constitutional: She is oriented to person, place, and time. She appears well-developed and well-nourished.  HENT:  Head: Normocephalic and atraumatic.  Eyes: EOM are normal. Pupils are equal, round, and reactive to light.  Cardiovascular: Normal rate.   Respiratory: Effort normal.  GI: Soft.  Neurological: She is alert and oriented to person, place, and time.  Skin: Skin is warm.  Psychiatric: She has a normal mood and affect. Her behavior is normal. Judgment and thought content normal.     Assessment/Plan  Debridement of right breast with placement of Acell.  Wallace Going, DO 10/30/2015, 7:26 AM

## 2015-10-30 NOTE — Anesthesia Postprocedure Evaluation (Signed)
Anesthesia Post Note  Patient: Colleen Cohen  Procedure(s) Performed: Procedure(s) (LRB): IRRIGATION AND DEBRIDEMENT RIGHT BREAST WOUND (Right) APPLICATION OF A-CELL RIGHT BREAST WOUND (Right)  Patient location during evaluation: PACU Anesthesia Type: General Level of consciousness: awake and alert Pain management: pain level controlled Vital Signs Assessment: post-procedure vital signs reviewed and stable Respiratory status: spontaneous breathing, nonlabored ventilation, respiratory function stable and patient connected to nasal cannula oxygen Cardiovascular status: blood pressure returned to baseline and stable Postop Assessment: no signs of nausea or vomiting Anesthetic complications: no    Last Vitals:  Vitals:   10/30/15 1215 10/30/15 1302  BP: (!) 104/54 (!) 114/57  Pulse: (!) 58 (!) 59  Resp: 14 16  Temp:      Last Pain:  Vitals:   10/30/15 1045  TempSrc:   PainSc: 7                  Desmon Hitchner A

## 2015-10-30 NOTE — Discharge Instructions (Addendum)
KY gel to the right breast daily.

## 2015-10-30 NOTE — Brief Op Note (Signed)
10/30/2015  9:22 AM  PATIENT:  Colleen Cohen  65 y.o. female  PRE-OPERATIVE DIAGNOSIS:  RIGHT BREAST WOUND  POST-OPERATIVE DIAGNOSIS:  RIGHT BREAST WOUND  PROCEDURE:  Procedure(s): IRRIGATION AND DEBRIDEMENT RIGHT BREAST WOUND (Right)  SURGEON:  Surgeon(s) and Role:    * Loel Lofty Hero Mccathern, DO - Primary  PHYSICIAN ASSISTANT: Shawn Rayburn, PA  ASSISTANTS: none   ANESTHESIA:   general  EBL:  No intake/output data recorded.  BLOOD ADMINISTERED:none  DRAINS: none   LOCAL MEDICATIONS USED:  NONE  SPECIMEN:  No Specimen  DISPOSITION OF SPECIMEN:  N/A  COUNTS:  YES  TOURNIQUET:  * No tourniquets in log *  DICTATION: .Dragon Dictation  PLAN OF CARE: Discharge to home after PACU  PATIENT DISPOSITION:  PACU - hemodynamically stable.   Delay start of Pharmacological VTE agent (>24hrs) due to surgical blood loss or risk of bleeding: no

## 2015-10-30 NOTE — Anesthesia Procedure Notes (Signed)
Procedure Name: LMA Insertion Date/Time: 10/30/2015 9:25 AM Performed by: Rush Farmer E Pre-anesthesia Checklist: Patient identified, Emergency Drugs available, Suction available and Patient being monitored Patient Re-evaluated:Patient Re-evaluated prior to inductionOxygen Delivery Method: Circle system utilized Preoxygenation: Pre-oxygenation with 100% oxygen Intubation Type: IV induction LMA: LMA inserted LMA Size: 4.0 Number of attempts: 1 Placement Confirmation: positive ETCO2 and breath sounds checked- equal and bilateral Tube secured with: Tape Dental Injury: Teeth and Oropharynx as per pre-operative assessment

## 2015-10-30 NOTE — Anesthesia Preprocedure Evaluation (Signed)
Anesthesia Evaluation  Patient identified by MRN, date of birth, ID band Patient awake    Reviewed: Allergy & Precautions, NPO status , Patient's Chart, lab work & pertinent test results  History of Anesthesia Complications (+) PONV  Airway Mallampati: II   Neck ROM: full    Dental   Pulmonary neg pulmonary ROS,    breath sounds clear to auscultation       Cardiovascular hypertension,  Rhythm:regular Rate:Normal     Neuro/Psych  Headaches, PSYCHIATRIC DISORDERS Anxiety Depression    GI/Hepatic GERD  ,  Endo/Other  Hypothyroidism Morbid obesity  Renal/GU Renal diseasestones     Musculoskeletal  (+) Arthritis ,   Abdominal   Peds  Hematology   Anesthesia Other Findings   Reproductive/Obstetrics Breast CA                             Anesthesia Physical  Anesthesia Plan  ASA: III  Anesthesia Plan: General   Post-op Pain Management:    Induction: Intravenous  Airway Management Planned: LMA  Additional Equipment:   Intra-op Plan:   Post-operative Plan:   Informed Consent: I have reviewed the patients History and Physical, chart, labs and discussed the procedure including the risks, benefits and alternatives for the proposed anesthesia with the patient or authorized representative who has indicated his/her understanding and acceptance.   Dental advisory given  Plan Discussed with: CRNA  Anesthesia Plan Comments:         Anesthesia Quick Evaluation

## 2015-10-30 NOTE — Interval H&P Note (Signed)
History and Physical Interval Note:  10/30/2015 8:57 AM  Colleen Cohen  has presented today for surgery, with the diagnosis of right breast wound  The various methods of treatment have been discussed with the patient and family. After consideration of risks, benefits and other options for treatment, the patient has consented to  Procedure(s): IRRIGATION AND DEBRIDEMENT RIGHT BREAST WOUND (Right) as a surgical intervention .  The patient's history has been reviewed, patient examined, no change in status, stable for surgery.  I have reviewed the patient's chart and labs.  Questions were answered to the patient's satisfaction.     Wallace Going

## 2015-10-30 NOTE — Transfer of Care (Signed)
Immediate Anesthesia Transfer of Care Note  Patient: Genieva Rochel  Procedure(s) Performed: Procedure(s): IRRIGATION AND DEBRIDEMENT RIGHT BREAST WOUND (Right) APPLICATION OF A-CELL RIGHT BREAST WOUND (Right)  Patient Location: PACU  Anesthesia Type:General  Level of Consciousness: awake, alert  and oriented  Airway & Oxygen Therapy: Patient Spontanous Breathing and Patient connected to nasal cannula oxygen  Post-op Assessment: Report given to RN, Post -op Vital signs reviewed and stable and Patient moving all extremities X 4  Post vital signs: Reviewed and stable  Last Vitals:  Vitals:   10/30/15 0652  BP: (!) 145/68  Pulse: 66  Resp: 16  Temp: 36.5 C    Last Pain:  Vitals:   10/30/15 0712  TempSrc:   PainSc: 8       Patients Stated Pain Goal: 3 ([id number])  Complications: No apparent anesthesia complications

## 2015-10-31 ENCOUNTER — Encounter (HOSPITAL_COMMUNITY): Payer: Self-pay | Admitting: Plastic Surgery

## 2015-12-08 ENCOUNTER — Encounter (HOSPITAL_COMMUNITY): Payer: Self-pay | Admitting: Hematology & Oncology

## 2015-12-08 ENCOUNTER — Encounter (HOSPITAL_COMMUNITY): Payer: Medicare Other | Attending: Hematology & Oncology | Admitting: Hematology & Oncology

## 2015-12-08 VITALS — BP 136/54 | HR 60 | Temp 98.1°F | Resp 18 | Wt 257.4 lb

## 2015-12-08 DIAGNOSIS — C50412 Malignant neoplasm of upper-outer quadrant of left female breast: Secondary | ICD-10-CM | POA: Diagnosis not present

## 2015-12-08 DIAGNOSIS — Z170 Estrogen receptor positive status [ER+]: Secondary | ICD-10-CM | POA: Diagnosis not present

## 2015-12-08 DIAGNOSIS — Z9012 Acquired absence of left breast and nipple: Secondary | ICD-10-CM

## 2015-12-08 DIAGNOSIS — Z79899 Other long term (current) drug therapy: Secondary | ICD-10-CM

## 2015-12-08 MED ORDER — ANASTROZOLE 1 MG PO TABS: 1.0000 mg | ORAL_TABLET | Freq: Every day | ORAL | 3 refills | Status: DC

## 2015-12-08 NOTE — Progress Notes (Signed)
[place]  Progress Note  Patient Care Team: [name], MD as PCP - General (Family Medicine)  CHIEF COMPLAINTS/PURPOSE OF CONSULTATION:    Breast cancer, left breast ([place])   05/05/2015 Procedure    Left needle core biopsy      05/05/2015 Pathology Results    Breast, left, needle core biopsy, 2:00 o'clock - INVASIVE AND IN SITU MAMMARY CARCINOMA WITH CALCIFICATIONS.      06/03/2015 Procedure    Left breast biopsy      06/03/2015 Pathology Results    Breast, left, needle core biopsy, central - ATYPICAL DUCTAL HYPERPLASIA.      06/17/2015 Pathology Results    Breast, partial mastectomy, Left, upper outer quadrant with seed and needle loc - MULTIFOCAL INVASIVE GRADE II LOBULAR CARCINOMA, TWO FOCI MEASURING 1.8 CM AND 1.7 CM IN GREATEST DIMENSION.      06/17/2015 Procedure    Left partial mastectomy by Dr. Judeth Horn      06/17/2015 Initial Diagnosis    Breast cancer ([place])      07/10/2015 Procedure    Left breast re-excision by Dr. Marla Roe      07/10/2015 Pathology Results    Breast, excision, Left Re-excision - BENIGN BREAST TISSUE WITH BIOPSY CHANGES. - NO RESIDUAL TUMOR. - FINAL MARGINS CLEAR.Breast, left, needle core biopsy, outer - INVASIVE LOBULAR CARCINOMA.      08/01/2015 - 08/04/2015 Hospital Admission    Status post bilateral breast reduction  Status post partial mastectomy of left breast  Open wound of right breast  Open breast wound      08/11/2015 Miscellaneous    Mammaprint LOW RISK: No Potential Significant Chemotherapy Benefit      Stage IIA (T1C(2)N1AM0) breast cancer, T1 tumor, 1 node greater than 88m of mets; multifocal, ER+, PR +/-, HER2-, lobular carcinoma.  HISTORY OF PRESENTING ILLNESS:  DRheta Hemmelgarn89y.o. female is here because of Stage IIA (T1c(m)N1aM0) breast cancer, T1 tumor, 1 lymph node involved with metastatic disease with extracapsular extension; multifocal, ER+, PR +/-, HER2-, lobular carcinoma.   Ms.  Cohen accompanied by her husband.   She is scheduled to complete radiation treatment next Monday, 12/15/15. She denies any peeling of her skin. She has cortisone cream and aquaphor lotion.   She has high cholesterol that is already followed.  Her right breast is still not healed. She called the last week of September and left a voicemail about having another breast surgery on October 5th.She notes that she continues to have ongoing issues with her breast not healing. She says that she is trying to remain positive.   She denies excessive fatigue. Appetite is good.  She presents for ongoing consultation and discussion of AI therapy.   MEDICAL HISTORY:  Past Medical History:  Diagnosis Date  . Anxiety   . Arthritis   . 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 Depression   . GERD (gastroesophageal reflux disease)   . History of kidney stones   . Hypertension   . Hypothyroidism, postsurgical   . Migraine   . Myopia of both eyes   . Nephrolithiasis   . PONV (postoperative nausea and vomiting)   . Wears dentures   .  Wears glasses   . Wound of right breast     SURGICAL HISTORY: Past Surgical History:  Procedure Laterality Date  . APPLICATION OF A-CELL OF CHEST/ABDOMEN Right 10/30/2015   Procedure: APPLICATION OF A-CELL RIGHT BREAST WOUND;  Surgeon: Loel Lofty Dillingham, DO;  Location: [place];  Service: Plastics;  Laterality: Right;  . APPLICATION OF WOUND VAC Right 08/14/2015   Procedure: APPLICATION OF WOUND VAC;  Surgeon: Loel Lofty Dillingham, DO;  Location: [place];  Service: Plastics;  Laterality: Right;  . BREAST LUMPECTOMY WITH RADIOACTIVE SEED AND SENTINEL LYMPH NODE BIOPSY Left  06/17/2015   Procedure: LEFT BREAST LUMPECTOMY WITH RADIOACTIVE SEED AND SENTINEL LYMPH NODE BIOPSY, NEEDLE LOCALIZATION IN ATYPICAL DUCTAL HYPERPLASIA  AREA OF LEFT BREAST;  Surgeon: Judeth Horn, MD;  Location: [place];  Service: General;  Laterality: Left;  . BREAST RECONSTRUCTION Left 07/10/2015   Procedure: REVISION OF LEFT BREAST REDUCTION;  Surgeon: Loel Lofty Dillingham, DO;  Location: [place];  Service: Plastics;  Laterality: Left;  . BREAST REDUCTION SURGERY Bilateral 06/17/2015   Procedure: BILATERAL BREAST  REDUCTION  WITH RECONSTRUCTION  ON LEFT  AFTER PARTIAL MASTECTOMY;  Surgeon: Wallace Going, DO;  Location: [place];  Service: Plastics;  Laterality: Bilateral;  . CARPAL TUNNEL RELEASE Right x2  last one 2008  . CHOLECYSTECTOMY  2007  . CYSTO/  URETEROSCOPIC LASER LITHOTRIPSY / STONE EXTRACTION'S/  STENT PLACEMENT  x3  last one 2014  . INCISION AND DRAINAGE OF WOUND Right 08/14/2015   Procedure: Excision of right breast WOUND;  Surgeon: Loel Lofty Dillingham, DO;  Location: [place];  Service: Plastics;  Laterality: Right;  . INCISION AND DRAINAGE OF WOUND Right 09/22/2015   Procedure: IRRIGATION AND DEBRIDEMENT WOUND;  Surgeon: Wallace Going, DO;  Location: [place];  Service: Plastics;  Laterality: Right;  . INCISION AND DRAINAGE OF WOUND Right 10/30/2015   Procedure: IRRIGATION AND DEBRIDEMENT RIGHT BREAST WOUND;  Surgeon: Loel Lofty Dillingham, DO;  Location: [place];  Service: Plastics;  Laterality: Right;  . LUMBAR FUSION  2008   L1 -- L2  . MASS EXCISION Left 09/22/2015   Procedure: EXCISION LEFT BREAST MASS;  Surgeon: Wallace Going, DO;  Location: [place];  Service: Plastics;  Laterality: Left;  . NEGATIVE SLEEP STUDY  2007  (approx)  per pt  . PARTIAL NEPHRECTOMY Left 1972   "part of kidney rotted"  . RE-EXCISION OF BREAST LUMPECTOMY Left 07/10/2015   Procedure: RE-EXCISION OF LEFT  BREAST LUMPECTOMY ;  Surgeon: Judeth Horn, MD;  Location: [place];  Service: General;  Laterality: Left;  . RETINAL DETACHMENT SURGERY Right 2015  [facility]   and Repair macular hole  . SHOULDER ARTHROSCOPY Right 2013  . THYROIDECTOMY Bilateral 2012    SOCIAL HISTORY: Social History   Social History  . Marital status: Married    Spouse name: N/A  . Number of children: N/A  . Years of education: N/A   Occupational History  . Not on file.   Social History Main Topics  . Smoking status: Never Smoker  . Smokeless tobacco: Never Used  . Alcohol use Yes     Comment: rare  . Drug use: No  . Sexual activity: Not on file   Other Topics Concern  . Not on file   Social History Narrative  . No narrative on file   Married 16 years. 9 children. Her husband has 2 children, 1 grandchild.  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 Born in [place].  Hobbies include bowling, travelling, shopping. Never smoker. No alcohol problems. Has cats; 2 female Persians.  FAMILY HISTORY: Family History  Problem Relation Age of Onset  . Renal cancer Mother   . Breast cancer Sister     Mother and father both passed away at age 79. Mother had kidney cancer and Alzheimer's Father had emphysema; was a heavy smoker. 1 sister who was a Marine scientist; had breast cancer at age 20. She had radiation treatment, and took the pills for 5 years.  ALLERGIES:  is allergic to bactrim [sulfamethoxazole-trimethoprim]; vancomycin; and codeine.  MEDICATIONS:  Current Outpatient Prescriptions  Medication Sig Dispense Refill  . aspirin 81 MG chewable tablet Chew 81 mg by mouth daily.    . diazepam (VALIUM) 10 MG tablet Take 10 mg by mouth 2 (two) times daily.  1  . FLUoxetine (PROZAC) 20 MG capsule Take 20 mg by mouth every morning.  3  . hydrochlorothiazide (HYDRODIURIL) 25 MG tablet Take 25 mg by mouth every morning.     Marland Kitchen levothyroxine (SYNTHROID, LEVOTHROID) 125 MCG tablet Take 125 mcg by  mouth daily.  3  . metoprolol succinate (TOPROL-XL) 50 MG 24 hr tablet Take 50 mg by mouth every morning. Take with or immediately following a meal.    . Multiple Vitamin (MULTIVITAMIN) tablet Take 1 tablet by mouth daily.    Marland Kitchen oxyCODONE (OXY IR/ROXICODONE) 5 MG immediate release tablet Take 1 tablet (5 mg total) by mouth every 4 (four) hours as needed. (Patient taking differently: Take 5 mg by mouth every 4 (four) hours as needed for moderate pain. ) 60 tablet 0  . pantoprazole (PROTONIX) 40 MG tablet Take 40 mg by mouth every morning.    . Probiotic Product (RA PROBIOTIC GUMMIES PO) Take 1 each by mouth daily.    Marland Kitchen topiramate (TOPAMAX) 100 MG tablet Take 100 mg by mouth at bedtime.  1  . zolpidem (AMBIEN) 10 MG tablet Take 10 mg by mouth at bedtime as needed for sleep.     No current facility-administered medications for this visit.     Review of Systems  Constitutional: Positive for malaise/fatigue.       Post-surgical pain and healing issues in her R breast.  HENT: Negative.   Eyes: Negative.   Respiratory: Negative.   Cardiovascular: Negative.   Gastrointestinal: Negative.   Genitourinary: Negative.   Musculoskeletal: Negative.   Skin: Negative.        Right breast wound, not healing  Neurological: Negative.   Endo/Heme/Allergies: Negative.   Psychiatric/Behavioral: Negative.   All other systems reviewed and are negative. 14 point ROS was done and is otherwise as detailed above or in HPI  PHYSICAL EXAMINATION: ECOG PERFORMANCE STATUS: 1 - Symptomatic but completely ambulatory  Vitals:   12/08/15 1016  BP: (!) 136/54  Pulse: 60  Resp: 18  Temp: 98.1 F (36.7 C)   Filed Weights   12/08/15 1016  Weight: 257 lb 6.4 oz (116.8 kg)    Physical Exam  Constitutional: She is oriented to person, place, and time and well-developed, well-nourished, and in no distress.  HENT:  Head: Normocephalic and atraumatic.  Nose: Nose normal.  Mouth/Throat: Oropharynx is clear and  moist. No oropharyngeal exudate.  Eyes: Conjunctivae and EOM are normal. Pupils are equal, round, and reactive to light. Right eye exhibits no discharge. Left eye exhibits no discharge. No scleral icterus.  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  Neck: Normal range of motion. Neck supple. No tracheal deviation present. No thyromegaly present.  Cardiovascular: Normal rate, regular rhythm, normal heart sounds and intact distal pulses.  Exam reveals no gallop and no friction rub.   No murmur heard. Pulmonary/Chest: Effort normal and breath sounds normal. She has no wheezes. She has no rales.  Abdominal: Soft. Bowel sounds are normal. She exhibits no distension and no mass. There is no tenderness. There is no rebound and no guarding.  Musculoskeletal: Normal range of motion. She exhibits no edema or tenderness.  Lymphadenopathy:    She has no cervical adenopathy.  Neurological: She is alert and oriented to person, place, and time. She has normal reflexes. No cranial nerve deficit. Gait normal. Coordination normal.  Skin: Skin is warm and dry. No rash noted.  Left breast radiation changes in the supraclavicular region  Psychiatric: Mood, memory, affect and judgment normal.  Nursing note and vitals reviewed.   LABORATORY DATA:  I have reviewed the data as listed Results for Colleen Cohen, Colleen Cohen (MRN [id number]) as of 12/08/2015 10:58  Ref. Range 10/30/2015 07:13  Sodium Latest Ref Range: 135 - 145 mmol/L 138  Potassium Latest Ref Range: 3.5 - 5.1 mmol/L 3.2 (L)  Chloride Latest Ref Range: 101 - 111 mmol/L 102  CO2 Latest Ref Range: 22 - 32 mmol/L 25  BUN Latest Ref Range: 6 - 20 mg/dL 12  Creatinine Latest Ref Range: 0.44 - 1.00 mg/dL 0.83  Calcium Latest Ref Range: 8.9 - 10.3 mg/dL 9.2  EGFR (Non-African Amer.) Latest Ref Range: >60 mL/min >60  EGFR (African American) Latest Ref Range: >60 mL/min >60  Glucose Latest Ref Range: 65 - 99 mg/dL 114 (H)  Anion gap Latest Ref  Range: 5 - 15  11  WBC Latest Ref Range: 4.0 - 10.5 K/uL 8.5  RBC Latest Ref Range: 3.87 - 5.11 MIL/uL 4.60  Hemoglobin Latest Ref Range: 12.0 - 15.0 g/dL 11.6 (L)  HCT Latest Ref Range: 36.0 - 46.0 % 36.6  MCV Latest Ref Range: 78.0 - 100.0 fL 79.6  MCH Latest Ref Range: 26.0 - 34.0 pg 25.2 (L)  MCHC Latest Ref Range: 30.0 - 36.0 g/dL 31.7  RDW Latest Ref Range: 11.5 - 15.5 % 14.7  Platelets Latest Ref Range: 150 - 400 K/uL 271   PATHOLOGY             RADIOGRAPHIC STUDIES: I have personally reviewed the radiological images as listed and agreed with the findings in the report. Study Result   CLINICAL DATA:  Palpable mass in the medial left breast. Clinical concern for a postoperative seroma. The patient had a left lumpectomy for breast cancer on 06/17/2015 and bilateral breast reduction on 07/10/2015.  EXAM: ULTRASOUND OF THE LEFT BREAST  COMPARISON:  Previous exam(s).  FINDINGS: Targeted ultrasound is performed, showing heterogeneous echogenic tissue with interspersed mild hypoechoic areas in the 9 o'clock position of the left breast, 4-6 cm from the nipple, at the location of the palpable mass. This has poorly defined margins, measuring approximately 4.0 x 3.7 x 2.6 cm in maximum dimensions. This has a convex anterior margin. No fluid collection is seen.  IMPRESSION: Heterogeneous, primarily echogenic, mass-like area in the 9 o'clock position of the left breast, at the location of the palpable mass. Differential considerations include a large area of fat necrosis or infection. There was no evidence of malignancy in this region on the preoperative MR dated 05/27/2015.  RECOMMENDATION: Left diagnostic  mammogram and possible repeat ultrasound at that time as well as clinical follow-up.  I have discussed the findings and recommendations with the patient. Results were also provided in writing at the conclusion of the visit. If applicable, a reminder  letter will be sent to the patient regarding the next appointment.  BI-RADS CATEGORY  0: Incomplete. Need additional imaging evaluation and/or prior mammograms for comparison.   Electronically Signed   By: Claudie Revering M.D.   On: 08/01/2015 17:57     ASSESSMENT & PLAN:  Stage IIA (T1C(2)N1AM0) breast cancer, multifocal, ER+, PR +/-, HER2-, lobular carcinoma. Left upper outer quadrant B/L Breast reduction Post surgical complications Mammaprint -- Low Risk  She had two sites of disease, making it multifocal. She has macroscopic LN involvement with extracapsular extension. Mammaprint was low risk, she will proceed with endocrine therapy at the completion of XRT. We discussed AI therapy today. We discussed the risks and benefits of anti-estrogen therapy with aromatase inhibitors. These include but not limited to insomnia, hot flashes, mood changes, vaginal dryness, bone density loss, and weight gain. Although rare, serious side effects including endometrial cancer, risk of blood clots were also discussed. We strongly believe that the benefits far outweigh the risks. Patient understands these risks and consented to starting treatment. Planned treatment duration is 5 years.  She is scheduled to complete radiation treatment next Monday, 12/15/15. Accordingly, I will go ahead and order ArimidexShe will begin taking Arimidex after completing radiation treatment. I have also ordered a bone density scan.   She was given our patient navigator's contact information if she has any further questions or concerns.  She will return for follow up in 8 weeks for labs and assessment of Arimidex tolerance.  Orders Placed This Encounter  Procedures  . DG Bone Density    JH/AMY    Standing Status:   Future    Standing Expiration Date:   12/07/2016    Order Specific Question:   Reason for Exam (SYMPTOM  OR DIAGNOSIS REQUIRED)    Answer:   high risk medication    Order Specific Question:   Preferred  imaging location?    Answer:   [facility]  . CBC with Differential    Standing Status:   Future    Standing Expiration Date:   12/07/2016  . Comprehensive metabolic panel    Standing Status:   Future    Standing Expiration Date:   12/07/2016  . VITAMIN D 25 Hydroxy (Vit-D Deficiency, Fractures)    Standing Status:   Future    Standing Expiration Date:   12/07/2016   Meds ordered this encounter  Medications  . anastrozole (ARIMIDEX) 1 MG tablet    Sig: Take 1 tablet (1 mg total) by mouth daily. Do NOT start until finished with Radiation.    Dispense:  30 tablet    Refill:  3   All questions were answered. The patient knows to call the clinic with any problems, questions or concerns.  This document serves as a record of services personally performed by Ancil Linsey, MD. It was created on her behalf by Arlyce Harman, a trained medical scribe. The creation of this record is based on the scribe's personal observations and the provider's statements to them. This document has been checked and approved by the attending provider.  I have reviewed the above documentation for accuracy and completeness and I agree with the above.  This note was electronically signed.    Molli Hazard, MD

## 2015-12-08 NOTE — Patient Instructions (Addendum)
[place] at [facility] Discharge Instructions  RECOMMENDATIONS MADE BY THE CONSULTANT AND ANY TEST RESULTS WILL BE SENT TO YOUR REFERRING PHYSICIAN.  You saw Dr.Penland today. DEXA scan will be scheduled. Follow up in 8 weeks with labs. Start Arimidex daily after Radiation is finished.  See Amy at checkout for appointments.  Thank you for choosing [place] at [facility] to provide your oncology and hematology care.  To afford each patient quality time with our provider, please arrive at least 15 minutes before your scheduled appointment time.   Beginning January 23rd 2017 lab work for the [facility] will be done in the  Main lab at [facility] on 1st floor. If you have a lab appointment with the [facility] please come in thru the  Main Entrance and check in at the main information desk  You need to re-schedule your appointment should you arrive 10 or more minutes late.  We strive to give you quality time with our providers, and arriving late affects you and other patients whose appointments are after yours.  Also, if you no show three or more times for appointments you may be dismissed from the clinic at the providers discretion.     Again, thank you for choosing [facility].  Our hope is that these requests will decrease the amount of time that you wait before being seen by our physicians.       _____________________________________________________________  Should you have questions after your visit to [facility], please contact our office at [phone] between the hours of 8:30 a.m. and 4:30 p.m.  Voicemails left after 4:30 p.m. will not be returned until the following business day.  For prescription refill requests, have your pharmacy contact our office.         Resources For Cancer Patients and their Caregivers ? American Cancer Society: Can assist with transportation, wigs, general needs, runs  Look Good Feel Better.        914-724-1825 ? Cancer Care: Provides financial assistance, online support groups, medication/co-pay assistance.  1-800-813-HOPE 3477586560) ? [place] Assists [place] cancer patients and their families through emotional , educational and financial support.  938-030-6446 ? [place] DSS Where to apply for food stamps, Medicaid and utility assistance. 873-565-0607 ? [employer]: Transportation to medical appointments. 437-210-9543 ? Social Security Administration: May apply for disability if have a Stage IV cancer. (785) 560-7771 (862)286-9785 ? [employer], Disability and Transit Services: Assists with nutrition, care and transit needs. [place] Support Programs: @10RELATIVEDAYS @ > Cancer Support Group  2nd Tuesday of the month 1pm-2pm, Journey Room  > Creative Journey  3rd Tuesday of the month 1130am-1pm, Journey Room  > Look Good Feel Better  1st Wednesday of the month 10am-12 noon, Journey Room (Call [place] to register [phone])

## 2015-12-09 ENCOUNTER — Telehealth (HOSPITAL_COMMUNITY): Payer: Self-pay | Admitting: Emergency Medicine

## 2015-12-09 NOTE — Telephone Encounter (Signed)
Pt called and wanted a second opinion at [facility] with the doctor she dicussed with Dr Whitney Muse.  I spoke with Dr Whitney Muse.  Its Dr Myriam Jacobson at [facility] plastic surgery.  We will start the referral tomorrow morning.  Pt verbalized understanding.

## 2015-12-10 ENCOUNTER — Encounter (HOSPITAL_COMMUNITY): Payer: Self-pay | Admitting: Lab

## 2015-12-10 NOTE — Progress Notes (Unsigned)
Referral sent to [facility] Dr [facility] plastic surgeon.  Records faxed on 11/15.  They will review and call patient with appt.   Fax [phone] Ph [phone]

## 2015-12-22 ENCOUNTER — Other Ambulatory Visit (HOSPITAL_COMMUNITY): Payer: Medicare Other

## 2015-12-23 ENCOUNTER — Other Ambulatory Visit (HOSPITAL_COMMUNITY): Payer: Medicare Other

## 2016-01-05 ENCOUNTER — Ambulatory Visit (HOSPITAL_COMMUNITY)
Admission: RE | Admit: 2016-01-05 | Discharge: 2016-01-05 | Disposition: A | Discharge status: Home / Self Care | Payer: Medicare Other | Source: Ambulatory Visit | Attending: Hematology & Oncology | Admitting: Hematology & Oncology

## 2016-01-05 DIAGNOSIS — Z170 Estrogen receptor positive status [ER+]: Secondary | ICD-10-CM | POA: Diagnosis not present

## 2016-01-05 DIAGNOSIS — Z780 Asymptomatic menopausal state: Secondary | ICD-10-CM | POA: Diagnosis not present

## 2016-01-05 DIAGNOSIS — C50412 Malignant neoplasm of upper-outer quadrant of left female breast: Secondary | ICD-10-CM

## 2016-01-05 DIAGNOSIS — Z79899 Other long term (current) drug therapy: Secondary | ICD-10-CM

## 2016-02-02 ENCOUNTER — Encounter (HOSPITAL_COMMUNITY): Payer: Medicare Other | Attending: Hematology & Oncology | Admitting: Hematology & Oncology

## 2016-02-02 ENCOUNTER — Encounter (HOSPITAL_COMMUNITY): Payer: Self-pay | Admitting: Hematology & Oncology

## 2016-02-02 ENCOUNTER — Encounter (HOSPITAL_COMMUNITY): Payer: Medicare Other

## 2016-02-02 VITALS — BP 128/57 | HR 57 | Temp 98.1°F | Resp 16 | Wt 265.0 lb

## 2016-02-02 DIAGNOSIS — R635 Abnormal weight gain: Secondary | ICD-10-CM | POA: Diagnosis not present

## 2016-02-02 DIAGNOSIS — C50412 Malignant neoplasm of upper-outer quadrant of left female breast: Secondary | ICD-10-CM

## 2016-02-02 DIAGNOSIS — Z170 Estrogen receptor positive status [ER+]: Principal | ICD-10-CM

## 2016-02-02 DIAGNOSIS — F4321 Adjustment disorder with depressed mood: Secondary | ICD-10-CM

## 2016-02-02 DIAGNOSIS — Z79811 Long term (current) use of aromatase inhibitors: Secondary | ICD-10-CM

## 2016-02-02 DIAGNOSIS — Z9889 Other specified postprocedural states: Secondary | ICD-10-CM

## 2016-02-02 LAB — COMPREHENSIVE METABOLIC PANEL
ALBUMIN: 4 g/dL (ref 3.5–5.0)
ALT: 16 U/L (ref 14–54)
AST: 14 U/L — ABNORMAL LOW (ref 15–41)
Alkaline Phosphatase: 47 U/L (ref 38–126)
Anion gap: 7 (ref 5–15)
BUN: 19 mg/dL (ref 6–20)
CHLORIDE: 103 mmol/L (ref 101–111)
CO2: 29 mmol/L (ref 22–32)
Calcium: 9.1 mg/dL (ref 8.9–10.3)
Creatinine, Ser: 0.85 mg/dL (ref 0.44–1.00)
GFR calc Af Amer: >60 mL/min (ref >60)
GFR calc non Af Amer: >60 mL/min (ref >60)
GLUCOSE: 108 mg/dL — AB (ref 65–99)
POTASSIUM: 3.8 mmol/L (ref 3.5–5.1)
SODIUM: 139 mmol/L (ref 135–145)
Total Bilirubin: 0.5 mg/dL (ref 0.3–1.2)
Total Protein: 7 g/dL (ref 6.5–8.1)

## 2016-02-02 LAB — CBC WITH DIFFERENTIAL/PLATELET
Basophils Absolute: 0 10*3/uL (ref 0.0–0.1)
Basophils Relative: 0 %
EOS ABS: 0.4 10*3/uL (ref 0.0–0.7)
Eosinophils Relative: 6 %
HCT: 40.1 % (ref 36.0–46.0)
Hemoglobin: 12.7 g/dL (ref 12.0–15.0)
LYMPHS ABS: 1.6 10*3/uL (ref 0.7–4.0)
LYMPHS PCT: 26 %
MCH: 26.7 pg (ref 26.0–34.0)
MCHC: 31.7 g/dL (ref 30.0–36.0)
MCV: 84.4 fL (ref 78.0–100.0)
MONOS PCT: 7 %
Monocytes Absolute: 0.4 10*3/uL (ref 0.1–1.0)
NEUTROS ABS: 3.8 10*3/uL (ref 1.7–7.7)
NEUTROS PCT: 61 %
PLATELETS: 226 10*3/uL (ref 150–400)
RBC: 4.75 MIL/uL (ref 3.87–5.11)
RDW: 14.4 % (ref 11.5–15.5)
WBC: 6.2 10*3/uL (ref 4.0–10.5)

## 2016-02-02 MED ORDER — ANASTROZOLE 1 MG PO TABS: 1.0000 mg | ORAL_TABLET | Freq: Every day | ORAL | 2 refills | Status: DC

## 2016-02-02 NOTE — Patient Instructions (Addendum)
[place] at [facility] Discharge Instructions  RECOMMENDATIONS MADE BY THE CONSULTANT AND ANY TEST RESULTS WILL BE SENT TO YOUR REFERRING PHYSICIAN.  You were seen today by Dr. Whitney Muse Follow up in 3 months with lab work We will give you a 90 day supply of Arimidex today   Thank you for choosing [place] at [facility] to provide your oncology and hematology care.  To afford each patient quality time with our provider, please arrive at least 15 minutes before your scheduled appointment time.    If you have a lab appointment with the [facility] please come in thru the  Main Entrance and check in at the main information desk  You need to re-schedule your appointment should you arrive 10 or more minutes late.  We strive to give you quality time with our providers, and arriving late affects you and other patients whose appointments are after yours.  Also, if you no show three or more times for appointments you may be dismissed from the clinic at the providers discretion.     Again, thank you for choosing [facility].  Our hope is that these requests will decrease the amount of time that you wait before being seen by our physicians.       _____________________________________________________________  Should you have questions after your visit to [facility], please contact our office at [phone] between the hours of 8:30 a.m. and 4:30 p.m.  Voicemails left after 4:30 p.m. will not be returned until the following business day.  For prescription refill requests, have your pharmacy contact our office.       Resources For Cancer Patients and their Caregivers ? American Cancer Society: Can assist with transportation, wigs, general needs, runs Look Good Feel Better.        815-282-4437 ? Cancer Care: Provides financial assistance, online support groups, medication/co-pay assistance.  1-800-813-HOPE  (847)784-8295) ? [place] Assists [place] cancer patients and their families through emotional , educational and financial support.  631-277-3694 ? [place] DSS Where to apply for food stamps, Medicaid and utility assistance. 7878071841 ? [employer]: Transportation to medical appointments. 228-155-6913 ? Social Security Administration: May apply for disability if have a Stage IV cancer. 628-725-2321 719-647-1234 ? [employer], Disability and Transit Services: Assists with nutrition, care and transit needs. [place] Support Programs: @10RELATIVEDAYS @ > Cancer Support Group  2nd Tuesday of the month 1pm-2pm, Journey Room  > Creative Journey  3rd Tuesday of the month 1130am-1pm, Journey Room  > Look Good Feel Better  1st Wednesday of the month 10am-12 noon, Journey Room (Call [place] to register [phone])

## 2016-02-02 NOTE — Progress Notes (Signed)
[facility]  Progress Note  Patient Care Team: [name], MD as PCP - General (Family Medicine)  CHIEF COMPLAINTS/PURPOSE OF CONSULTATION:    Breast cancer, left breast ([place])   05/05/2015 Procedure    Left needle core biopsy      05/05/2015 Pathology Results    Breast, left, needle core biopsy, 2:00 o'clock - INVASIVE AND IN SITU MAMMARY CARCINOMA WITH CALCIFICATIONS.      06/03/2015 Procedure    Left breast biopsy      06/03/2015 Pathology Results    Breast, left, needle core biopsy, central - ATYPICAL DUCTAL HYPERPLASIA.      06/17/2015 Pathology Results    Breast, partial mastectomy, Left, upper outer quadrant with seed and needle loc - MULTIFOCAL INVASIVE GRADE II LOBULAR CARCINOMA, TWO FOCI MEASURING 1.8 CM AND 1.7 CM IN GREATEST DIMENSION.      06/17/2015 Procedure    Left partial mastectomy by Dr. Judeth Horn      06/17/2015 Initial Diagnosis    Breast cancer ([place])      07/10/2015 Procedure    Left breast re-excision by Dr. Marla Roe      07/10/2015 Pathology Results    Breast, excision, Left Re-excision - BENIGN BREAST TISSUE WITH BIOPSY CHANGES. - NO RESIDUAL TUMOR. - FINAL MARGINS CLEAR.Breast, left, needle core biopsy, outer - INVASIVE LOBULAR CARCINOMA.      08/01/2015 - 08/04/2015 Hospital Admission    Status post bilateral breast reduction  Status post partial mastectomy of left breast  Open wound of right breast  Open breast wound      08/11/2015 Miscellaneous    Mammaprint LOW RISK: No Potential Significant Chemotherapy Benefit      01/05/2016 Imaging    Bone density- BMD as determined from [facility] Sp[facility] L1-L2 is 1.063 g/cm2 with a T-Score of -0.9. This patient is considered normal according to [facility]) criteria.      Stage IIA (T1C(2)N1AM0) breast cancer, T1 tumor, 1 node greater than 57m of mets; multifocal, ER+, PR +/-, HER2-, lobular carcinoma.  HISTORY OF PRESENTING ILLNESS:  Colleen Matson89 y.o. female is here because of Stage IIA (T1c(m)N1aM0) breast cancer, T1 tumor, 1 lymph node involved with metastatic disease with extracapsular extension; multifocal, ER+, PR +/-, HER2-, lobular carcinoma.  Mammaprint was low risk. She completed XRT. She continues to have problems with a nonhealing wound to the R breast after reduction.   Ms. CNickelsonis accompanied by her husband.  She has started her arimidex inhibitor. She has had intermittent headaches and has gained 15 lbs in 30 days.   She has been having some anxiety and depression due to her bad year and it has been getting worse because she gained weight and she doesn't feel good about herself. She says she eats a lot of junk food late at night, before bed. Pt is tearful and doesn't want to leave the house. Her husband notes that he tries to get her to not eat late at night.   She is having a CT with contrast on Thursday and then she will go see Dr. DMyriam Jacobson She [facility].  No joint pain on her AI, no hot flashes. She is taking it daily.   MEDICAL HISTORY:  Past Medical History:  Diagnosis Date  . Anxiety   . Arthritis   . Breast cancer ([facility] oncologist-  shannon [facility] ([facility] and Dr KSondra Come   dx 04/ 2017--  left breast lobular multifocal carcinoma x2 sites-- Stage 2 w/ mets x1 positive node--  ER/PR+,  HER2 negative --  s/p  left breast lumpectomy with radioactive seed implants and re-excision for positive margin 06-21-2015  . Depression   . GERD (gastroesophageal reflux disease)   . History of kidney stones   . Hypertension   . Hypothyroidism, postsurgical   . Migraine   . Myopia of both eyes   . Nephrolithiasis   . PONV (postoperative nausea and vomiting)   . Wears dentures   . Wears glasses   . Wound of right breast     SURGICAL HISTORY: Past Surgical History:  Procedure Laterality Date  . APPLICATION OF A-CELL OF CHEST/ABDOMEN Right 10/30/2015   Procedure: APPLICATION  OF A-CELL RIGHT BREAST WOUND;  Surgeon: Loel Lofty Dillingham, DO;  Location: [place];  Service: Plastics;  Laterality: Right;  . APPLICATION OF WOUND VAC Right 08/14/2015   Procedure: APPLICATION OF WOUND VAC;  Surgeon: Loel Lofty Dillingham, DO;  Location: [place];  Service: Plastics;  Laterality: Right;  . BREAST LUMPECTOMY WITH RADIOACTIVE SEED AND SENTINEL LYMPH NODE BIOPSY Left 06/17/2015   Procedure: LEFT BREAST LUMPECTOMY WITH RADIOACTIVE SEED AND SENTINEL LYMPH NODE BIOPSY, NEEDLE LOCALIZATION IN ATYPICAL DUCTAL HYPERPLASIA  AREA OF LEFT BREAST;  Surgeon: Judeth Horn, MD;  Location: [place];  Service: General;  Laterality: Left;  . BREAST RECONSTRUCTION Left 07/10/2015   Procedure: REVISION OF LEFT BREAST REDUCTION;  Surgeon: Loel Lofty Dillingham, DO;  Location: [place];  Service: Plastics;  Laterality: Left;  . BREAST REDUCTION SURGERY Bilateral 06/17/2015   Procedure: BILATERAL BREAST  REDUCTION  WITH RECONSTRUCTION  ON LEFT  AFTER PARTIAL MASTECTOMY;  Surgeon: Wallace Going, DO;  Location: [place];  Service: Plastics;  Laterality: Bilateral;  . CARPAL TUNNEL RELEASE Right x2  last one 2008  . CHOLECYSTECTOMY  2007  . CYSTO/  URETEROSCOPIC LASER LITHOTRIPSY / STONE EXTRACTION'S/  STENT PLACEMENT  x3  last one 2014  . INCISION AND DRAINAGE OF WOUND Right 08/14/2015   Procedure: Excision of right breast WOUND;  Surgeon: Loel Lofty Dillingham, DO;  Location: [place];  Service: Plastics;  Laterality: Right;  . INCISION AND DRAINAGE OF WOUND Right 09/22/2015   Procedure: IRRIGATION AND DEBRIDEMENT WOUND;  Surgeon: Wallace Going, DO;  Location: [place];  Service: Plastics;  Laterality: Right;  . INCISION AND DRAINAGE OF WOUND Right 10/30/2015   Procedure: IRRIGATION AND DEBRIDEMENT RIGHT BREAST WOUND;  Surgeon: Loel Lofty Dillingham, DO;  Location: [place];  Service: Plastics;  Laterality: Right;  . LUMBAR FUSION   2008   L1 -- L2  . MASS EXCISION Left 09/22/2015   Procedure: EXCISION LEFT BREAST MASS;  Surgeon: Wallace Going, DO;  Location: [place];  Service: Plastics;  Laterality: Left;  . NEGATIVE SLEEP STUDY  2007  (approx)  per pt  . PARTIAL NEPHRECTOMY Left 1972   "part of kidney rotted"  . RE-EXCISION OF BREAST LUMPECTOMY Left 07/10/2015   Procedure: RE-EXCISION OF LEFT BREAST LUMPECTOMY ;  Surgeon: Judeth Horn, MD;  Location: [place];  Service: General;  Laterality: Left;  . RETINAL DETACHMENT SURGERY Right 2015  [facility]   and Repair macular hole  . SHOULDER ARTHROSCOPY Right 2013  . THYROIDECTOMY Bilateral 2012    SOCIAL HISTORY: Social History   Social History  . Marital status: Married    Spouse name: N/A  . Number of  children: N/A  . Years of education: N/A   Occupational History  . Not on file.   Social History Main Topics  . Smoking status: Never Smoker  . Smokeless tobacco: Never Used  . Alcohol use Yes     Comment: rare  . Drug use: No  . Sexual activity: Not on file   Other Topics Concern  . Not on file   Social History Narrative  . No narrative on file   Married 16 years. 9 children. Her husband has 2 children, 1 grandchild. 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 Born in [place].  Hobbies include bowling, travelling, shopping. Never smoker. No alcohol problems. Has cats; 2 female Persians.  FAMILY HISTORY: Family History  Problem Relation Age of Onset  . Renal cancer Mother   . Breast cancer Sister     Mother and father both passed away at age 51. Mother had kidney cancer and Alzheimer's Father had emphysema; was a heavy smoker. 1 sister who was a Marine scientist; had breast cancer at age 52. She had radiation treatment, and took the pills for 5 years.  ALLERGIES:  is allergic to bactrim [sulfamethoxazole-trimethoprim]; vancomycin; and codeine.  MEDICATIONS:  Current Outpatient Prescriptions  Medication Sig  Dispense Refill  . anastrozole (ARIMIDEX) 1 MG tablet Take 1 mg by mouth.    Marland Kitchen anastrozole (ARIMIDEX) 1 MG tablet Take 1 tablet (1 mg total) by mouth daily. Do NOT start until finished with Radiation. 30 tablet 3  . aspirin 81 MG chewable tablet Chew 81 mg by mouth daily.    . diazepam (VALIUM) 10 MG tablet Take 10 mg by mouth 2 (two) times daily.  1  . FLUoxetine (PROZAC) 20 MG capsule Take 20 mg by mouth every morning.  3  . hydrochlorothiazide (HYDRODIURIL) 25 MG tablet Take 25 mg by mouth every morning.     Marland Kitchen levothyroxine (SYNTHROID, LEVOTHROID) 125 MCG tablet Take 125 mcg by mouth daily.  3  . metoprolol succinate (TOPROL-XL) 50 MG 24 hr tablet Take 50 mg by mouth every morning. Take with or immediately following a meal.    . Multiple Vitamin (MULTIVITAMIN) tablet Take 1 tablet by mouth daily.    Marland Kitchen oxyCODONE (OXY IR/ROXICODONE) 5 MG immediate release tablet Take 1 tablet (5 mg total) by mouth every 4 (four) hours as needed. (Patient taking differently: Take 5 mg by mouth every 4 (four) hours as needed for moderate pain. ) 60 tablet 0  . pantoprazole (PROTONIX) 40 MG tablet Take 40 mg by mouth every morning.    . phenazopyridine (PYRIDIUM) 200 MG tablet Take 200 mg by mouth every 8 (eight) hours as needed.  1  . Probiotic Product (RA PROBIOTIC GUMMIES PO) Take 1 each by mouth daily.    Marland Kitchen topiramate (TOPAMAX) 100 MG tablet Take 100 mg by mouth at bedtime.  1  . zolpidem (AMBIEN) 10 MG tablet Take 10 mg by mouth at bedtime as needed for sleep.     No current facility-administered medications for this visit.     Review of Systems  Constitutional: Negative.        Pos weight gain (15lbs in 30 days)  HENT: Negative.   Eyes: Negative.   Respiratory: Negative.   Cardiovascular: Negative.   Gastrointestinal: Negative.   Genitourinary: Negative.   Musculoskeletal: Negative.   Skin: Negative.   Neurological: Positive for headaches.  Endo/Heme/Allergies: Negative.   Psychiatric/Behavioral:  Positive for depression. The patient is nervous/anxious.   All  other systems reviewed and are negative. 14 point ROS was done and is otherwise as detailed above or in HPI  PHYSICAL EXAMINATION: ECOG PERFORMANCE STATUS: 1 - Symptomatic but completely ambulatory  Vitals:   02/02/16 1321  BP: (!) 128/57  Pulse: (!) 57  Resp: 16  Temp: 98.1 F (36.7 C)   Filed Weights   02/02/16 1321  Weight: 265 lb (120.2 kg)    Physical Exam  Constitutional: She is oriented to person, place, and time and well-developed, well-nourished, and in no distress.  Pt was able to get on exam table without assistance.  Wears glasses.   HENT:  Head: Normocephalic and atraumatic.  Nose: Nose normal.  Mouth/Throat: Oropharynx is clear and moist. No oropharyngeal exudate.  Eyes: Conjunctivae and EOM are normal. Pupils are equal, round, and reactive to light. Right eye exhibits no discharge. Left eye exhibits no discharge. No scleral icterus.  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  Neck: Normal range of motion. Neck supple. No tracheal deviation present. No thyromegaly present.  Cardiovascular: Normal rate, regular rhythm, normal heart sounds and intact distal pulses.  Exam reveals no gallop and no friction rub.   No murmur heard. Pulmonary/Chest: Effort normal and breath sounds normal. She has no wheezes. She has no rales.  Abdominal: Soft. Bowel sounds are normal. She exhibits no distension and no mass. There is no tenderness. There is no rebound and no guarding.  Musculoskeletal: Normal range of motion. She exhibits no edema or tenderness.  Lymphadenopathy:    She has no cervical adenopathy.  Neurological: She is alert and oriented to person, place, and time. She has normal reflexes. No cranial nerve deficit. Gait normal. Coordination normal.  Skin: Skin is warm and dry. No rash noted.  Psychiatric: Mood, memory, affect and judgment normal.  Nursing note and vitals  reviewed.   LABORATORY DATA:  I have reviewed the data as listed Results for ZYLA, DASCENZO (MRN [id number]) as of 02/02/2016 14:16  Ref. Range 02/02/2016 12:32  COMPREHENSIVE METABOLIC PANEL Unknown Rpt (A)  Sodium Latest Ref Range: 135 - 145 mmol/L 139  Potassium Latest Ref Range: 3.5 - 5.1 mmol/L 3.8  Chloride Latest Ref Range: 101 - 111 mmol/L 103  CO2 Latest Ref Range: 22 - 32 mmol/L 29  Glucose Latest Ref Range: 65 - 99 mg/dL 108 (H)  BUN Latest Ref Range: 6 - 20 mg/dL 19  Creatinine Latest Ref Range: 0.44 - 1.00 mg/dL 0.85  Calcium Latest Ref Range: 8.9 - 10.3 mg/dL 9.1  Anion gap Latest Ref Range: 5 - 15  7  Alkaline Phosphatase Latest Ref Range: 38 - 126 U/L 47  Albumin Latest Ref Range: 3.5 - 5.0 g/dL 4.0  AST Latest Ref Range: 15 - 41 U/L 14 (L)  ALT Latest Ref Range: 14 - 54 U/L 16  Total Protein Latest Ref Range: 6.5 - 8.1 g/dL 7.0  Total Bilirubin Latest Ref Range: 0.3 - 1.2 mg/dL 0.5  EGFR (African American) Latest Ref Range: >60 mL/min >60  EGFR (Non-African Amer.) Latest Ref Range: >60 mL/min >60  WBC Latest Ref Range: 4.0 - 10.5 K/uL 6.2  RBC Latest Ref Range: 3.87 - 5.11 MIL/uL 4.75  Hemoglobin Latest Ref Range: 12.0 - 15.0 g/dL 12.7  HCT Latest Ref Range: 36.0 - 46.0 % 40.1  MCV Latest Ref Range: 78.0 - 100.0 fL 84.4  MCH Latest Ref Range: 26.0 - 34.0 pg 26.7  MCHC Latest Ref Range: 30.0 - 36.0 g/dL 31.7  RDW Latest Ref Range: 11.5 - 15.5 % 14.4  Platelets Latest Ref Range: 150 - 400 K/uL 226  Neutrophils Latest Units: % 61  Lymphocytes Latest Units: % 26  Monocytes Relative Latest Units: % 7  Eosinophil Latest Units: % 6  Basophil Latest Units: % 0  NEUT# Latest Ref Range: 1.7 - 7.7 K/uL 3.8  Lymphocyte # Latest Ref Range: 0.7 - 4.0 K/uL 1.6  Monocyte # Latest Ref Range: 0.1 - 1.0 K/uL 0.4  Eosinophils Absolute Latest Ref Range: 0.0 - 0.7 K/uL 0.4  Basophils Absolute Latest Ref Range: 0.0 - 0.1 K/uL 0.0    PATHOLOGY   RADIOGRAPHIC STUDIES: I have  personally reviewed the radiological images as listed and agreed with the findings in the report. Study Result   EXAM: DUAL X-RAY ABSORPTIOMETRY (DXA) FOR BONE MINERAL DENSITY  IMPRESSION: Ordering Physician:  Dr. Patrici Ranks,  Your patient Colleen Cohen completed a BMD test on 01/05/2016 using the [place] (software version: 14.10) manufactured by [employer]. The following summarizes the results of our evaluation. PATIENT BIOGRAPHICAL: Name: Colleen Cohen, Colleen Cohen Patient ID: [id number] Birth Date: May 17, 1950 Height: 67.0 in. Gender: Female Exam Date: 01/05/2016 Weight: 257.0 lbs. Indications: Back surgery, Breast Ca, Caucasian, Height Loss, Low Calcium Intake, Post Menopausal Fractures: Treatments: Anastrozole, Multivitamin DENSITOMETRY RESULTS: Site      Region    Measured Date Measured Age WHO Classification Young Adult T-score BMD         %Change vs. Previous Significant Change (*) [facility] Spine L1-L2 01/05/2016 65.3 Normal -0.9 1.063 g/cm2  DualFemur Neck Left 01/05/2016 65.3 Normal -0.7 0.947 g/cm2 ASSESSMENT: BMD as determined from [facility] L1-L2 is 1.063 g/cm2 with a T-Score of -0.9. This patient is considered normal according to [facility]) criteria. Per official position of the ISCD, it is not possible to quantitatively compare BMD or calculate a LSC between different facilities or devices. (L-3-4 was excluded due to history of surgical repair.) (Patient is not a candidate for FRAX assessment due to normal bone density exam.)  World Health Organization [facility]) criteria for post-menopausal, Caucasian Women: Normal:       T-score at or above -1 SD Osteopenia:   T-score between -1 and -2.5 SD Osteoporosis: T-score at or below -2.5 SD  RECOMMENDATIONS: [place] recommends that FDA-approved medial therapies be considered in postmenopausal women and men age 29 or older with a: 1. Hip or  vertebral (clinical or morphometric) fracture. 2. T-Score of < -2.5 at the spine or hip. 3. Ten-year fracture probability by FRAX of 3% or greater for hip fracture or 20% or greater for major osteoporotic fracture.  All treatment decisions require clinical judgment and consideration of individual patient factors, including patient preferences, co-morbidities, previous drug use, risk factors not captured in the FRAX model (e.g. falls, vitamin D deficiency, increased bone turnover, interval significant decline in bone density) and possible under-or over-estimation of fracture risk by FRAX.  All patients should ensure an adequate intake of dietary calcium (1200 mg/d) and vitamin D (800 IU daily) unless contraindicated.  FOLLOW-UP: People with diagnosed cases of osteoporosis or osteopenia should be regularly tested for bone mineral density. For patients eligible for Medicare, routine testing is allowed once every 2 years. Testing frequency can be increased for patients who have rapidly progressing disease, or for those who are receiving medical therapy to restore bone mass.  I have reviewed this report, and agree with the above findings.  [facility]  Electronically Signed   By: Lahoma Crocker M.D.   On: 01/05/2016 12:48    ASSESSMENT & PLAN:  Stage IIA (T1C(2)N1AM0) breast cancer, multifocal, ER+, PR +/-, HER2-, lobular carcinoma. Left upper outer quadrant B/L Breast reduction Post surgical complications Mammaprint -- Low Risk Arimidex Situational depression Weight gain  She had two sites of disease, making it multifocal. She has macroscopic LN involvement with extracapsular extension. Mammaprint was low risk. She completed adjuvant XRT and currently is on Arimidex. Is realistically doing well.  We discussed that her weight gain is most likely secondary to her eating habits and she is aware of this. We discussed not eating late at night, choices of food.  We discussed potentially increasing or changing her prozac. I have encouraged her to walk. They have free gym memberships through their insurance.   I have recommended a support group to help her cope with this past year, her diagnosis and complication.   She will return for a follow up in 3 months unless she starts doing poorly on her medication. She will continue to follow with Dr. Myriam Jacobson.   All questions were answered. The patient knows to call the clinic with any problems, questions or concerns.  This document serves as a record of services personally performed by Ancil Linsey, MD. It was created on her behalf by Martinique Casey, a trained medical scribe. The creation of this record is based on the scribe's personal observations and the provider's statements to them. This document has been checked and approved by the attending provider.  I have reviewed the above documentation for accuracy and completeness and I agree with the above.  This note was electronically signed.    Molli Hazard, MD

## 2016-02-03 LAB — VITAMIN D 25 HYDROXY (VIT D DEFICIENCY, FRACTURES): VIT D 25 HYDROXY: 15.8 ng/mL — AB (ref 30.0–100.0)

## 2016-02-04 ENCOUNTER — Telehealth (HOSPITAL_COMMUNITY): Payer: Self-pay

## 2016-02-04 DIAGNOSIS — E559 Vitamin D deficiency, unspecified: Secondary | ICD-10-CM

## 2016-02-04 MED ORDER — ERGOCALCIFEROL 1.25 MG (50000 UT) PO CAPS: 50000.0000 [IU] | ORAL_CAPSULE | ORAL | 0 refills | Status: DC

## 2016-02-04 NOTE — Telephone Encounter (Signed)
Left patient message letting her know her Vitamin D was low and oncologist wants her to take Drisdol 50,000 units weekly. Prescription sent to pharmacy. Call cancer center if any questions.

## 2016-04-24 ENCOUNTER — Other Ambulatory Visit (HOSPITAL_COMMUNITY): Payer: Self-pay | Admitting: Hematology & Oncology

## 2016-04-24 DIAGNOSIS — E559 Vitamin D deficiency, unspecified: Secondary | ICD-10-CM

## 2016-04-27 ENCOUNTER — Telehealth (HOSPITAL_COMMUNITY): Payer: Self-pay

## 2016-04-27 DIAGNOSIS — C50412 Malignant neoplasm of upper-outer quadrant of left female breast: Secondary | ICD-10-CM

## 2016-04-27 DIAGNOSIS — E559 Vitamin D deficiency, unspecified: Secondary | ICD-10-CM

## 2016-04-27 DIAGNOSIS — Z170 Estrogen receptor positive status [ER+]: Principal | ICD-10-CM

## 2016-04-27 MED ORDER — ERGOCALCIFEROL 1.25 MG (50000 UT) PO CAPS: 50000.0000 [IU] | ORAL_CAPSULE | ORAL | 0 refills | Status: DC

## 2016-04-27 NOTE — Telephone Encounter (Signed)
Patient called for refill on Vitamin D. Reviewed with PA-C, chart checked and refilled. Patient made aware.

## 2016-05-11 ENCOUNTER — Encounter (HOSPITAL_COMMUNITY): Payer: Self-pay

## 2016-05-11 ENCOUNTER — Encounter (HOSPITAL_COMMUNITY): Payer: Medicare Other | Attending: Oncology

## 2016-05-11 ENCOUNTER — Other Ambulatory Visit (HOSPITAL_COMMUNITY): Payer: Self-pay | Admitting: Oncology

## 2016-05-11 ENCOUNTER — Encounter (HOSPITAL_COMMUNITY): Payer: Medicare Other | Attending: Oncology | Admitting: Oncology

## 2016-05-11 VITALS — BP 159/71 | HR 51 | Temp 98.0°F | Resp 18 | Wt 250.4 lb

## 2016-05-11 DIAGNOSIS — R300 Dysuria: Secondary | ICD-10-CM

## 2016-05-11 DIAGNOSIS — Z170 Estrogen receptor positive status [ER+]: Secondary | ICD-10-CM

## 2016-05-11 DIAGNOSIS — E559 Vitamin D deficiency, unspecified: Secondary | ICD-10-CM

## 2016-05-11 DIAGNOSIS — N951 Menopausal and female climacteric states: Secondary | ICD-10-CM | POA: Diagnosis not present

## 2016-05-11 DIAGNOSIS — C50412 Malignant neoplasm of upper-outer quadrant of left female breast: Secondary | ICD-10-CM | POA: Diagnosis not present

## 2016-05-11 LAB — COMPREHENSIVE METABOLIC PANEL
ALBUMIN: 4.1 g/dL (ref 3.5–5.0)
ALK PHOS: 52 U/L (ref 38–126)
ALT: 13 U/L — ABNORMAL LOW (ref 14–54)
ANION GAP: 8 (ref 5–15)
AST: 15 U/L (ref 15–41)
BILIRUBIN TOTAL: 0.4 mg/dL (ref 0.3–1.2)
BUN: 14 mg/dL (ref 6–20)
CALCIUM: 9.5 mg/dL (ref 8.9–10.3)
CO2: 27 mmol/L (ref 22–32)
Chloride: 105 mmol/L (ref 101–111)
Creatinine, Ser: 0.76 mg/dL (ref 0.44–1.00)
Glucose, Bld: 96 mg/dL (ref 65–99)
POTASSIUM: 4.1 mmol/L (ref 3.5–5.1)
Sodium: 140 mmol/L (ref 135–145)
TOTAL PROTEIN: 6.8 g/dL (ref 6.5–8.1)

## 2016-05-11 LAB — CBC WITH DIFFERENTIAL/PLATELET
BASOS PCT: 0 %
Basophils Absolute: 0 10*3/uL (ref 0.0–0.1)
Eosinophils Absolute: 0.4 10*3/uL (ref 0.0–0.7)
Eosinophils Relative: 6 %
HCT: 38.1 % (ref 36.0–46.0)
HEMOGLOBIN: 12.1 g/dL (ref 12.0–15.0)
LYMPHS ABS: 2.2 10*3/uL (ref 0.7–4.0)
Lymphocytes Relative: 30 %
MCH: 25.8 pg — AB (ref 26.0–34.0)
MCHC: 31.8 g/dL (ref 30.0–36.0)
MCV: 81.2 fL (ref 78.0–100.0)
MONO ABS: 0.5 10*3/uL (ref 0.1–1.0)
MONOS PCT: 7 %
NEUTROS ABS: 4.2 10*3/uL (ref 1.7–7.7)
Neutrophils Relative %: 57 %
Platelets: 261 10*3/uL (ref 150–400)
RBC: 4.69 MIL/uL (ref 3.87–5.11)
RDW: 15.6 % — AB (ref 11.5–15.5)
WBC: 7.3 10*3/uL (ref 4.0–10.5)

## 2016-05-11 NOTE — Progress Notes (Signed)
[place]  Progress Note  Patient Care Team: [name], MD as PCP - General (Family Medicine)  CHIEF COMPLAINTS/PURPOSE OF CONSULTATION:    Breast cancer, left breast ([place])   05/05/2015 Procedure    Left needle core biopsy      05/05/2015 Pathology Results    Breast, left, needle core biopsy, 2:00 o'clock - INVASIVE AND IN SITU MAMMARY CARCINOMA WITH CALCIFICATIONS.      06/03/2015 Procedure    Left breast biopsy      06/03/2015 Pathology Results    Breast, left, needle core biopsy, central - ATYPICAL DUCTAL HYPERPLASIA.      06/17/2015 Pathology Results    Breast, partial mastectomy, Left, upper outer quadrant with seed and needle loc - MULTIFOCAL INVASIVE GRADE II LOBULAR CARCINOMA, TWO FOCI MEASURING 1.8 CM AND 1.7 CM IN GREATEST DIMENSION.      06/17/2015 Procedure    Left partial mastectomy by Dr. Judeth Horn      06/17/2015 Initial Diagnosis    Breast cancer ([place])      07/10/2015 Procedure    Left breast re-excision by Dr. Marla Roe      07/10/2015 Pathology Results    Breast, excision, Left Re-excision - BENIGN BREAST TISSUE WITH BIOPSY CHANGES. - NO RESIDUAL TUMOR. - FINAL MARGINS CLEAR.Breast, left, needle core biopsy, outer - INVASIVE LOBULAR CARCINOMA.      08/01/2015 - 08/04/2015 Hospital Admission    Status post bilateral breast reduction  Status post partial mastectomy of left breast  Open wound of right breast  Open breast wound      08/11/2015 Miscellaneous    Mammaprint LOW RISK: No Potential Significant Chemotherapy Benefit      01/05/2016 Imaging    Bone density- BMD as determined from [facility] L1-L2 is 1.063 g/cm2 with a T-Score of -0.9. This patient is considered normal according to [facility]) criteria.      Stage IIA (T1C(2)N1AM0) breast cancer, T1 tumor, 1 node greater than 49m of mets; multifocal, ER+, PR +/-, HER2-, lobular carcinoma.  HISTORY OF PRESENTING ILLNESS:  Colleen Lecompte89 y.o. female is here because of Stage IIA (T1c(m)N1aM0) breast cancer, T1 tumor, 1 lymph node involved with metastatic disease with extracapsular extension; multifocal, ER+, PR +/-, HER2-, lobular carcinoma.  Mammaprint was low risk. She completed XRT. She continues to have problems with a nonhealing wound to the R breast after reduction.   Colleen Cohen today accompanied by her husband for continuing follow up.   She states she has been tolerating her Armidex well. She notes having hot flashes during the night. She turns the fan on for relief. She feels fatigued.  She has not had a mammogram since last March so she is due for one. She states she does not perform routine breast exams at home. She states she is not currently taking a calcium + Vitamin D supplement.   She has dysuria. She states she has had this all her life and it happens intermittently.   Denies abdominal pain. Denies any fevers/chills.   MEDICAL HISTORY:  Past Medical History:  Diagnosis Date  . Anxiety   . Arthritis   . Breast cancer ([facility] oncologist-  sh[facility] ([facility] and Dr KSondra Come   dx 04/ 2017-- left breast lobular multifocal carcinoma x2 sites-- Stage 2 w/ mets x1 positive node--  ER/PR+,  HER2 negative --  s/p  left breast lumpectomy with radioactive seed implants and re-excision for positive margin  06-21-2015  . Depression   . GERD (gastroesophageal reflux disease)   . History of kidney stones   . Hypertension   . Hypothyroidism, postsurgical   . Migraine   . Myopia of both eyes   . Nephrolithiasis   . PONV (postoperative nausea and vomiting)   . Wears dentures   . Wears glasses   . Wound of right breast     SURGICAL HISTORY: Past Surgical History:  Procedure Laterality Date  . APPLICATION OF A-CELL OF CHEST/ABDOMEN Right 10/30/2015   Procedure: APPLICATION OF A-CELL RIGHT BREAST WOUND;  Surgeon: Loel Lofty Dillingham, DO;  Location: [place];  Service: Plastics;  Laterality: Right;  .  APPLICATION OF WOUND VAC Right 08/14/2015   Procedure: APPLICATION OF WOUND VAC;  Surgeon: Loel Lofty Dillingham, DO;  Location: [place];  Service: Plastics;  Laterality: Right;  . BREAST LUMPECTOMY WITH RADIOACTIVE SEED AND SENTINEL LYMPH NODE BIOPSY Left 06/17/2015   Procedure: LEFT BREAST LUMPECTOMY WITH RADIOACTIVE SEED AND SENTINEL LYMPH NODE BIOPSY, NEEDLE LOCALIZATION IN ATYPICAL DUCTAL HYPERPLASIA  AREA OF LEFT BREAST;  Surgeon: Judeth Horn, MD;  Location: [place];  Service: General;  Laterality: Left;  . BREAST RECONSTRUCTION Left 07/10/2015   Procedure: REVISION OF LEFT BREAST REDUCTION;  Surgeon: Loel Lofty Dillingham, DO;  Location: [place];  Service: Plastics;  Laterality: Left;  . BREAST REDUCTION SURGERY Bilateral 06/17/2015   Procedure: BILATERAL BREAST  REDUCTION  WITH RECONSTRUCTION  ON LEFT  AFTER PARTIAL MASTECTOMY;  Surgeon: Wallace Going, DO;  Location: [place];  Service: Plastics;  Laterality: Bilateral;  . CARPAL TUNNEL RELEASE Right x2  last one 2008  . CHOLECYSTECTOMY  2007  . CYSTO/  URETEROSCOPIC LASER LITHOTRIPSY / STONE EXTRACTION'S/  STENT PLACEMENT  x3  last one 2014  . INCISION AND DRAINAGE OF WOUND Right 08/14/2015   Procedure: Excision of right breast WOUND;  Surgeon: Loel Lofty Dillingham, DO;  Location: [place];  Service: Plastics;  Laterality: Right;  . INCISION AND DRAINAGE OF WOUND Right 09/22/2015   Procedure: IRRIGATION AND DEBRIDEMENT WOUND;  Surgeon: Wallace Going, DO;  Location: [place];  Service: Plastics;  Laterality: Right;  . INCISION AND DRAINAGE OF WOUND Right 10/30/2015   Procedure: IRRIGATION AND DEBRIDEMENT RIGHT BREAST WOUND;  Surgeon: Loel Lofty Dillingham, DO;  Location: [place];  Service: Plastics;  Laterality: Right;  . LUMBAR FUSION  2008   L1 -- L2  . MASS EXCISION Left 09/22/2015   Procedure: EXCISION LEFT BREAST MASS;  Surgeon: Wallace Going, DO;   Location: [place];  Service: Plastics;  Laterality: Left;  . NEGATIVE SLEEP STUDY  2007  (approx)  per pt  . PARTIAL NEPHRECTOMY Left 1972   "part of kidney rotted"  . RE-EXCISION OF BREAST LUMPECTOMY Left 07/10/2015   Procedure: RE-EXCISION OF LEFT BREAST LUMPECTOMY ;  Surgeon: Judeth Horn, MD;  Location: [place];  Service: General;  Laterality: Left;  . RETINAL DETACHMENT SURGERY Right 2015  [facility]   and Repair macular hole  . SHOULDER ARTHROSCOPY Right 2013  . THYROIDECTOMY Bilateral 2012    SOCIAL HISTORY: Social History   Social History  . Marital status: Married    Spouse name: N/A  . Number of children: N/A  . Years of education: N/A   Occupational History  . Not on file.   Social History Main Topics  . Smoking status: Never Smoker  . Smokeless tobacco: Never Used  .  Alcohol use Yes     Comment: rare  . Drug use: No  . Sexual activity: Not on file   Other Topics Concern  . Not on file   Social History Narrative  . No narrative on file   Married 16 years. 9 children. Her husband has 2 children, 1 grandchild. 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 Born in [place].  Hobbies include bowling, travelling, shopping. Never smoker. No alcohol problems. Has cats; 2 female Persians.  FAMILY HISTORY: Family History  Problem Relation Age of Onset  . Renal cancer Mother   . Breast cancer Sister     Mother and father both passed away at age 79. Mother had kidney cancer and Alzheimer's Father had emphysema; was a heavy smoker. 1 sister who was a Marine scientist; had breast cancer at age 75. She had radiation treatment, and took the pills for 5 years.  ALLERGIES:  is allergic to bactrim [sulfamethoxazole-trimethoprim]; vancomycin; and codeine.  MEDICATIONS:  Current Outpatient Prescriptions  Medication Sig Dispense Refill  . anastrozole (ARIMIDEX) 1 MG tablet Take 1 mg by mouth.    Marland Kitchen aspirin 81 MG chewable tablet Chew 81 mg by  mouth daily.    . diazepam (VALIUM) 10 MG tablet Take 10 mg by mouth 2 (two) times daily.  1  . ergocalciferol (VITAMIN D2) 50000 units capsule Take 1 capsule (50,000 Units total) by mouth once a week. 12 capsule 0  . FLUoxetine (PROZAC) 20 MG capsule Take 20 mg by mouth every morning.  3  . hydrochlorothiazide (HYDRODIURIL) 25 MG tablet Take 25 mg by mouth every morning.     Marland Kitchen levothyroxine (SYNTHROID, LEVOTHROID) 112 MCG tablet Take by mouth.    . metoprolol succinate (TOPROL-XL) 50 MG 24 hr tablet Take 50 mg by mouth every morning. Take with or immediately following a meal.    . oxyCODONE (OXY IR/ROXICODONE) 5 MG immediate release tablet Take 1 tablet (5 mg total) by mouth every 4 (four) hours as needed. (Patient taking differently: Take 5 mg by mouth every 4 (four) hours as needed for moderate pain. ) 60 tablet 0  . pantoprazole (PROTONIX) 40 MG tablet Take 40 mg by mouth every morning.    . phenazopyridine (PYRIDIUM) 200 MG tablet Take 200 mg by mouth every 8 (eight) hours as needed.  1  . topiramate (TOPAMAX) 100 MG tablet Take 100 mg by mouth at bedtime.  1   No current facility-administered medications for this visit.     Review of Systems  Constitutional: Positive for malaise/fatigue. Negative for fever.       Hot flashes at night.  HENT: Negative.   Eyes: Negative.   Respiratory: Negative.   Cardiovascular: Negative.   Gastrointestinal: Negative.  Negative for abdominal pain.  Genitourinary: Positive for dysuria (chronic, occurs intermittently).  Musculoskeletal: Negative.   Skin: Negative.   Neurological: Negative.   Endo/Heme/Allergies: Negative.   Psychiatric/Behavioral: Negative.   All other systems reviewed and are negative. 14 point ROS was done and is otherwise as detailed above or in HPI  PHYSICAL EXAMINATION: ECOG PERFORMANCE STATUS: 1 - Symptomatic but completely ambulatory  Vitals:   05/11/16 1345  BP: (!) 159/71  Pulse: (!) 51  Resp: 18  Temp: 98 F (36.7  C)   Filed Weights   05/11/16 1345  Weight: 250 lb 6.4 oz (113.6 kg)      Physical Exam  Constitutional: She is oriented to person, place, and time and well-developed, well-nourished, and  in no distress.  HENT:  Head: Normocephalic and atraumatic.  Nose: Nose normal.  Mouth/Throat: Oropharynx is clear and moist. No oropharyngeal exudate.  Eyes: Conjunctivae and EOM are normal. Pupils are equal, round, and reactive to light. Right eye exhibits no discharge. Left eye exhibits no discharge. No scleral icterus.  Neck: Normal range of motion. Neck supple. No tracheal deviation present. No thyromegaly present.  Cardiovascular: Normal rate, regular rhythm, normal heart sounds and intact distal pulses.  Exam reveals no gallop and no friction rub.   No murmur heard. Pulmonary/Chest: Effort normal and breath sounds normal. She has no wheezes. She has no rales.    Abdominal: Soft. Bowel sounds are normal. She exhibits no distension and no mass. There is no tenderness. There is no rebound and no guarding.  Musculoskeletal: Normal range of motion. She exhibits no edema or tenderness.  Lymphadenopathy:    She has no cervical adenopathy.  Neurological: She is alert and oriented to person, place, and time. She has normal reflexes. No cranial nerve deficit. Gait normal. Coordination normal.  Skin: Skin is warm and dry. No rash noted.  Psychiatric: Mood, memory, affect and judgment normal.  Nursing note and vitals reviewed.   LABORATORY DATA:  I have reviewed the data as listed Results for Colleen, Cohen (MRN [id number]) as of 02/02/2016 14:16  Ref. Range 02/02/2016 12:32  COMPREHENSIVE METABOLIC PANEL Unknown Rpt (A)  Sodium Latest Ref Range: 135 - 145 mmol/L 139  Potassium Latest Ref Range: 3.5 - 5.1 mmol/L 3.8  Chloride Latest Ref Range: 101 - 111 mmol/L 103  CO2 Latest Ref Range: 22 - 32 mmol/L 29  Glucose Latest Ref Range: 65 - 99 mg/dL 108 (H)  BUN Latest Ref Range: 6 - 20 mg/dL 19    Creatinine Latest Ref Range: 0.44 - 1.00 mg/dL 0.85  Calcium Latest Ref Range: 8.9 - 10.3 mg/dL 9.1  Anion gap Latest Ref Range: 5 - 15  7  Alkaline Phosphatase Latest Ref Range: 38 - 126 U/L 47  Albumin Latest Ref Range: 3.5 - 5.0 g/dL 4.0  AST Latest Ref Range: 15 - 41 U/L 14 (L)  ALT Latest Ref Range: 14 - 54 U/L 16  Total Protein Latest Ref Range: 6.5 - 8.1 g/dL 7.0  Total Bilirubin Latest Ref Range: 0.3 - 1.2 mg/dL 0.5  EGFR (African American) Latest Ref Range: >60 mL/min >60  EGFR (Non-African Amer.) Latest Ref Range: >60 mL/min >60  WBC Latest Ref Range: 4.0 - 10.5 K/uL 6.2  RBC Latest Ref Range: 3.87 - 5.11 MIL/uL 4.75  Hemoglobin Latest Ref Range: 12.0 - 15.0 g/dL 12.7  HCT Latest Ref Range: 36.0 - 46.0 % 40.1  MCV Latest Ref Range: 78.0 - 100.0 fL 84.4  MCH Latest Ref Range: 26.0 - 34.0 pg 26.7  MCHC Latest Ref Range: 30.0 - 36.0 g/dL 31.7  RDW Latest Ref Range: 11.5 - 15.5 % 14.4  Platelets Latest Ref Range: 150 - 400 K/uL 226  Neutrophils Latest Units: % 61  Lymphocytes Latest Units: % 26  Monocytes Relative Latest Units: % 7  Eosinophil Latest Units: % 6  Basophil Latest Units: % 0  NEUT# Latest Ref Range: 1.7 - 7.7 K/uL 3.8  Lymphocyte # Latest Ref Range: 0.7 - 4.0 K/uL 1.6  Monocyte # Latest Ref Range: 0.1 - 1.0 K/uL 0.4  Eosinophils Absolute Latest Ref Range: 0.0 - 0.7 K/uL 0.4  Basophils Absolute Latest Ref Range: 0.0 - 0.1 K/uL 0.0    PATHOLOGY   RADIOGRAPHIC  STUDIES: I have personally reviewed the radiological images as listed and agreed with the findings in the report. Study Result   EXAM: DUAL X-RAY ABSORPTIOMETRY (DXA) FOR BONE MINERAL DENSITY  IMPRESSION: Ordering Physician:  Dr. Patrici Ranks,  Your patient Colleen Cohen completed a BMD test on 01/05/2016 using the [place] (software version: 14.10) manufactured by [employer]. The following summarizes the results of our evaluation. PATIENT  BIOGRAPHICAL: Name: ZULLY, FRANE Patient ID: [id number] Birth Date: July 01, 1950 Height: 67.0 in. Gender: Female Exam Date: 01/05/2016 Weight: 257.0 lbs. Indications: Back surgery, Breast Ca, Caucasian, Height Loss, Low Calcium Intake, Post Menopausal Fractures: Treatments: Anastrozole, Multivitamin DENSITOMETRY RESULTS: Site      Region    Measured Date Measured Age WHO Classification Young Adult T-score BMD         %Change vs. Previous Significant Change (*) [facility] Spine L1-L2 01/05/2016 65.3 Normal -0.9 1.063 g/cm2  DualFemur Neck Left 01/05/2016 65.3 Normal -0.7 0.947 g/cm2 ASSESSMENT: BMD as determined from [facility] L1-L2 is 1.063 g/cm2 with a T-Score of -0.9. This patient is considered normal according to [facility]) criteria. Per official position of the ISCD, it is not possible to quantitatively compare BMD or calculate a LSC between different facilities or devices. (L-3-4 was excluded due to history of surgical repair.) (Patient is not a candidate for FRAX assessment due to normal bone density exam.)  World Health Organization [facility]) criteria for post-menopausal, Caucasian Women: Normal:       T-score at or above -1 SD Osteopenia:   T-score between -1 and -2.5 SD Osteoporosis: T-score at or below -2.5 SD  RECOMMENDATIONS: [facility] recommends that FDA-approved medial therapies be considered in postmenopausal women and men age 28 or older with a: 1. Hip or vertebral (clinical or morphometric) fracture. 2. T-Score of < -2.5 at the spine or hip. 3. Ten-year fracture probability by FRAX of 3% or greater for hip fracture or 20% or greater for major osteoporotic fracture.  All treatment decisions require clinical judgment and consideration of individual patient factors, including patient preferences, co-morbidities, previous drug use, risk factors not captured in the FRAX model (e.g. falls, vitamin D deficiency, increased  bone turnover, interval significant decline in bone density) and possible under-or over-estimation of fracture risk by FRAX.  All patients should ensure an adequate intake of dietary calcium (1200 mg/d) and vitamin D (800 IU daily) unless contraindicated.  FOLLOW-UP: People with diagnosed cases of osteoporosis or osteopenia should be regularly tested for bone mineral density. For patients eligible for Medicare, routine testing is allowed once every 2 years. Testing frequency can be increased for patients who have rapidly progressing disease, or for those who are receiving medical therapy to restore bone mass.  I have reviewed this report, and agree with the above findings.  [facility]   Electronically Signed   By: Lahoma Crocker M.D.   On: 01/05/2016 12:48    ASSESSMENT & PLAN:  Stage IIA (T1C(2)N1AM0) breast cancer, multifocal, ER+, PR +/-, HER2-, lobular carcinoma. Left upper outer quadrant B/L Breast reduction Post surgical complications Mammaprint -- Low Risk Arimidex Situational depression Weight gain  Clinically NED on exam today. Breast exam was performed today.  I ordered a bilateral diagnostic mammogram for the patient as she is due for one.  Repeat bone density test in December 2019.   I told her to start taking a calcium + Vitamin D supplement for her bone health while on an AI, as  she is not currently taking one.   RTC in 3 months for follow up.   All questions were answered. The patient knows to call the clinic with any problems, questions or concerns.  This document serves as a record of services personally performed by Twana First, MD. It was created on her behalf by Shirlean Mylar, a trained medical scribe. The creation of this record is based on the scribe's personal observations and the provider's statements to them. This document has been checked and approved by the attending provider.  I have reviewed the above documentation for  accuracy and completeness and I agree with the above.  This note was electronically signed.    [place]

## 2016-05-11 NOTE — Patient Instructions (Signed)
[place] at [facility] Discharge Instructions  RECOMMENDATIONS MADE BY THE CONSULTANT AND ANY TEST RESULTS WILL BE SENT TO YOUR REFERRING PHYSICIAN.  You were seen today by Dr. Twana First Follow up in 3 months  We will schedule you for your mammogram See Amy up front for appointments   Thank you for choosing [place] at [facility] to provide your oncology and hematology care.  To afford each patient quality time with our provider, please arrive at least 15 minutes before your scheduled appointment time.    If you have a lab appointment with the [facility] please come in thru the  Main Entrance and check in at the main information desk  You need to re-schedule your appointment should you arrive 10 or more minutes late.  We strive to give you quality time with our providers, and arriving late affects you and other patients whose appointments are after yours.  Also, if you no show three or more times for appointments you may be dismissed from the clinic at the providers discretion.     Again, thank you for choosing [facility].  Our hope is that these requests will decrease the amount of time that you wait before being seen by our physicians.       _____________________________________________________________  Should you have questions after your visit to [facility], please contact our office at [phone] between the hours of 8:30 a.m. and 4:30 p.m.  Voicemails left after 4:30 p.m. will not be returned until the following business day.  For prescription refill requests, have your pharmacy contact our office.       Resources For Cancer Patients and their Caregivers ? American Cancer Society: Can assist with transportation, wigs, general needs, runs Look Good Feel Better.        7780685542 ? Cancer Care: Provides financial assistance, online support groups, medication/co-pay assistance.   1-800-813-HOPE 3348001512) ? [facility] Assists [place] cancer patients and their families through emotional , educational and financial support.  720-368-6148 ? [place] DSS Where to apply for food stamps, Medicaid and utility assistance. 671-391-9695 ? [facility]: Transportation to medical appointments. 830-794-6998 ? Social Security Administration: May apply for disability if have a Stage IV cancer. 732-789-1130 314-601-9955 ? [employer], Disability and Transit Services: Assists with nutrition, care and transit needs. [place] Support Programs: @10RELATIVEDAYS @ > Cancer Support Group  2nd Tuesday of the month 1pm-2pm, Journey Room  > Creative Journey  3rd Tuesday of the month 1130am-1pm, Journey Room  > Look Good Feel Better  1st Wednesday of the month 10am-12 noon, Journey Room (Call [facility] to register [phone])

## 2016-05-11 NOTE — Progress Notes (Signed)
Patient is taking Arimidex as scheduled. Reports not missing any doses.  Patient denies any side effects from the medication.

## 2016-05-12 LAB — VITAMIN D 25 HYDROXY (VIT D DEFICIENCY, FRACTURES): Vit D, 25-Hydroxy: 17.9 ng/mL — ABNORMAL LOW (ref 30.0–100.0)

## 2016-06-01 ENCOUNTER — Encounter (HOSPITAL_COMMUNITY): Payer: Self-pay

## 2016-06-01 ENCOUNTER — Ambulatory Visit (HOSPITAL_COMMUNITY)
Admission: RE | Admit: 2016-06-01 | Discharge: 2016-06-01 | Disposition: A | Discharge status: Home / Self Care | Payer: Medicare Other | Source: Ambulatory Visit | Attending: Oncology | Admitting: Oncology

## 2016-06-01 DIAGNOSIS — Z9889 Other specified postprocedural states: Secondary | ICD-10-CM | POA: Insufficient documentation

## 2016-06-01 DIAGNOSIS — C50412 Malignant neoplasm of upper-outer quadrant of left female breast: Secondary | ICD-10-CM | POA: Insufficient documentation

## 2016-06-01 DIAGNOSIS — Z170 Estrogen receptor positive status [ER+]: Secondary | ICD-10-CM | POA: Insufficient documentation

## 2016-07-26 ENCOUNTER — Other Ambulatory Visit (HOSPITAL_COMMUNITY): Payer: Self-pay | Admitting: Oncology

## 2016-07-26 DIAGNOSIS — E559 Vitamin D deficiency, unspecified: Secondary | ICD-10-CM

## 2016-08-09 ENCOUNTER — Other Ambulatory Visit (HOSPITAL_COMMUNITY): Payer: Self-pay | Admitting: *Deleted

## 2016-08-09 DIAGNOSIS — C50412 Malignant neoplasm of upper-outer quadrant of left female breast: Secondary | ICD-10-CM

## 2016-08-10 ENCOUNTER — Encounter (HOSPITAL_COMMUNITY): Payer: Medicare Other | Attending: Adult Health | Admitting: Adult Health

## 2016-08-10 ENCOUNTER — Encounter (HOSPITAL_COMMUNITY): Payer: Self-pay | Admitting: Adult Health

## 2016-08-10 ENCOUNTER — Encounter (HOSPITAL_COMMUNITY): Payer: Medicare Other

## 2016-08-10 VITALS — BP 122/77 | HR 61 | Resp 16 | Ht 67.0 in | Wt 252.0 lb

## 2016-08-10 DIAGNOSIS — Z882 Allergy status to sulfonamides status: Secondary | ICD-10-CM | POA: Diagnosis not present

## 2016-08-10 DIAGNOSIS — Z79811 Long term (current) use of aromatase inhibitors: Secondary | ICD-10-CM | POA: Diagnosis not present

## 2016-08-10 DIAGNOSIS — Z170 Estrogen receptor positive status [ER+]: Secondary | ICD-10-CM | POA: Diagnosis not present

## 2016-08-10 DIAGNOSIS — E039 Hypothyroidism, unspecified: Secondary | ICD-10-CM | POA: Insufficient documentation

## 2016-08-10 DIAGNOSIS — Z87442 Personal history of urinary calculi: Secondary | ICD-10-CM | POA: Diagnosis not present

## 2016-08-10 DIAGNOSIS — Z905 Acquired absence of kidney: Secondary | ICD-10-CM | POA: Insufficient documentation

## 2016-08-10 DIAGNOSIS — L98499 Non-pressure chronic ulcer of skin of other sites with unspecified severity: Secondary | ICD-10-CM | POA: Diagnosis not present

## 2016-08-10 DIAGNOSIS — R42 Dizziness and giddiness: Secondary | ICD-10-CM

## 2016-08-10 DIAGNOSIS — Z881 Allergy status to other antibiotic agents status: Secondary | ICD-10-CM | POA: Diagnosis not present

## 2016-08-10 DIAGNOSIS — F329 Major depressive disorder, single episode, unspecified: Secondary | ICD-10-CM | POA: Diagnosis not present

## 2016-08-10 DIAGNOSIS — F419 Anxiety disorder, unspecified: Secondary | ICD-10-CM | POA: Insufficient documentation

## 2016-08-10 DIAGNOSIS — C50412 Malignant neoplasm of upper-outer quadrant of left female breast: Secondary | ICD-10-CM | POA: Insufficient documentation

## 2016-08-10 DIAGNOSIS — Z9049 Acquired absence of other specified parts of digestive tract: Secondary | ICD-10-CM | POA: Insufficient documentation

## 2016-08-10 DIAGNOSIS — Z7982 Long term (current) use of aspirin: Secondary | ICD-10-CM | POA: Diagnosis not present

## 2016-08-10 DIAGNOSIS — Z79899 Other long term (current) drug therapy: Secondary | ICD-10-CM | POA: Diagnosis not present

## 2016-08-10 DIAGNOSIS — I10 Essential (primary) hypertension: Secondary | ICD-10-CM | POA: Insufficient documentation

## 2016-08-10 DIAGNOSIS — K219 Gastro-esophageal reflux disease without esophagitis: Secondary | ICD-10-CM | POA: Insufficient documentation

## 2016-08-10 LAB — CBC WITH DIFFERENTIAL/PLATELET
Basophils Absolute: 0 10*3/uL (ref 0.0–0.1)
Basophils Relative: 0 %
EOS ABS: 0.3 10*3/uL (ref 0.0–0.7)
EOS PCT: 4 %
HCT: 38.3 % (ref 36.0–46.0)
Hemoglobin: 11.9 g/dL — ABNORMAL LOW (ref 12.0–15.0)
LYMPHS ABS: 1.9 10*3/uL (ref 0.7–4.0)
Lymphocytes Relative: 28 %
MCH: 25.3 pg — ABNORMAL LOW (ref 26.0–34.0)
MCHC: 31.1 g/dL (ref 30.0–36.0)
MCV: 81.3 fL (ref 78.0–100.0)
MONOS PCT: 5 %
Monocytes Absolute: 0.4 10*3/uL (ref 0.1–1.0)
Neutro Abs: 4.3 10*3/uL (ref 1.7–7.7)
Neutrophils Relative %: 63 %
PLATELETS: 208 10*3/uL (ref 150–400)
RBC: 4.71 MIL/uL (ref 3.87–5.11)
RDW: 15.3 % (ref 11.5–15.5)
WBC: 6.9 10*3/uL (ref 4.0–10.5)

## 2016-08-10 LAB — COMPREHENSIVE METABOLIC PANEL
ALT: 13 U/L — ABNORMAL LOW (ref 14–54)
ANION GAP: 11 (ref 5–15)
AST: 19 U/L (ref 15–41)
Albumin: 4.2 g/dL (ref 3.5–5.0)
Alkaline Phosphatase: 59 U/L (ref 38–126)
BUN: 12 mg/dL (ref 6–20)
CALCIUM: 9.5 mg/dL (ref 8.9–10.3)
CHLORIDE: 104 mmol/L (ref 101–111)
CO2: 26 mmol/L (ref 22–32)
Creatinine, Ser: 0.91 mg/dL (ref 0.44–1.00)
GFR calc non Af Amer: >60 mL/min (ref >60)
Glucose, Bld: 120 mg/dL — ABNORMAL HIGH (ref 65–99)
Potassium: 3.5 mmol/L (ref 3.5–5.1)
SODIUM: 141 mmol/L (ref 135–145)
Total Bilirubin: 0.4 mg/dL (ref 0.3–1.2)
Total Protein: 7 g/dL (ref 6.5–8.1)

## 2016-08-10 NOTE — Pre-Procedure Instructions (Signed)
(  L) intramammary fold wound

## 2016-08-10 NOTE — Progress Notes (Signed)
[place], [place] [address]   CLINIC:  Medical Oncology/Hematology  PCP:  [name], MD [facility] and [place] [address] [phone]   REASON FOR VISIT:  Follow-up for Stage IIA invasive lobular carcinoma, ER+/PR+/HER2-  CURRENT THERAPY: Anastrozole daily, beginning 12/2015   BRIEF ONCOLOGIC HISTORY:    Breast cancer, left breast ([place])   05/05/2015 Procedure    Left needle core biopsy      05/05/2015 Pathology Results    Breast, left, needle core biopsy, 2:00 o'clock - INVASIVE AND IN SITU MAMMARY CARCINOMA WITH CALCIFICATIONS.      06/03/2015 Procedure    Left breast biopsy      06/03/2015 Pathology Results    Breast, left, needle core biopsy, central - ATYPICAL DUCTAL HYPERPLASIA.      06/17/2015 Pathology Results    Breast, partial mastectomy, Left, upper outer quadrant with seed and needle loc - MULTIFOCAL INVASIVE GRADE II LOBULAR CARCINOMA, TWO FOCI MEASURING 1.8 CM AND 1.7 CM IN GREATEST DIMENSION.      06/17/2015 Procedure    Left partial mastectomy by Dr. Judeth Horn      06/17/2015 Initial Diagnosis    Breast cancer ([place])      07/10/2015 Procedure    Left breast re-excision by Dr. Marla Roe      07/10/2015 Pathology Results    Breast, excision, Left Re-excision - BENIGN BREAST TISSUE WITH BIOPSY CHANGES. - NO RESIDUAL TUMOR. - FINAL MARGINS CLEAR.Breast, left, needle core biopsy, outer - INVASIVE LOBULAR CARCINOMA.      08/01/2015 - 08/04/2015 Hospital Admission    Status post bilateral breast reduction  Status post partial mastectomy of left breast  Open wound of right breast  Open breast wound      08/11/2015 Miscellaneous    Mammaprint LOW RISK: No Potential Significant Chemotherapy Benefit      11/11/2015 - 12/15/2015 Radiation Therapy    Adjuvant radiation therapy; [place]; Left breast and regional LN to 50.4 GY/1.8 Gy          01/05/2016 Imaging      Bone density- BMD as determined from [facility] L1-L2 is 1.063 g/cm2 with a T-Score of -0.9. This patient is considered normal according to [facility]) criteria.      12/2015 -  Anti-estrogen oral therapy    Anastrozole daily         INTERVAL HISTORY:  Colleen Cohen 66 y.o. female returns for follow-up for history of Stage IIA left breast cancer.   Overall, she tells me she has been feeling pretty well. Appetite is 100%; energy levels 50%.  Fatigue is improving. Remains on Arimidex with good tolerance; she does have periodic hot flashes, particularly in the evening; they are manageable.  Denies vaginal dryness.  She has chronic arthralgias, particularly to her knees and shoulders, but they are no worse.  Joint pains are managed with injections by outside provider.  She remains on vitamin D 50,000 IU weekly, managed by PCP.  She eats foods rich in calcium.  She does not exercise regularly.  She continues to have dizziness, particularly with frequent position changes (going from seated to standing position). She is on anti-hypertensive medications and sees her PCP regularly. Does admit that she probably does not drink enough water during the day.   With regards to her breasts, reports a new scabbed area to her (L) breast. States that this area has been painful and burning;  she noted the skin wound about 1 month ago. She applied OTC Neosporin, but it has not healed. She is concerned and wants me to look at it today.  Denies any masses or other concerns to her breasts.  Last mammogram was done 06/01/16 and was negative.    She was recently able to enjoy a gambling trip to the [place] with her husband. 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      REVIEW OF SYSTEMS:  Review of Systems  Constitutional: Positive for fatigue. Negative for chills and fever.  HENT:  Negative.  Negative for lump/mass and nosebleeds.   Eyes: Negative.    Respiratory: Negative.  Negative for cough and shortness of breath.   Cardiovascular: Negative.  Negative for chest pain and leg swelling.  Gastrointestinal: Negative.  Negative for abdominal pain, blood in stool, constipation, diarrhea, nausea and vomiting.  Endocrine: Positive for hot flashes.  Genitourinary: Negative.  Negative for dysuria, hematuria and vaginal bleeding.   Musculoskeletal: Positive for arthralgias.  Skin: Positive for wound. Negative for rash.  Neurological: Positive for dizziness. Negative for headaches.  Hematological: Negative.  Negative for adenopathy. Does not bruise/bleed easily.  Psychiatric/Behavioral: Negative.  Negative for depression and sleep disturbance. The patient is not nervous/anxious.      PAST MEDICAL/SURGICAL HISTORY:  Past Medical History:  Diagnosis Date  . Anxiety   . Arthritis   . Breast cancer [facility]) oncologist-  shannon penland ([facility] and Dr Sondra Come    dx 04/ 2017-- left breast lobular multifocal carcinoma x2 sites-- Stage 2 w/ mets x1 positive node--  ER/PR+,  HER2 negative --  s/p  left breast lumpectomy with radioactive seed implants and re-excision for positive margin 06-21-2015  . Depression   . GERD (gastroesophageal reflux disease)   . History of kidney stones   . Hypertension   . Hypothyroidism, postsurgical   . Migraine   . Myopia of both eyes   . Nephrolithiasis   . PONV (postoperative nausea and vomiting)   . Wears dentures   . Wears glasses   . Wound of right breast    Past Surgical History:  Procedure Laterality Date  . APPLICATION OF A-CELL OF CHEST/ABDOMEN Right 10/30/2015   Procedure: APPLICATION OF A-CELL RIGHT BREAST WOUND;  Surgeon: Loel Lofty Dillingham, DO;  Location: [place];  Service: Plastics;  Laterality: Right;  . APPLICATION OF WOUND VAC Right 08/14/2015   Procedure: APPLICATION OF WOUND VAC;  Surgeon: Loel Lofty Dillingham, DO;  Location: [place];  Service: Plastics;  Laterality:  Right;  . BREAST LUMPECTOMY WITH RADIOACTIVE SEED AND SENTINEL LYMPH NODE BIOPSY Left 06/17/2015   Procedure: LEFT BREAST LUMPECTOMY WITH RADIOACTIVE SEED AND SENTINEL LYMPH NODE BIOPSY, NEEDLE LOCALIZATION IN ATYPICAL DUCTAL HYPERPLASIA  AREA OF LEFT BREAST;  Surgeon: Judeth Horn, MD;  Location: [place];  Service: General;  Laterality: Left;  . BREAST RECONSTRUCTION Left 07/10/2015   Procedure: REVISION OF LEFT BREAST REDUCTION;  Surgeon: Loel Lofty Dillingham, DO;  Location: [place];  Service: Plastics;  Laterality: Left;  . BREAST REDUCTION SURGERY Bilateral 06/17/2015   Procedure: BILATERAL BREAST  REDUCTION  WITH RECONSTRUCTION  ON LEFT  AFTER PARTIAL MASTECTOMY;  Surgeon: Wallace Going, DO;  Location: [place];  Service: Plastics;  Laterality: Bilateral;  . CARPAL TUNNEL RELEASE Right x2  last one 2008  . CHOLECYSTECTOMY  2007  . CYSTO/  URETEROSCOPIC LASER LITHOTRIPSY /  STONE EXTRACTION'S/  STENT PLACEMENT  x3  last one 2014  . INCISION AND DRAINAGE OF WOUND Right 08/14/2015   Procedure: Excision of right breast WOUND;  Surgeon: Loel Lofty Dillingham, DO;  Location: [place];  Service: Plastics;  Laterality: Right;  . INCISION AND DRAINAGE OF WOUND Right 09/22/2015   Procedure: IRRIGATION AND DEBRIDEMENT WOUND;  Surgeon: Wallace Going, DO;  Location: [place];  Service: Plastics;  Laterality: Right;  . INCISION AND DRAINAGE OF WOUND Right 10/30/2015   Procedure: IRRIGATION AND DEBRIDEMENT RIGHT BREAST WOUND;  Surgeon: Loel Lofty Dillingham, DO;  Location: [place];  Service: Plastics;  Laterality: Right;  . LUMBAR FUSION  2008   L1 -- L2  . MASS EXCISION Left 09/22/2015   Procedure: EXCISION LEFT BREAST MASS;  Surgeon: Wallace Going, DO;  Location: [place];  Service: Plastics;  Laterality: Left;  . NEGATIVE SLEEP STUDY  2007  (approx)  per pt  . PARTIAL NEPHRECTOMY Left 1972   "part of kidney rotted"    . RE-EXCISION OF BREAST LUMPECTOMY Left 07/10/2015   Procedure: RE-EXCISION OF LEFT BREAST LUMPECTOMY ;  Surgeon: Judeth Horn, MD;  Location: [place];  Service: General;  Laterality: Left;  . REDUCTION MAMMAPLASTY     bilaterally  . RETINAL DETACHMENT SURGERY Right 2015  [facility]   and Repair macular hole  . SHOULDER ARTHROSCOPY Right 2013  . THYROIDECTOMY Bilateral 2012     SOCIAL HISTORY:  Social History   Social History  . Marital status: Married    Spouse name: N/A  . Number of children: N/A  . Years of education: N/A   Occupational History  . Not on file.   Social History Main Topics  . Smoking status: Never Smoker  . Smokeless tobacco: Never Used  . Alcohol use Yes     Comment: rare  . Drug use: No  . Sexual activity: Not on file   Other Topics Concern  . Not on file   Social History Narrative  . No narrative on file    FAMILY HISTORY:  Family History  Problem Relation Age of Onset  . Renal cancer Mother   . Breast cancer Sister     CURRENT MEDICATIONS:  Outpatient Encounter Prescriptions as of 08/10/2016  Medication Sig Note  . anastrozole (ARIMIDEX) 1 MG tablet Take 1 mg by mouth. 02/02/2016: Received from: [place]: Take 1 mg by mouth.  Marland Kitchen aspirin 81 MG chewable tablet Chew 81 mg by mouth daily.   . diazepam (VALIUM) 10 MG tablet Take 10 mg by mouth 2 (two) times daily.   . ergocalciferol (VITAMIN D2) 50000 units capsule Take 1 capsule (50,000 Units total) by mouth once a week.   Marland Kitchen FLUoxetine (PROZAC) 20 MG capsule Take 20 mg by mouth every morning. 12/08/2015: Received from: External Pharmacy Received Sig: TAKE ONE CAPSULE BY MOUTH EVERY MORNING  . levothyroxine (SYNTHROID, LEVOTHROID) 112 MCG tablet Take by mouth.   . metoprolol succinate (TOPROL-XL) 50 MG 24 hr tablet Take 50 mg by mouth every morning. Take with or immediately following a meal. 08/11/2015: .  . oxyCODONE (OXY IR/ROXICODONE) 5 MG immediate release tablet Take 1 tablet (5 mg  total) by mouth every 4 (four) hours as needed. (Patient taking differently: Take 5 mg by mouth every 4 (four) hours as needed for moderate pain. )   . pantoprazole (PROTONIX) 40 MG tablet Take 40 mg by mouth every morning.   Marland Kitchen  phenazopyridine (PYRIDIUM) 200 MG tablet Take 200 mg by mouth every 8 (eight) hours as needed. 02/02/2016: Received from: External Pharmacy Received Sig: TAKE 1 TABLET BY MOUTH EVERY 8 HOURS AS NEEDED  . topiramate (TOPAMAX) 100 MG tablet Take 100 mg by mouth at bedtime.   . [DISCONTINUED] hydrochlorothiazide (HYDRODIURIL) 25 MG tablet Take 25 mg by mouth every morning.     No facility-administered encounter medications on file as of 08/10/2016.     ALLERGIES:  Allergies  Allergen Reactions  . Bactrim [Sulfamethoxazole-Trimethoprim] Other (See Comments)    Lactic acid increase Lactic acid increase  . Vancomycin Hives  . Codeine Rash     PHYSICAL EXAM:  ECOG Performance status: 1 - Symptomatic; remains largely independent   Vitals:   08/10/16 1404 08/10/16 1405  BP: 125/63 122/77  Pulse: (!) 54 61  Resp:     Filed Weights   08/10/16 1320  Weight: 252 lb (114.3 kg)    Physical Exam  Constitutional: She is oriented to person, place, and time and well-developed, well-nourished, and in no distress.  HENT:  Head: Normocephalic.  Mouth/Throat: Oropharynx is clear and moist. No oropharyngeal exudate.  Eyes: Pupils are equal, round, and reactive to light. Conjunctivae are normal. No scleral icterus.  Neck: Normal range of motion. Neck supple.  Cardiovascular: Regular rhythm.   Bradycardia  Pulmonary/Chest: Effort normal and breath sounds normal. No respiratory distress. She has no wheezes. She has no rales.    Abdominal: Soft. Bowel sounds are normal. There is no tenderness.  Musculoskeletal: Normal range of motion. She exhibits no edema.  Lymphadenopathy:    She has no cervical adenopathy.       Right: No supraclavicular adenopathy present.       Left:  No supraclavicular adenopathy present.  Neurological: She is alert and oriented to person, place, and time. No cranial nerve deficit. Gait normal.  Skin: Skin is warm and dry. No rash noted.  Psychiatric: Mood, memory, affect and judgment normal.  Nursing note and vitals reviewed.   (L) breast ulceration: Photo taken after verbal permission from patient and in the presence of Forest Gleason, Therapist, sports.      LABORATORY DATA:  I have reviewed the labs as listed.  CBC    Component Value Date/Time   WBC 6.9 08/10/2016 1241   RBC 4.71 08/10/2016 1241   HGB 11.9 (L) 08/10/2016 1241   HCT 38.3 08/10/2016 1241   PLT 208 08/10/2016 1241   MCV 81.3 08/10/2016 1241   MCH 25.3 (L) 08/10/2016 1241   MCHC 31.1 08/10/2016 1241   RDW 15.3 08/10/2016 1241   LYMPHSABS 1.9 08/10/2016 1241   MONOABS 0.4 08/10/2016 1241   EOSABS 0.3 08/10/2016 1241   BASOSABS 0.0 08/10/2016 1241   CMP Latest Ref Rng & Units 08/10/2016 05/11/2016 02/02/2016  Glucose 65 - 99 mg/dL 120(H) 96 108(H)  BUN 6 - 20 mg/dL _0 Creatinine 0.44 - 1.00 mg/dL 0.91 0.76 0.85  Sodium 135 - 145 mmol/L 141 140 139  Potassium 3.5 - 5.1 mmol/L 3.5 4.1 3.8  Chloride 101 - 111 mmol/L 104 105 103  CO2 22 - 32 mmol/L _1 Calcium 8.9 - 10.3 mg/dL 9.5 9.5 9.1  Total Protein 6.5 - 8.1 g/dL 7.0 6.8 7.0  Total Bilirubin 0.3 - 1.2 mg/dL 0.4 0.4 0.5  Alkaline Phos 38 - 126 U/L 59 52 47  AST 15 - 41 U/L 19 15 14(L)  ALT 14 - 54 U/L 13(L) 13(L)  16    PENDING LABS:    DIAGNOSTIC IMAGING:  *The following radiologic images and reports have been reviewed independently and agree with below findings.  Most recent mammogram: 06/01/16    DEXA scan: 01/05/16        PATHOLOGY:  (L) lumpectomy surgical path: 06/17/15             ASSESSMENT & PLAN:   Stage IIA invasive lobular carcinoma, ER+/PR+/HER2-: -Diagnosed in 04/2015. Treated with lumpectomy, followed by re-excision, then bilateral breast reduction surgery.  Post-op course was complicated by open right breast wounds requiring wound vac. Mammaprint genomic testing revealed low risk results (no benefit from chemotherapy).  She went on to complete adjuvant breast radiation in [place] on 12/15/15. Started anti-estrogen therapy with Anastrozole in 12/2015.  -Clinical breast exam performed today and is benign, which the exception of (L) breast skin ulceration (see below).  -Last mammogram in 05/2016 negative for malignancy; she will be due for repeat diagnostic mammogram in 05/2017; will place orders at future follow-up visits.  -Return to cancer center in 4 months for follow-up with labs.   Bone health:  -Last DEXA scan 01/05/16; normal bone density with T-score -0.9.  -She understands that aromatase inhibitor therapy can weaken bone density over time. Encouraged her to continue vitamin D supplementation as directed by her PCP, as well as increase her calcium consumption. -Repeat DEXA scan imaging will be due in 12/2017; orders to be placed at subsequent follow-up appointments.  Dizziness:  -Orthostatic vital signs checked today. While she did not demonstrate frank orthostasis today, I suspect her antihypertensive medications and lack of adequate oral fluid intake could still be contributing to her positional dizziness.  Encouraged her to increase/push fluid intake of non-caffeinated/non-alcoholic fluids as tolerated.  If no improvement in her symptoms, then could consider MRI brain, although I have low clinical suspicion of brain mets given her positional symptoms.  No headaches or focal neurologic changes.   (L) intramammary fold skin ulceration:  -Present x 1 month without resolution or healing.  Clinically, I have higher suspicion that she had had a friction wound from her bra which is not healing well given history of left breast surgery and adjuvant radiation therapy.  However, given her previous history of non-healing wounds and left breast cancer, I will  reach out to the patient's dermatology team to see if this area may be amenable to skin punch biopsy to rule out a breast cancer skin recurrence.   -Ms. Dabbs is a patient of Dr. Rozann Lesches, a dermatologist here in [place].  I was able to speak with Dr. Allyn Kenner via phone, a partner of Dr. Graciella Freer in the same practice.  We were able to coordinate a visit with Dr. Juel Burrow PA tomorrow (08/11/16) at 1 pm for skin punch biopsy of this lesion. Patient made aware of this appointment and is agreeable to this plan.         Dispo:  -Skin punch biopsy tomorrow, 08/11/16 at 1 pm of (L) breast skin lesion.  -Return to cancer center in 4 months for follow-up with labs.    All questions were answered to patient's stated satisfaction. Encouraged patient to call with any new concerns or questions before her next visit to the cancer center and we can certain see her sooner, if needed.    Plan of care discussed with Dr. Talbert Cage, who agrees with the above aforementioned.    A total of 30 minutes was spent in face-to-face care of this patient,  with greater than 50% of that time spent in counseling and care-coordination.    Orders placed this encounter:  No orders of the defined types were placed in this encounter.     Mike Craze, NP [place] [phone]

## 2016-08-10 NOTE — Patient Instructions (Addendum)
[place] at [facility] Discharge Instructions  RECOMMENDATIONS MADE BY THE CONSULTANT AND ANY TEST RESULTS WILL BE SENT TO YOUR REFERRING PHYSICIAN.  You were seen today by Mike Craze NP. See Dr. Juel Burrow PA tomorrow at 1pm. Force beverages with NO caffeine or alcohol. Return in 4 months for labs and follow up.  Thank you for choosing [place] at [facility] to provide your oncology and hematology care.  To afford each patient quality time with our provider, please arrive at least 15 minutes before your scheduled appointment time.    If you have a lab appointment with the [facility] please come in thru the  Main Entrance and check in at the main information desk  You need to re-schedule your appointment should you arrive 10 or more minutes late.  We strive to give you quality time with our providers, and arriving late affects you and other patients whose appointments are after yours.  Also, if you no show three or more times for appointments you may be dismissed from the clinic at the providers discretion.     Again, thank you for choosing [facility].  Our hope is that these requests will decrease the amount of time that you wait before being seen by our physicians.       _____________________________________________________________  Should you have questions after your visit to [facility], please contact our office at [phone] between the hours of 8:30 a.m. and 4:30 p.m.  Voicemails left after 4:30 p.m. will not be returned until the following business day.  For prescription refill requests, have your pharmacy contact our office.       Resources For Cancer Patients and their Caregivers ? American Cancer Society: Can assist with transportation, wigs, general needs, runs Look Good Feel Better.        (862)462-3142 ? Cancer Care: Provides financial assistance, online support groups,  medication/co-pay assistance.  1-800-813-HOPE 559-143-2031) ? [employer] Assists [place] cancer patients and their families through emotional , educational and financial support.  (979) 653-8895 ? [place] DSS Where to apply for food stamps, Medicaid and utility assistance. 706-306-0806 ? [employer]: Transportation to medical appointments. (925) 569-3314 ? Social Security Administration: May apply for disability if have a Stage IV cancer. 561-873-2051 339-740-0459 ? [employer], Disability and Transit Services: Assists with nutrition, care and transit needs. [place] Support Programs: @10RELATIVEDAYS @ > Cancer Support Group  2nd Tuesday of the month 1pm-2pm, Journey Room  > Creative Journey  3rd Tuesday of the month 1130am-1pm, Journey Room  > Look Good Feel Better  1st Wednesday of the month 10am-12 noon, Journey Room (Call [place] to register [phone])

## 2016-08-12 LAB — VITAMIN D 25 HYDROXY (VIT D DEFICIENCY, FRACTURES): Vit D, 25-Hydroxy: 22.8 ng/mL — ABNORMAL LOW (ref 30.0–100.0)

## 2016-12-08 ENCOUNTER — Other Ambulatory Visit (HOSPITAL_COMMUNITY): Payer: Self-pay | Admitting: *Deleted

## 2016-12-08 DIAGNOSIS — C50519 Malignant neoplasm of lower-outer quadrant of unspecified female breast: Secondary | ICD-10-CM

## 2016-12-08 DIAGNOSIS — C50419 Malignant neoplasm of upper-outer quadrant of unspecified female breast: Secondary | ICD-10-CM

## 2016-12-09 ENCOUNTER — Encounter (HOSPITAL_COMMUNITY): Payer: Medicare Other | Attending: Oncology

## 2016-12-09 DIAGNOSIS — C50419 Malignant neoplasm of upper-outer quadrant of unspecified female breast: Secondary | ICD-10-CM

## 2016-12-09 LAB — COMPREHENSIVE METABOLIC PANEL
ALK PHOS: 89 U/L (ref 38–126)
ALT: 29 U/L (ref 14–54)
AST: 26 U/L (ref 15–41)
Albumin: 4.3 g/dL (ref 3.5–5.0)
Anion gap: 8 (ref 5–15)
BILIRUBIN TOTAL: 0.6 mg/dL (ref 0.3–1.2)
BUN: 17 mg/dL (ref 6–20)
CALCIUM: 9.9 mg/dL (ref 8.9–10.3)
CHLORIDE: 103 mmol/L (ref 101–111)
CO2: 27 mmol/L (ref 22–32)
CREATININE: 0.83 mg/dL (ref 0.44–1.00)
Glucose, Bld: 101 mg/dL — ABNORMAL HIGH (ref 65–99)
Potassium: 4 mmol/L (ref 3.5–5.1)
Sodium: 138 mmol/L (ref 135–145)
Total Protein: 7.1 g/dL (ref 6.5–8.1)

## 2016-12-09 LAB — CBC WITH DIFFERENTIAL/PLATELET
BASOS ABS: 0 10*3/uL (ref 0.0–0.1)
Basophils Relative: 0 %
Eosinophils Absolute: 0.2 10*3/uL (ref 0.0–0.7)
Eosinophils Relative: 3 %
HEMATOCRIT: 43.3 % (ref 36.0–46.0)
HEMOGLOBIN: 13.6 g/dL (ref 12.0–15.0)
LYMPHS ABS: 1.5 10*3/uL (ref 0.7–4.0)
LYMPHS PCT: 28 %
MCH: 25.7 pg — AB (ref 26.0–34.0)
MCHC: 31.4 g/dL (ref 30.0–36.0)
MCV: 81.7 fL (ref 78.0–100.0)
Monocytes Absolute: 0.3 10*3/uL (ref 0.1–1.0)
Monocytes Relative: 6 %
NEUTROS ABS: 3.3 10*3/uL (ref 1.7–7.7)
NEUTROS PCT: 63 %
PLATELETS: 218 10*3/uL (ref 150–400)
RBC: 5.3 MIL/uL — AB (ref 3.87–5.11)
RDW: 15 % (ref 11.5–15.5)
WBC: 5.2 10*3/uL (ref 4.0–10.5)

## 2016-12-10 ENCOUNTER — Ambulatory Visit (HOSPITAL_COMMUNITY): Payer: Medicare Other | Admitting: Adult Health

## 2016-12-10 ENCOUNTER — Other Ambulatory Visit (HOSPITAL_COMMUNITY): Payer: Medicare Other

## 2016-12-21 ENCOUNTER — Other Ambulatory Visit (HOSPITAL_COMMUNITY): Payer: Self-pay

## 2016-12-21 ENCOUNTER — Other Ambulatory Visit (HOSPITAL_COMMUNITY): Payer: Self-pay | Admitting: Hematology & Oncology

## 2016-12-21 DIAGNOSIS — Z170 Estrogen receptor positive status [ER+]: Principal | ICD-10-CM

## 2016-12-21 DIAGNOSIS — C50412 Malignant neoplasm of upper-outer quadrant of left female breast: Secondary | ICD-10-CM

## 2016-12-21 NOTE — Telephone Encounter (Signed)
This encounter was created in error - please disregard.

## 2016-12-30 NOTE — Progress Notes (Signed)
[place], [place] [address]   CLINIC:  Medical Oncology/Hematology  PCP:  [name], MD [facility] and [place] [address] [phone]   REASON FOR VISIT:  Follow-up for Stage IIA invasive lobular carcinoma, ER+/PR+/HER2-  CURRENT THERAPY: Anastrozole daily, beginning 12/2015   BRIEF ONCOLOGIC HISTORY:    Breast cancer, left breast ([facility])   05/05/2015 Procedure    Left needle core biopsy      05/05/2015 Pathology Results    Breast, left, needle core biopsy, 2:00 o'clock - INVASIVE AND IN SITU MAMMARY CARCINOMA WITH CALCIFICATIONS.      06/03/2015 Procedure    Left breast biopsy      06/03/2015 Pathology Results    Breast, left, needle core biopsy, central - ATYPICAL DUCTAL HYPERPLASIA.      06/17/2015 Pathology Results    Breast, partial mastectomy, Left, upper outer quadrant with seed and needle loc - MULTIFOCAL INVASIVE GRADE II LOBULAR CARCINOMA, TWO FOCI MEASURING 1.8 CM AND 1.7 CM IN GREATEST DIMENSION.      06/17/2015 Procedure    Left partial mastectomy by Dr. Judeth Horn      06/17/2015 Initial Diagnosis    Breast cancer ([place])      07/10/2015 Procedure    Left breast re-excision by Dr. Marla Roe      07/10/2015 Pathology Results    Breast, excision, Left Re-excision - BENIGN BREAST TISSUE WITH BIOPSY CHANGES. - NO RESIDUAL TUMOR. - FINAL MARGINS CLEAR.Breast, left, needle core biopsy, outer - INVASIVE LOBULAR CARCINOMA.      08/01/2015 - 08/04/2015 Hospital Admission    Status post bilateral breast reduction  Status post partial mastectomy of left breast  Open wound of right breast  Open breast wound      08/11/2015 Miscellaneous    Mammaprint LOW RISK: No Potential Significant Chemotherapy Benefit      11/11/2015 - 12/15/2015 Radiation Therapy    Adjuvant radiation therapy; [place]; Left breast and regional LN to 50.4 GY/1.8 Gy          01/05/2016 Imaging      Bone density- BMD as determined from [facility] L1-L2 is 1.063 g/cm2 with a T-Score of -0.9. This patient is considered normal according to [facility]) criteria.      12/2015 -  Anti-estrogen oral therapy    Anastrozole daily         INTERVAL HISTORY:  Colleen Cohen 66 y.o. female returns for follow-up for history of Stage IIA left breast cancer.   Overall, she tells me she has been feeling well.  Appetite 50%; energy levels 100%. She is actively working on losing weight with a physician-assisted weight loss program in [place]. She is exercising as part of this program as well.  She has lost ~40 lbs thus far; her goal weight is ~180 lbs.  Reports that with her diet/supplements on this plan, she takes vitamin D.  She is not taking prescription-strength vitamin D any longer.  Her PCP also periodically checks her vitamin D per patient.   Remains on Arimidex with good tolerance. Her joint aches/pains have improved dramatically since her weight loss. Denies any hot flashes or vaginal dryness. No vaginal bleeding. Denies any new lumps in her breasts that she is aware of.    Continues to have (L) breast wound at intramammary fold area.  This lesion was biopsied by dermatology in 07/2016; per patient, the pathology was negative for cancer.  However, she has had significant difficulties with wound healing since that time.  She went to a wound center for many weeks and was recently released from follow-up there.  She began wearing her old bra and now has an open lesion again.  Reports some scant bloody drainage from this area at times; there is associated skin redness as well.  Shares with me that she has some remaining materials/ointments that the wound center was using to heal this wound previously. She is concerned it may be getting infected.  Denies any fever/chills.       REVIEW OF SYSTEMS:  Review of Systems  Constitutional: Negative for chills and fever.  HENT:  Negative.    Eyes: Negative.   Respiratory: Negative.  Negative for cough and shortness of breath.   Cardiovascular: Negative.  Negative for leg swelling.  Gastrointestinal: Positive for constipation.  Endocrine: Negative.  Negative for hot flashes.  Genitourinary: Negative.  Negative for dysuria, hematuria and vaginal bleeding.   Musculoskeletal: Negative.   Skin: Positive for wound.  Neurological: Negative.   Hematological: Negative.   Psychiatric/Behavioral: Negative.      PAST MEDICAL/SURGICAL HISTORY:  Past Medical History:  Diagnosis Date  . Anxiety   . Arthritis   . Breast cancer [facility]) oncologist-  [facility] ([facility] and Dr Sondra Come    dx 04/ 2017-- left breast lobular multifocal carcinoma x2 sites-- Stage 2 w/ mets x1 positive node--  ER/PR+,  HER2 negative --  s/p  left breast lumpectomy with radioactive seed implants and re-excision for positive margin 06-21-2015  . Depression   . GERD (gastroesophageal reflux disease)   . History of kidney stones   . Hypertension   . Hypothyroidism, postsurgical   . Migraine   . Myopia of both eyes   . Nephrolithiasis   . PONV (postoperative nausea and vomiting)   . Wears dentures   . Wears glasses   . Wound of right breast    Past Surgical History:  Procedure Laterality Date  . APPLICATION OF A-CELL OF CHEST/ABDOMEN Right 10/30/2015   Procedure: APPLICATION OF A-CELL RIGHT BREAST WOUND;  Surgeon: Loel Lofty Dillingham, DO;  Location: [place];  Service: Plastics;  Laterality: Right;  . APPLICATION OF WOUND VAC Right 08/14/2015   Procedure: APPLICATION OF WOUND VAC;  Surgeon: Loel Lofty Dillingham, DO;  Location: [place];  Service: Plastics;  Laterality: Right;  . BREAST LUMPECTOMY WITH RADIOACTIVE SEED AND SENTINEL LYMPH NODE BIOPSY Left 06/17/2015   Procedure: LEFT BREAST LUMPECTOMY WITH RADIOACTIVE SEED AND SENTINEL LYMPH NODE BIOPSY, NEEDLE LOCALIZATION IN ATYPICAL DUCTAL HYPERPLASIA  AREA OF LEFT BREAST;  Surgeon:  Judeth Horn, MD;  Location: [place];  Service: General;  Laterality: Left;  . BREAST RECONSTRUCTION Left 07/10/2015   Procedure: REVISION OF LEFT BREAST REDUCTION;  Surgeon: Loel Lofty Dillingham, DO;  Location: [place];  Service: Plastics;  Laterality: Left;  . BREAST REDUCTION SURGERY Bilateral 06/17/2015   Procedure: BILATERAL BREAST  REDUCTION  WITH RECONSTRUCTION  ON LEFT  AFTER PARTIAL MASTECTOMY;  Surgeon: Wallace Going, DO;  Location: [place];  Service: Plastics;  Laterality: Bilateral;  . CARPAL TUNNEL RELEASE Right x2  last one 2008  . CHOLECYSTECTOMY  2007  . CYSTO/  URETEROSCOPIC LASER LITHOTRIPSY / STONE EXTRACTION'S/  STENT PLACEMENT  x3  last one 2014  . INCISION AND DRAINAGE OF WOUND Right 08/14/2015   Procedure: Excision of right breast WOUND;  Surgeon: Loel Lofty Dillingham, DO;  Location: [facility];  Service: Plastics;  Laterality: Right;  . INCISION AND DRAINAGE OF WOUND Right 09/22/2015   Procedure: IRRIGATION AND DEBRIDEMENT WOUND;  Surgeon: Wallace Going, DO;  Location: [place];  Service: Plastics;  Laterality: Right;  . INCISION AND DRAINAGE OF WOUND Right 10/30/2015   Procedure: IRRIGATION AND DEBRIDEMENT RIGHT BREAST WOUND;  Surgeon: Loel Lofty Dillingham, DO;  Location: [place];  Service: Plastics;  Laterality: Right;  . LUMBAR FUSION  2008   L1 -- L2  . MASS EXCISION Left 09/22/2015   Procedure: EXCISION LEFT BREAST MASS;  Surgeon: Wallace Going, DO;  Location: [place];  Service: Plastics;  Laterality: Left;  . NEGATIVE SLEEP STUDY  2007  (approx)  per pt  . PARTIAL NEPHRECTOMY Left 1972   "part of kidney rotted"  . RE-EXCISION OF BREAST LUMPECTOMY Left 07/10/2015   Procedure: RE-EXCISION OF LEFT BREAST LUMPECTOMY ;  Surgeon: Judeth Horn, MD;  Location: [place];  Service: General;  Laterality: Left;  . REDUCTION MAMMAPLASTY     bilaterally  . RETINAL DETACHMENT SURGERY Right  2015  [facility]   and Repair macular hole  . SHOULDER ARTHROSCOPY Right 2013  . THYROIDECTOMY Bilateral 2012     SOCIAL HISTORY:  Social History   Socioeconomic History  . Marital status: Married    Spouse name: Not on file  . Number of children: Not on file  . Years of education: Not on file  . Highest education level: Not on file  Social Needs  . Financial resource strain: Not on file  . Food insecurity - worry: Not on file  . Food insecurity - inability: Not on file  . Transportation needs - medical: Not on file  . Transportation needs - non-medical: Not on file  Occupational History  . Not on file  Tobacco Use  . Smoking status: Never Smoker  . Smokeless tobacco: Never Used  Substance and Sexual Activity  . Alcohol use: Yes    Comment: rare  . Drug use: No  . Sexual activity: Not on file  Other Topics Concern  . Not on file  Social History Narrative  . Not on file    FAMILY HISTORY:  Family History  Problem Relation Age of Onset  . Renal cancer Mother   . Breast cancer Sister     CURRENT MEDICATIONS:  Outpatient Encounter Medications as of 12/31/2016  Medication Sig Note  . anastrozole (ARIMIDEX) 1 MG tablet Take 1 tablet (1 mg total) by mouth daily.   Marland Kitchen aspirin 81 MG chewable tablet Chew 81 mg by mouth daily.   . diazepam (VALIUM) 10 MG tablet Take 10 mg by mouth 2 (two) times daily as needed.    . ergocalciferol (VITAMIN D2) 50000 units capsule Take 1 capsule (50,000 Units total) by mouth once a week.   Marland Kitchen FLUoxetine (PROZAC) 20 MG capsule Take 20 mg by mouth every morning.   Marland Kitchen levothyroxine (SYNTHROID, LEVOTHROID) 112 MCG tablet Take by mouth.   . metoprolol succinate (TOPROL-XL) 50 MG 24 hr tablet Take 50 mg by mouth every morning. Take with or immediately following a meal. 08/11/2015: .  . oxyCODONE (OXY IR/ROXICODONE) 5 MG immediate release tablet Take 1 tablet (5 mg total) by mouth every 4 (four) hours as needed.   . pantoprazole (PROTONIX) 40 MG tablet Take  40 mg by mouth every morning.   . phenazopyridine (PYRIDIUM) 200 MG tablet Take 200 mg by mouth every 8 (eight)  hours as needed.   . topiramate (TOPAMAX) 100 MG tablet Take 100 mg by mouth at bedtime.   . [DISCONTINUED] anastrozole (ARIMIDEX) 1 MG tablet Take 1 tablet (1 mg total) by mouth daily.   . [DISCONTINUED] anastrozole (ARIMIDEX) 1 MG tablet Take 1 mg by mouth. 02/02/2016: Received from: [facility]: Take 1 mg by mouth.   No facility-administered encounter medications on file as of 12/31/2016.     ALLERGIES:  Allergies  Allergen Reactions  . Bactrim [Sulfamethoxazole-Trimethoprim] Other (See Comments)    Lactic acid increase Lactic acid increase  . Vancomycin Hives  . Codeine Rash     PHYSICAL EXAM:  ECOG Performance status: 1 - Symptomatic; remains largely independent   Vitals:   12/31/16 1410  BP: (!) 147/63  Pulse: 65  Resp: 18  Temp: 98.2 F (36.8 C)  SpO2: 96%   Filed Weights   12/31/16 1410  Weight: 212 lb 8 oz (96.4 kg)    Physical Exam  Constitutional: She is oriented to person, place, and time and well-developed, well-nourished, and in no distress.  HENT:  Head: Normocephalic.  Mouth/Throat: Oropharynx is clear and moist.  Eyes: Conjunctivae are normal. Pupils are equal, round, and reactive to light. No scleral icterus.  Neck: Normal range of motion. Neck supple.  Cardiovascular: Normal rate and regular rhythm.  Pulmonary/Chest: Breath sounds normal. No respiratory distress. She has no wheezes.    Abdominal: Soft. Bowel sounds are normal. There is no tenderness.  Musculoskeletal: Normal range of motion. She exhibits no edema.  Lymphadenopathy:    She has no cervical adenopathy.       Right: No supraclavicular adenopathy present.       Left: No supraclavicular adenopathy present.  Neurological: She is alert and oriented to person, place, and time. No cranial nerve deficit.  Skin: Skin is warm and dry.  See photo of (L)  breast/intramammary fold wound  Psychiatric: Mood, memory, affect and judgment normal.  Nursing note and vitals reviewed.   (L) breast ulceration: Photo taken after verbal permission from patient and in the presence of Cleta Alberts, RN.      LABORATORY DATA:  I have reviewed the labs as listed.  CBC    Component Value Date/Time   WBC 5.2 12/09/2016 0841   RBC 5.30 (H) 12/09/2016 0841   HGB 13.6 12/09/2016 0841   HCT 43.3 12/09/2016 0841   PLT 218 12/09/2016 0841   MCV 81.7 12/09/2016 0841   MCH 25.7 (L) 12/09/2016 0841   MCHC 31.4 12/09/2016 0841   RDW 15.0 12/09/2016 0841   LYMPHSABS 1.5 12/09/2016 0841   MONOABS 0.3 12/09/2016 0841   EOSABS 0.2 12/09/2016 0841   BASOSABS 0.0 12/09/2016 0841   CMP Latest Ref Rng & Units 12/09/2016 08/10/2016 05/11/2016  Glucose 65 - 99 mg/dL 101(H) 120(H) 96  BUN 6 - 20 mg/dL _0 Creatinine 0.44 - 1.00 mg/dL 0.83 0.91 0.76  Sodium 135 - 145 mmol/L 138 141 140  Potassium 3.5 - 5.1 mmol/L 4.0 3.5 4.1  Chloride 101 - 111 mmol/L 103 104 105  CO2 22 - 32 mmol/L _1 Calcium 8.9 - 10.3 mg/dL 9.9 9.5 9.5  Total Protein 6.5 - 8.1 g/dL 7.1 7.0 6.8  Total Bilirubin 0.3 - 1.2 mg/dL 0.6 0.4 0.4  Alkaline Phos 38 - 126 U/L 89 59 52  AST 15 - 41 U/L _2 ALT 14 - 54 U/L 29 13(L) 13(L)  PENDING LABS:    DIAGNOSTIC IMAGING:  *The following radiologic images and reports have been reviewed independently and agree with below findings.  Most recent mammogram: 06/01/16    DEXA scan: 01/05/16        PATHOLOGY:  (L) lumpectomy surgical path: 06/17/15             ASSESSMENT & PLAN:   Stage IIA invasive lobular carcinoma of (L) breast, ER+/PR+/HER2-: -Diagnosed in 04/2015. Treated with lumpectomy, followed by re-excision, then bilateral breast reduction surgery. Post-op course was complicated by open right breast wounds requiring wound vac. Mammaprint genomic testing revealed low risk results (no benefit from  chemotherapy).  She went on to complete adjuvant breast radiation in [place] on 12/15/15. Started anti-estrogen therapy with Anastrozole in 12/2015.  -Clinical breast exam performed today; new right breast nodule (see below). Will obtain diagnostic mammogram and ultrasound for further evaluation. If this imaging is negative for malignancy (which I suspect will be the case), then will obtain annual diagnostic mammogram in 05/2017 prior to follow-up visit.  -Return to cancer center in 5 months for follow-up in 05/2017 with labs.   (R) breast nodule:  -Small sub-cm nodule to (R) breast at approx 6 o'clock position palpable that is new from previous physical exam.  I have low clinical suspicion that this nodule is cancer. However, given her history, will obtain diagnostic mammogram and ultrasound (if needed).  We will contact her with those results. Provided reassurance that I suspect this nodularity is scar tissue from previous mammoplasty. However, given that it is new from previous exam, further imaging is warranted. She agrees.   -If this imaging work-up is negative (as suspected), then she will proceed with annual mammogram in 05/2017 prior to next follow-up visit.   (L) breast wound:  -This is slight erythema and induration surrounding this wound. Area recently biopsied by dermatology and path was negative per patient (we have requested formal path results from their office).   -Given that this wound previously healed and has now recurred, will obtain diagnostic imaging at the time we are planning to image (R) breast.   -Discussed short-course of oral antibiotics to help with any possible superimposed infection of this wound. However, she declines antibiotic therapy today d/t concerns re: side effects of antibiotics, particularly vaginal yeast infection. I offered to give her Diflucan to treat vaginal yeast infection if she has one with antibiotic therapy, but she again declined.  Gave her strict instructions  that if her conservative wound management strategies do not resolve the wound in the next several days, then she should contact her wound center and return to see them for further evaluation and treatment. She agreed with this plan.   Bone health:  -Last DEXA scan 01/05/16; normal bone density with T-score -0.9.  -She understands that aromatase inhibitor therapy can weaken bone density over time. Encouraged her to continue vitamin D supplementation as directed by her PCP. Continue weight-bearing exercises as tolerated.  -Repeat DEXA scan imaging will be due in 12/2017; orders to be placed at subsequent follow-up appointments.  Health maintenance/Wellness promotion:  -Commended her efforts to lose weight with diet and exercise.  Continue follow-up with PCP as directed.  -Reports that she received annual flu vaccine for this year.        Dispo:  -Diagnostic mammogram and ultrasound as soon as able.  -Return to cancer center in 5 months or sooner for follow-up with labs.    All questions were answered to patient's stated  satisfaction. Encouraged patient to call with any new concerns or questions before her next visit to the cancer center and we can certain see her sooner, if needed.    Plan of care discussed with Dr. Talbert Cage, who agrees with the above aforementioned.    A total of 30 minutes was spent in face-to-face care of this patient, with greater than 50% of that time spent in counseling and care-coordination.    Orders placed this encounter:  Orders Placed This Encounter  Procedures  . MM DIAG BREAST TOMO BILATERAL  . US BREAST LTD UNI RIGHT INC AXILLA  . US BREAST LTD UNI LEFT INC AXILLA      Tyre Beaver, NP [place] [phone]

## 2016-12-31 ENCOUNTER — Other Ambulatory Visit: Payer: Self-pay

## 2016-12-31 ENCOUNTER — Encounter (HOSPITAL_COMMUNITY): Payer: Self-pay | Admitting: Adult Health

## 2016-12-31 ENCOUNTER — Encounter (HOSPITAL_COMMUNITY): Payer: Medicare Other | Attending: Oncology | Admitting: Adult Health

## 2016-12-31 VITALS — BP 147/63 | HR 65 | Temp 98.2°F | Resp 18 | Wt 212.5 lb

## 2016-12-31 DIAGNOSIS — N6310 Unspecified lump in the right breast, unspecified quadrant: Secondary | ICD-10-CM | POA: Diagnosis not present

## 2016-12-31 DIAGNOSIS — S21002D Unspecified open wound of left breast, subsequent encounter: Secondary | ICD-10-CM

## 2016-12-31 DIAGNOSIS — C50412 Malignant neoplasm of upper-outer quadrant of left female breast: Secondary | ICD-10-CM

## 2016-12-31 DIAGNOSIS — C50419 Malignant neoplasm of upper-outer quadrant of unspecified female breast: Secondary | ICD-10-CM | POA: Insufficient documentation

## 2016-12-31 DIAGNOSIS — Z170 Estrogen receptor positive status [ER+]: Secondary | ICD-10-CM

## 2016-12-31 MED ORDER — ANASTROZOLE 1 MG PO TABS: 1.0000 mg | ORAL_TABLET | Freq: Every day | ORAL | 1 refills | Status: DC

## 2016-12-31 NOTE — Pre-Procedure Instructions (Signed)
(  L) breast wound

## 2016-12-31 NOTE — Patient Instructions (Addendum)
[place] at [facility] Discharge Instructions  RECOMMENDATIONS MADE BY THE CONSULTANT AND ANY TEST RESULTS WILL BE SENT TO YOUR REFERRING PHYSICIAN.  You were seen today by Mike Craze, NP Follow up in 5 months with labs Mammogram and ultrasound as soon as possible See schedulers up front for appointments   Thank you for choosing [place] at [facility] to provide your oncology and hematology care.  To afford each patient quality time with our provider, please arrive at least 15 minutes before your scheduled appointment time.    If you have a lab appointment with the [facility] please come in thru the  Main Entrance and check in at the main information desk  You need to re-schedule your appointment should you arrive 10 or more minutes late.  We strive to give you quality time with our providers, and arriving late affects you and other patients whose appointments are after yours.  Also, if you no show three or more times for appointments you may be dismissed from the clinic at the providers discretion.     Again, thank you for choosing [facility].  Our hope is that these requests will decrease the amount of time that you wait before being seen by our physicians.       _____________________________________________________________  Should you have questions after your visit to [facility], please contact our office at [phone] between the hours of 8:30 a.m. and 4:30 p.m.  Voicemails left after 4:30 p.m. will not be returned until the following business day.  For prescription refill requests, have your pharmacy contact our office.       Resources For Cancer Patients and their Caregivers ? American Cancer Society: Can assist with transportation, wigs, general needs, runs Look Good Feel Better.        (501)863-2051 ? Cancer Care: Provides financial assistance, online support groups, medication/co-pay  assistance.  1-800-813-HOPE (587)884-1588) ? [facility] Assists [place] cancer patients and their families through emotional , educational and financial support.  (878)229-7235 ? [place] DSS Where to apply for food stamps, Medicaid and utility assistance. 310-219-4792 ? [facility]: Transportation to medical appointments. 480-266-1765 ? Social Security Administration: May apply for disability if have a Stage IV cancer. 6313144181 9175445810 ? [employer], Disability and Transit Services: Assists with nutrition, care and transit needs. [place] Support Programs: @10RELATIVEDAYS @ > Cancer Support Group  2nd Tuesday of the month 1pm-2pm, Journey Room  > Creative Journey  3rd Tuesday of the month 1130am-1pm, Journey Room  > Look Good Feel Better  1st Wednesday of the month 10am-12 noon, Journey Room (Call [place] to register [phone])

## 2017-01-11 ENCOUNTER — Ambulatory Visit (HOSPITAL_COMMUNITY)
Admission: RE | Admit: 2017-01-11 | Discharge: 2017-01-11 | Disposition: A | Discharge status: Home / Self Care | Payer: Medicare Other | Source: Ambulatory Visit | Attending: Adult Health | Admitting: Adult Health

## 2017-01-11 ENCOUNTER — Other Ambulatory Visit (HOSPITAL_COMMUNITY): Payer: Self-pay | Admitting: Adult Health

## 2017-01-11 ENCOUNTER — Encounter (HOSPITAL_COMMUNITY): Payer: Self-pay

## 2017-01-11 DIAGNOSIS — N6313 Unspecified lump in the right breast, lower outer quadrant: Secondary | ICD-10-CM | POA: Insufficient documentation

## 2017-01-11 DIAGNOSIS — R928 Other abnormal and inconclusive findings on diagnostic imaging of breast: Secondary | ICD-10-CM | POA: Diagnosis not present

## 2017-01-11 DIAGNOSIS — C50412 Malignant neoplasm of upper-outer quadrant of left female breast: Secondary | ICD-10-CM

## 2017-01-11 DIAGNOSIS — N6001 Solitary cyst of right breast: Secondary | ICD-10-CM | POA: Diagnosis not present

## 2017-01-11 DIAGNOSIS — Z170 Estrogen receptor positive status [ER+]: Secondary | ICD-10-CM | POA: Diagnosis present

## 2017-01-11 DIAGNOSIS — N6310 Unspecified lump in the right breast, unspecified quadrant: Secondary | ICD-10-CM

## 2017-01-11 DIAGNOSIS — N641 Fat necrosis of breast: Secondary | ICD-10-CM

## 2017-04-26 IMAGING — MG MM PLC BREAST LOC DEV 1ST LESION INC MAMMO GUIDE*L*
7 series · 7 of 7 positions shown · non-contrast
Comparison: Previous exam(s).

CLINICAL DATA: Patient presents for localization prior to left
lumpectomy for known invasive mammary and invasive lobular
carcinoma.

[L LM (1 of 2)]
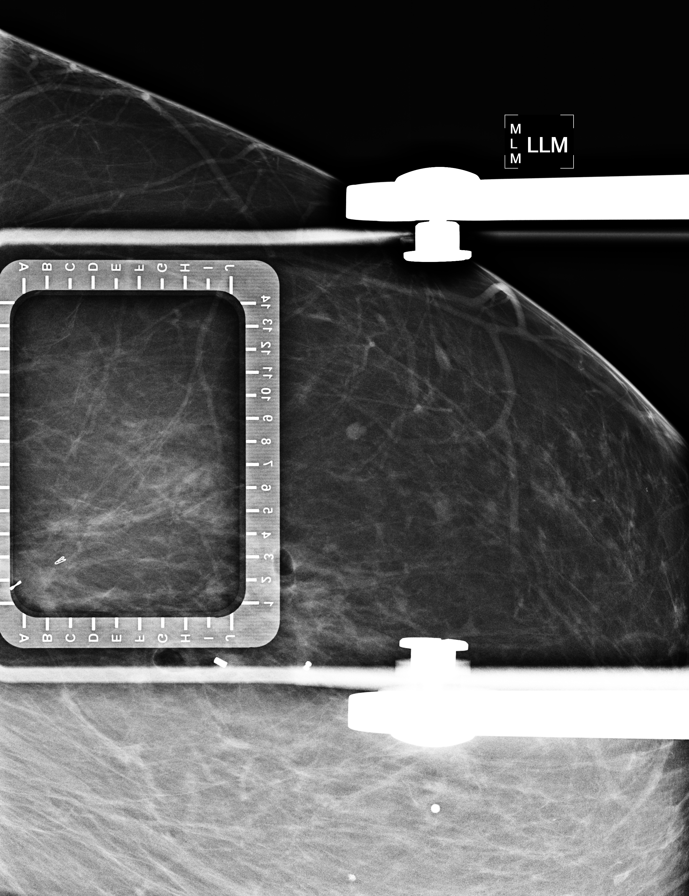

[L LM (2 of 2)]
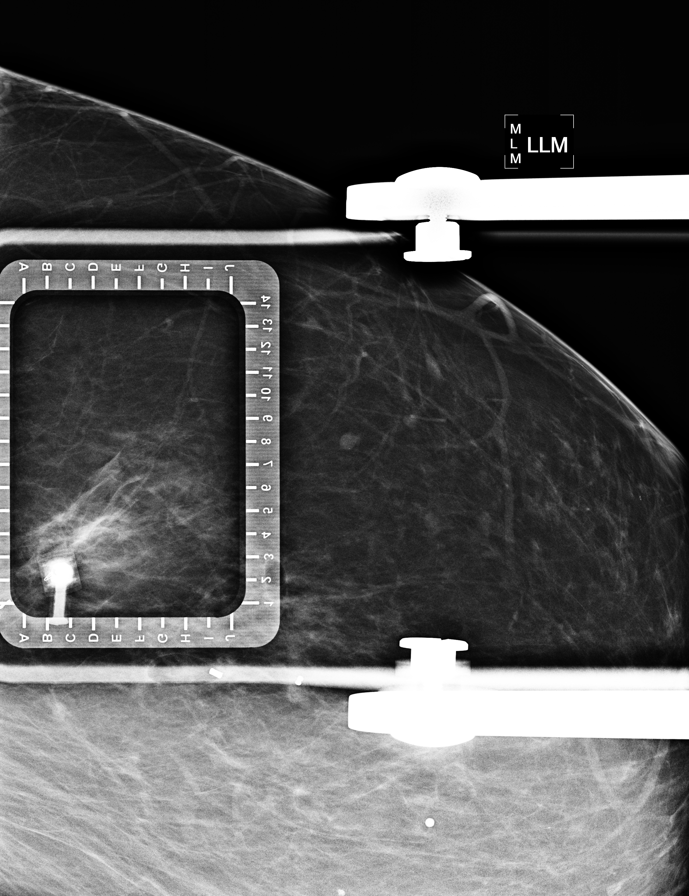

[L CC (1 of 4)]
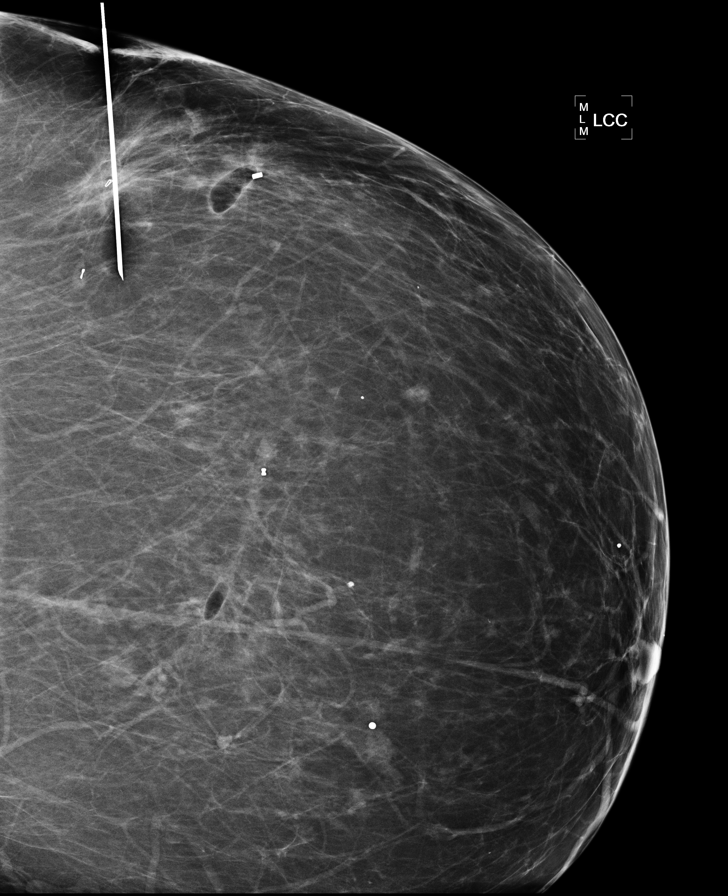

[L CC (2 of 4)]
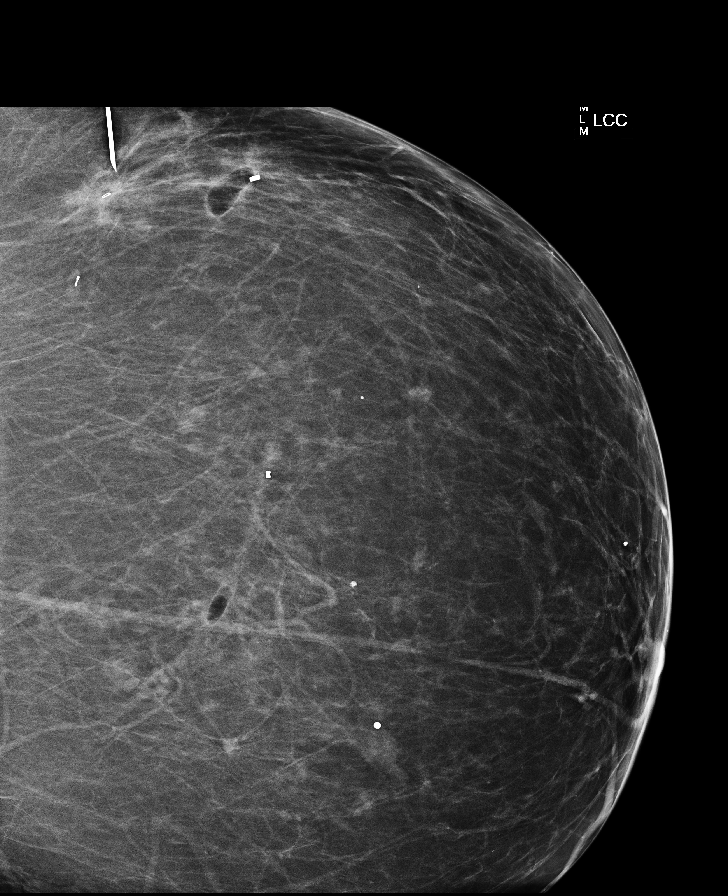

[L CC (3 of 4)]
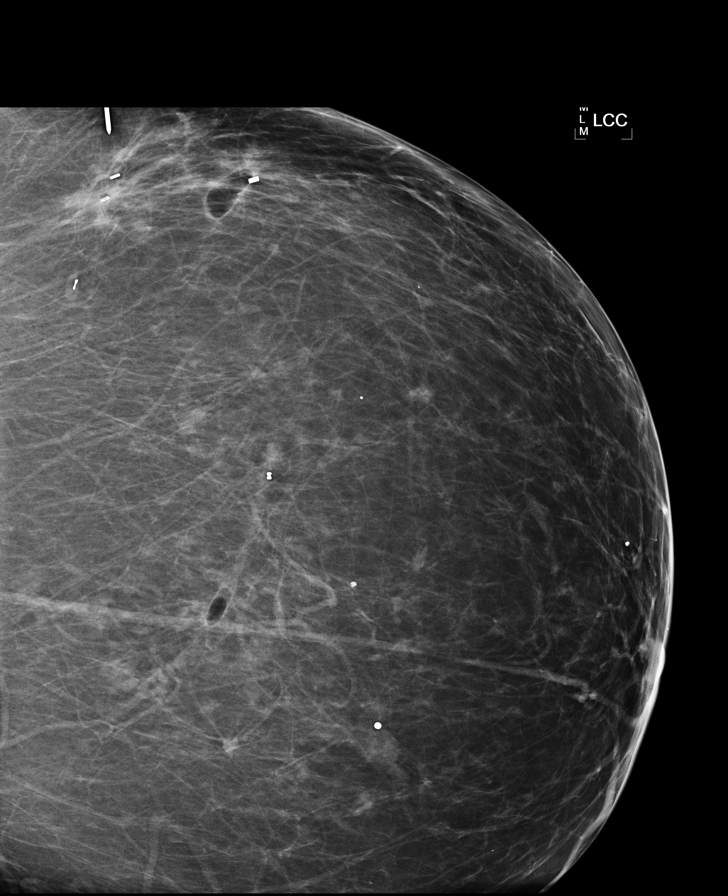

[L CC (4 of 4)]
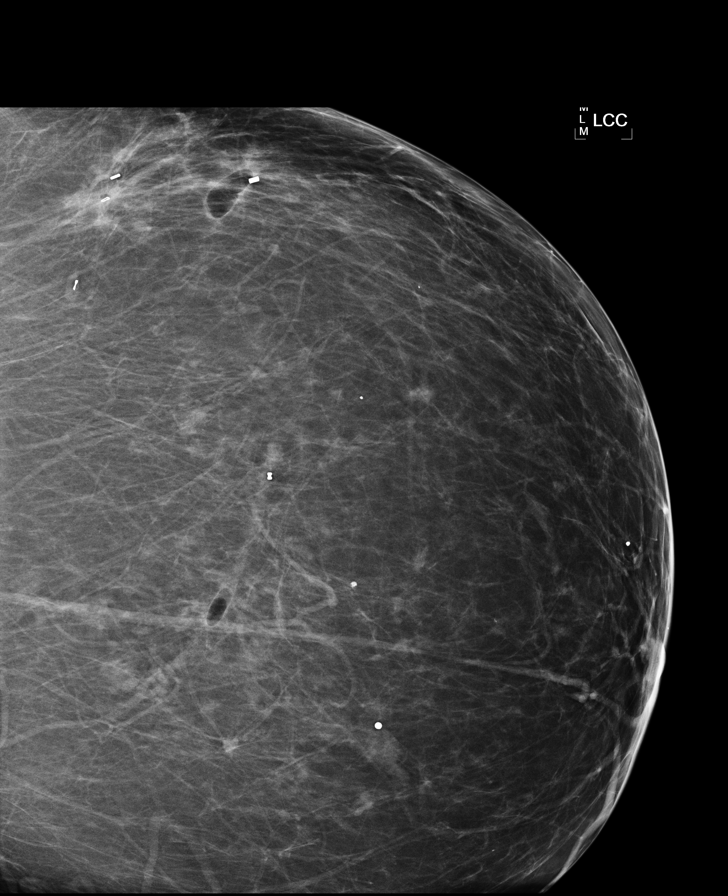

[L ML]
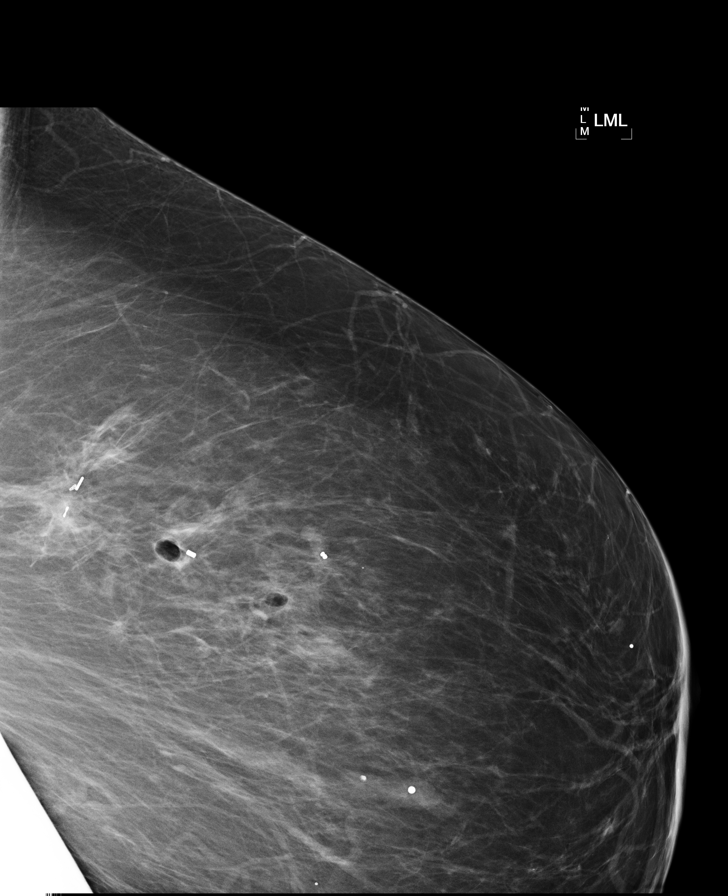

[7 of 7 positions shown; findings below may reference images not displayed]

On recent image guided core biopsies, on recent ultrasound-guided
core biopsy patient has invasive mammary carcinoma in the 2 o'clock
location of the left breast (Heart shaped clip). Subsequent MR
guided core biopsy shows invasive lobular carcinoma in the lateral
portion of the left breast ( cylinder-shaped clip) a second MR
guided core biopsy demonstrated atypia in the central portion of the
breast (dumbbell shaped clip).

EXAM:
MAMMOGRAPHIC GUIDED RADIOACTIVE SEED LOCALIZATION OF THE LEFT BREAST

MAMMOGRAPHIC GUIDED WIRE LOCALIZATION OF THE LEFT BREAST X2
FINDINGS: RADIOACTIVE SEED LOCALIZATION: Patient presents for radioactive seed
localization prior to LUMPECTOMY/EXCISION. I met with the patient
and we discussed the procedure of seed localization including
benefits and alternatives. We discussed the high likelihood of a
successful procedure. We discussed the risks of the procedure
including infection, bleeding, tissue injury and further surgery. We
discussed the low dose of radioactivity involved in the procedure.
Informed, written consent was given.

The usual time-out protocol was performed immediately prior to the
procedure.

Using mammographic guidance, sterile technique, 1% lidocaine and an
E-1P5 radioactive seed, the heart shaped clip in the lateral portion
of the left breast was localized using a lateral approach. The
follow-up mammogram images confirm the seed in the expected location
and were marked for Dr. Siles.

Follow-up survey of the patient confirms presence of the radioactive
seed.

Order number of E-1P5 seed:  [id number].

Total activity:  0.250 millicuries  Reference Date: 06/13/2015

The patient tolerated the procedure well and was released from the
[REDACTED]. She was given instructions regarding seed removal.

WIRE LOCALIZATION: Using mammographic guidance, sterile technique,
1% lidocaine and a 5 cm modified Kopans needle, the cylinder-shaped
clip in the lateral portion of the left breast was localized using
lateral approach.

WIRE LOCALIZATION: Using mammographic guidance, sterile technique,
1% lidocaine and a 15 cm modified Kopans needle, the dumbbell-shaped
clip in the central portion of the left breast was localized using a
lateral approach.

The images were marked for Dr. Siles. Of note, a twist shaped clip
also in the posterior upper outer quadrant of the left breast was
not localized, placed at the time of ultrasound-guided core biopsy
showing discordant findings. Procedure was discussed with Dr. Siles.
IMPRESSION: 1. Radioactive seed localization left breast- Heart shaped clip
marking IMC.
2. Wire localization left breast - cylinder-shaped clip marking ILC.
3. Wire localization left breast - dumbbell shaped clip marking
atypia.
4. No apparent complications.

## 2017-04-26 IMAGING — MG MM PLC BREAST LOC DEV 1ST LESION INC MAMMO GUIDE*L*
4 series · 4 of 4 positions shown · non-contrast
Comparison: Previous exam(s).

CLINICAL DATA: Patient presents for localization prior to left
lumpectomy for known invasive mammary and invasive lobular
carcinoma.

[L LM (1 of 2)]
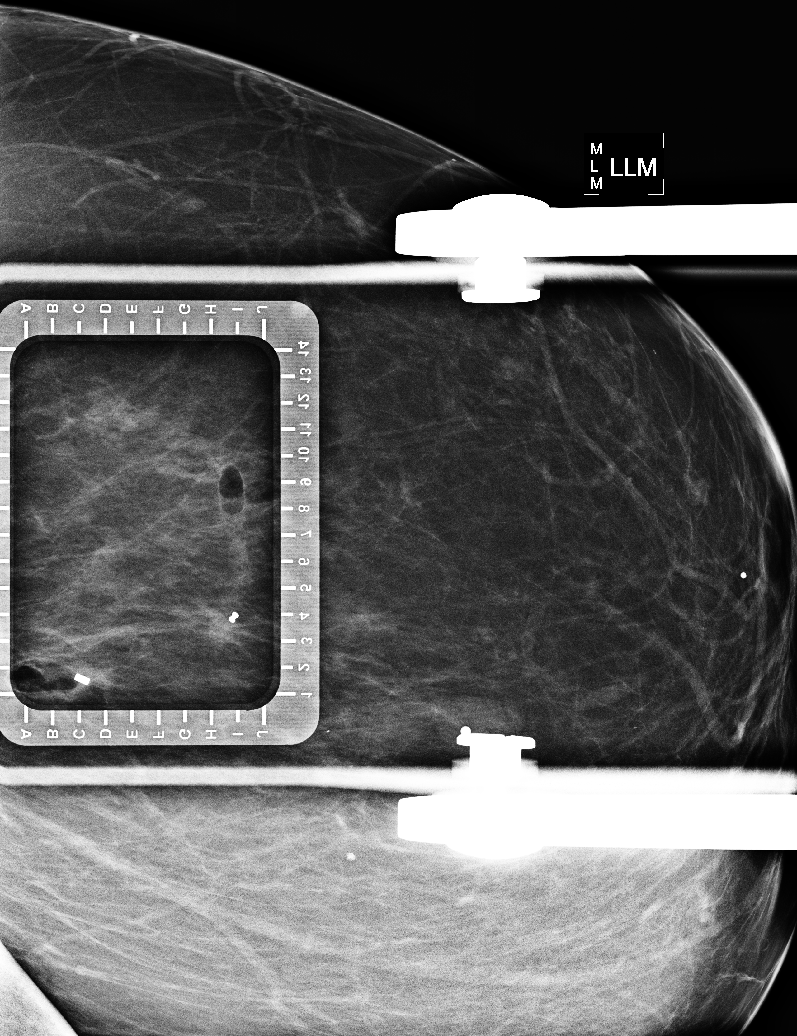

[L LM (2 of 2)]
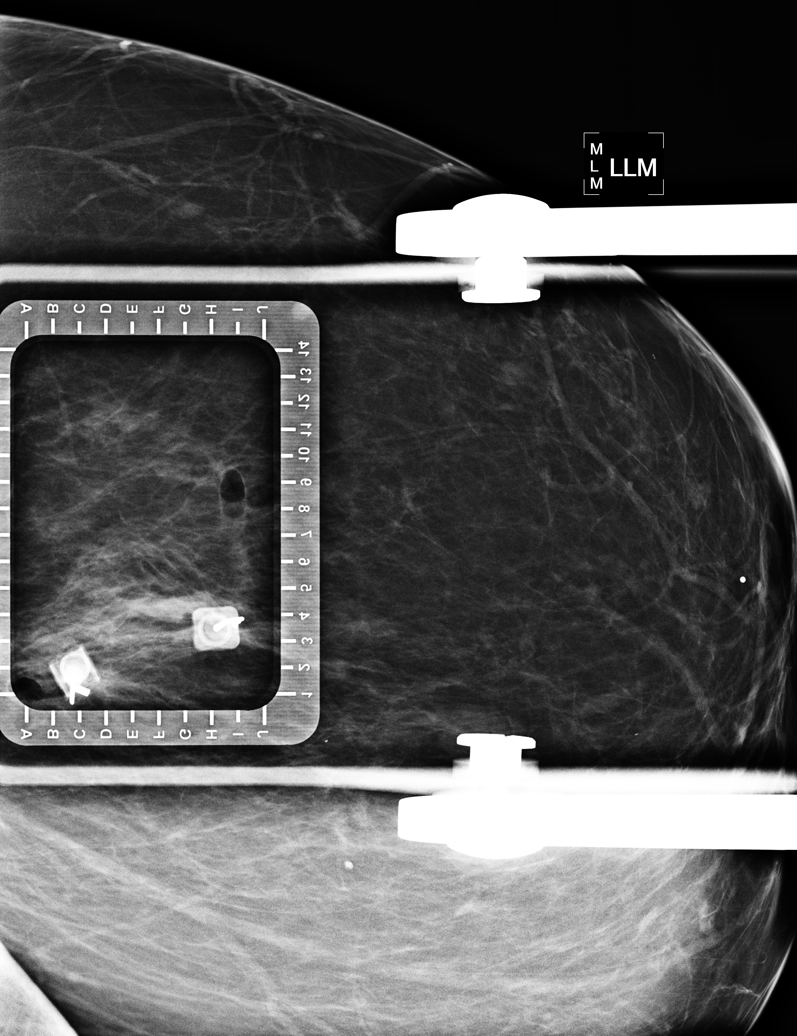

[L CC (1 of 2)]
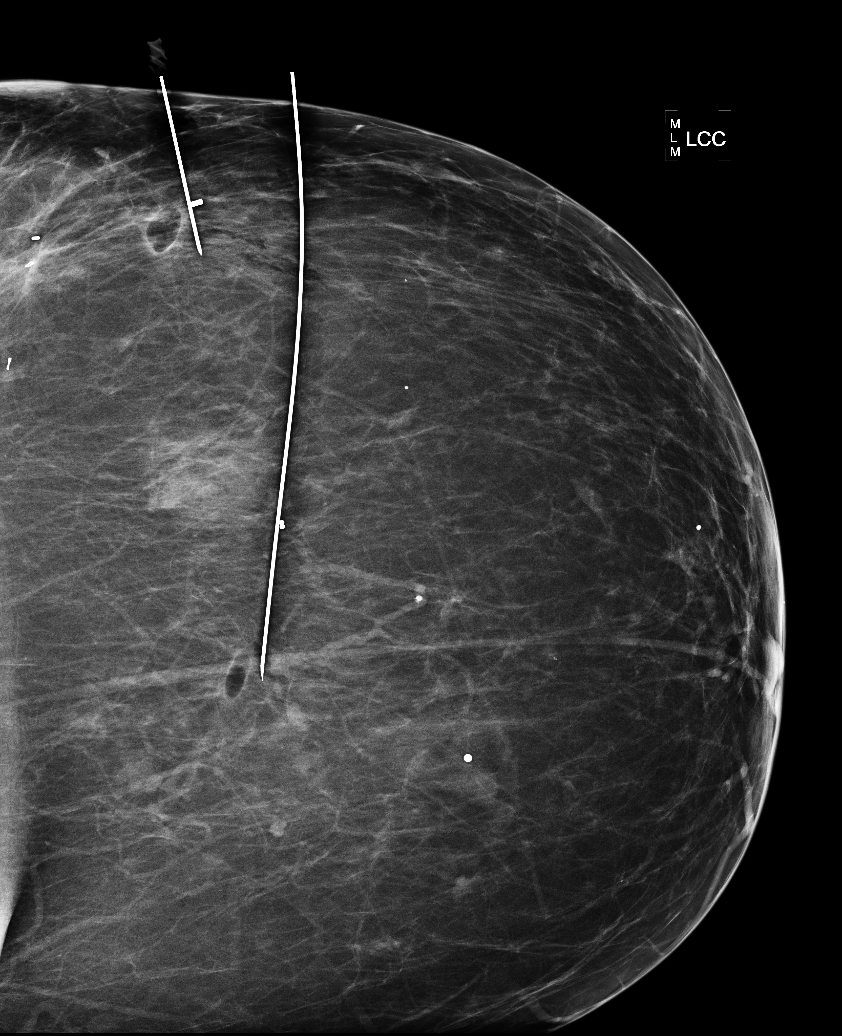

[L CC (2 of 2)]
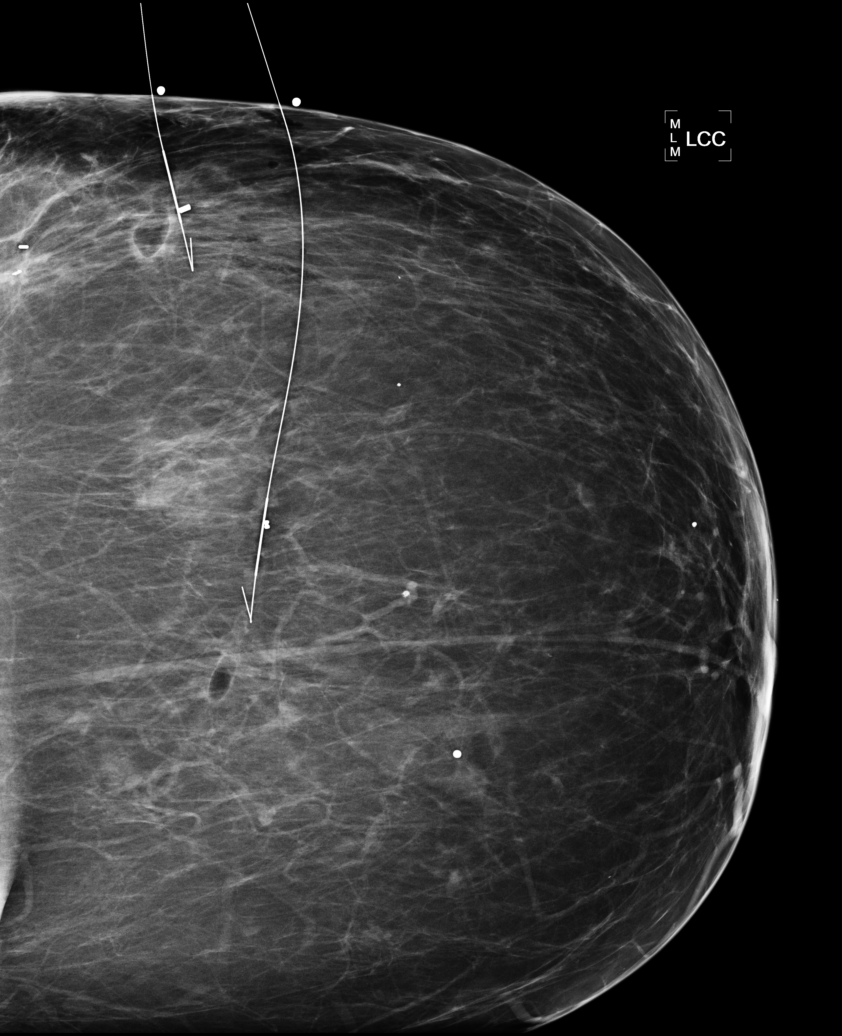

[4 of 4 positions shown; findings below may reference images not displayed]

On recent image guided core biopsies, on recent ultrasound-guided
core biopsy patient has invasive mammary carcinoma in the 2 o'clock
location of the left breast (Heart shaped clip). Subsequent MR
guided core biopsy shows invasive lobular carcinoma in the lateral
portion of the left breast ( cylinder-shaped clip) a second MR
guided core biopsy demonstrated atypia in the central portion of the
breast (dumbbell shaped clip).

EXAM:
MAMMOGRAPHIC GUIDED RADIOACTIVE SEED LOCALIZATION OF THE LEFT BREAST

MAMMOGRAPHIC GUIDED WIRE LOCALIZATION OF THE LEFT BREAST X2
FINDINGS: RADIOACTIVE SEED LOCALIZATION: Patient presents for radioactive seed
localization prior to LUMPECTOMY/EXCISION. I met with the patient
and we discussed the procedure of seed localization including
benefits and alternatives. We discussed the high likelihood of a
successful procedure. We discussed the risks of the procedure
including infection, bleeding, tissue injury and further surgery. We
discussed the low dose of radioactivity involved in the procedure.
Informed, written consent was given.

The usual time-out protocol was performed immediately prior to the
procedure.

Using mammographic guidance, sterile technique, 1% lidocaine and an
E-1P5 radioactive seed, the heart shaped clip in the lateral portion
of the left breast was localized using a lateral approach. The
follow-up mammogram images confirm the seed in the expected location
and were marked for Dr. Siles.

Follow-up survey of the patient confirms presence of the radioactive
seed.

Order number of E-[id number] seed:  [id number].

Total activity:  0.250 millicuries  Reference Date: 06/13/2015

The patient tolerated the procedure well and was released from the
[REDACTED]. She was given instructions regarding seed removal.

WIRE LOCALIZATION: Using mammographic guidance, sterile technique,
1% lidocaine and a 5 cm modified Kopans needle, the cylinder-shaped
clip in the lateral portion of the left breast was localized using
lateral approach.

WIRE LOCALIZATION: Using mammographic guidance, sterile technique,
1% lidocaine and a 15 cm modified Kopans needle, the dumbbell-shaped
clip in the central portion of the left breast was localized using a
lateral approach.

The images were marked for Dr. Siles. Of note, a twist shaped clip
also in the posterior upper outer quadrant of the left breast was
not localized, placed at the time of ultrasound-guided core biopsy
showing discordant findings. Procedure was discussed with Dr. Siles.
IMPRESSION: 1. Radioactive seed localization left breast- Heart shaped clip
marking IMC.
2. Wire localization left breast - cylinder-shaped clip marking ILC.
3. Wire localization left breast - dumbbell shaped clip marking
atypia.
4. No apparent complications.

## 2017-06-03 ENCOUNTER — Ambulatory Visit (HOSPITAL_COMMUNITY): Payer: Medicare Other

## 2017-06-03 ENCOUNTER — Other Ambulatory Visit (HOSPITAL_COMMUNITY): Payer: Medicare Other

## 2017-06-07 ENCOUNTER — Ambulatory Visit (HOSPITAL_COMMUNITY)
Admission: RE | Admit: 2017-06-07 | Discharge: 2017-06-07 | Disposition: A | Discharge status: Home / Self Care | Payer: Medicare Other | Source: Ambulatory Visit | Attending: Adult Health | Admitting: Adult Health

## 2017-06-07 DIAGNOSIS — N641 Fat necrosis of breast: Secondary | ICD-10-CM

## 2017-06-07 DIAGNOSIS — Z170 Estrogen receptor positive status [ER+]: Secondary | ICD-10-CM

## 2017-06-07 DIAGNOSIS — Z9889 Other specified postprocedural states: Secondary | ICD-10-CM | POA: Insufficient documentation

## 2017-06-07 DIAGNOSIS — C50412 Malignant neoplasm of upper-outer quadrant of left female breast: Secondary | ICD-10-CM | POA: Insufficient documentation

## 2017-06-10 ENCOUNTER — Other Ambulatory Visit (HOSPITAL_COMMUNITY): Payer: Self-pay

## 2017-06-10 DIAGNOSIS — C50012 Malignant neoplasm of nipple and areola, left female breast: Secondary | ICD-10-CM

## 2017-06-14 ENCOUNTER — Inpatient Hospital Stay (HOSPITAL_COMMUNITY): Payer: Medicare Other | Admitting: Internal Medicine

## 2017-06-14 ENCOUNTER — Other Ambulatory Visit (HOSPITAL_COMMUNITY): Payer: Medicare Other

## 2017-06-14 ENCOUNTER — Inpatient Hospital Stay (HOSPITAL_COMMUNITY): Payer: Medicare Other | Attending: Hematology

## 2017-06-14 ENCOUNTER — Ambulatory Visit (HOSPITAL_COMMUNITY): Payer: Medicare Other | Admitting: Adult Health

## 2017-06-14 ENCOUNTER — Other Ambulatory Visit: Payer: Self-pay

## 2017-06-14 VITALS — BP 153/69 | HR 56 | Temp 97.8°F | Resp 18 | Wt 198.2 lb

## 2017-06-14 DIAGNOSIS — C50412 Malignant neoplasm of upper-outer quadrant of left female breast: Secondary | ICD-10-CM | POA: Insufficient documentation

## 2017-06-14 DIAGNOSIS — Z9012 Acquired absence of left breast and nipple: Secondary | ICD-10-CM | POA: Diagnosis not present

## 2017-06-14 DIAGNOSIS — Z170 Estrogen receptor positive status [ER+]: Secondary | ICD-10-CM | POA: Insufficient documentation

## 2017-06-14 DIAGNOSIS — F419 Anxiety disorder, unspecified: Secondary | ICD-10-CM | POA: Insufficient documentation

## 2017-06-14 DIAGNOSIS — Z79899 Other long term (current) drug therapy: Secondary | ICD-10-CM | POA: Diagnosis not present

## 2017-06-14 DIAGNOSIS — Z780 Asymptomatic menopausal state: Secondary | ICD-10-CM

## 2017-06-14 DIAGNOSIS — F329 Major depressive disorder, single episode, unspecified: Secondary | ICD-10-CM | POA: Diagnosis not present

## 2017-06-14 DIAGNOSIS — Z923 Personal history of irradiation: Secondary | ICD-10-CM | POA: Insufficient documentation

## 2017-06-14 DIAGNOSIS — K219 Gastro-esophageal reflux disease without esophagitis: Secondary | ICD-10-CM | POA: Insufficient documentation

## 2017-06-14 DIAGNOSIS — Z7982 Long term (current) use of aspirin: Secondary | ICD-10-CM | POA: Insufficient documentation

## 2017-06-14 DIAGNOSIS — I10 Essential (primary) hypertension: Secondary | ICD-10-CM | POA: Diagnosis not present

## 2017-06-14 DIAGNOSIS — Z87442 Personal history of urinary calculi: Secondary | ICD-10-CM | POA: Insufficient documentation

## 2017-06-14 DIAGNOSIS — C50012 Malignant neoplasm of nipple and areola, left female breast: Secondary | ICD-10-CM

## 2017-06-14 DIAGNOSIS — E039 Hypothyroidism, unspecified: Secondary | ICD-10-CM | POA: Diagnosis not present

## 2017-06-14 DIAGNOSIS — Z6841 Body Mass Index (BMI) 40.0 and over, adult: Secondary | ICD-10-CM | POA: Diagnosis not present

## 2017-06-14 LAB — COMPREHENSIVE METABOLIC PANEL
ALT: 14 U/L (ref 14–54)
AST: 17 U/L (ref 15–41)
Albumin: 4.4 g/dL (ref 3.5–5.0)
Alkaline Phosphatase: 71 U/L (ref 38–126)
Anion gap: 8 (ref 5–15)
BUN: 16 mg/dL (ref 6–20)
CO2: 32 mmol/L (ref 22–32)
Calcium: 10.1 mg/dL (ref 8.9–10.3)
Chloride: 98 mmol/L — ABNORMAL LOW (ref 101–111)
Creatinine, Ser: 0.8 mg/dL (ref 0.44–1.00)
GFR calc Af Amer: >60 mL/min (ref >60)
GFR calc non Af Amer: >60 mL/min (ref >60)
Glucose, Bld: 94 mg/dL (ref 65–99)
Potassium: 4.3 mmol/L (ref 3.5–5.1)
Sodium: 138 mmol/L (ref 135–145)
Total Bilirubin: 0.6 mg/dL (ref 0.3–1.2)
Total Protein: 7.1 g/dL (ref 6.5–8.1)

## 2017-06-14 LAB — CBC WITH DIFFERENTIAL/PLATELET
Basophils Absolute: 0 10*3/uL (ref 0.0–0.1)
Basophils Relative: 0 %
Eosinophils Absolute: 0.2 10*3/uL (ref 0.0–0.7)
Eosinophils Relative: 3 %
HCT: 42.6 % (ref 36.0–46.0)
Hemoglobin: 13.6 g/dL (ref 12.0–15.0)
LYMPHS PCT: 33 %
Lymphs Abs: 2.2 10*3/uL (ref 0.7–4.0)
MCH: 27.5 pg (ref 26.0–34.0)
MCHC: 31.9 g/dL (ref 30.0–36.0)
MCV: 86.1 fL (ref 78.0–100.0)
Monocytes Absolute: 0.4 10*3/uL (ref 0.1–1.0)
Monocytes Relative: 6 %
NEUTROS ABS: 3.8 10*3/uL (ref 1.7–7.7)
NEUTROS PCT: 58 %
PLATELETS: 218 10*3/uL (ref 150–400)
RBC: 4.95 MIL/uL (ref 3.87–5.11)
RDW: 13.8 % (ref 11.5–15.5)
WBC: 6.7 10*3/uL (ref 4.0–10.5)

## 2017-06-14 NOTE — Patient Instructions (Signed)
[facility] Discharge Instructions  Today you saw Dr. Higgs.    Thank you for choosing  Cancer Center at [facility] to provide your oncology and hematology care.  To afford each patient quality time with our provider, please arrive at least 15 minutes before your scheduled appointment time.   If you have a lab appointment with the Cancer Center please come in thru the  Main Entrance and check in at the main information desk  You need to re-schedule your appointment should you arrive 10 or more minutes late.  We strive to give you quality time with our providers, and arriving late affects you and other patients whose appointments are after yours.  Also, if you no show three or more times for appointments you may be dismissed from the clinic at the providers discretion.     Again, thank you for choosing [facility].  Our hope is that these requests will decrease the amount of time that you wait before being seen by our physicians.       _____________________________________________________________  Should you have questions after your visit to [facility], please contact our office at [phone] between the hours of 8:30 a.m. and 4:30 p.m.  Voicemails left after 4:30 p.m. will not be returned until the following business day.  For prescription refill requests, have your pharmacy contact our office.       Resources For Cancer Patients and their Caregivers ? American Cancer Society: Can assist with transportation, wigs, general needs, runs Look Good Feel Better.        1-888-227-6333 ? Cancer Care: Provides financial assistance, online support groups, medication/co-pay assistance.  1-800-813-HOPE (4673) ? [facility] Assists [place] cancer patients and their families through emotional , educational and financial support.  336-427-4357 ? [place] DSS Where to apply for  food stamps, Medicaid and utility assistance. 336-342-1394 ? [facility]: Transportation to medical appointments. 336-347-2287 ? Social Security Administration: May apply for disability if have a Stage IV cancer. 336-342-7796 1-800-772-1213 ? [place] Aging, Disability and Transit Services: Assists with nutrition, care and transit needs. 336-349-2343  Cancer Center Support Programs:   > Cancer Support Group  2nd Tuesday of the month 1pm-2pm, Journey Room   > Creative Journey  3rd Tuesday of the month 1130am-1pm, Journey Room    

## 2017-06-14 NOTE — Progress Notes (Signed)
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  67 yr old female diagnosed in 04/2015. Treated with lumpectomy, followed by re-excision, then bilateral breast reduction surgery. Post-op course was complicated by open right breast wounds requiring wound vac. Mammaprint genomic testing revealed low risk results (no benefit from chemotherapy).  She went on to complete adjuvant breast radiation in [place] on 12/15/15. Started anti-estrogen therapy with Anastrozole in 12/2015.   Bilateral diagnostic mammogram done 06/07/2017 showed surgical changes with no evidence of malignancy.  Targeted USN of the left breast showed scarring and fat necrosis/oil cysts.  She will have repeat bilateral diagnostic mammogram in 05/2018.    Labs reviewed with pt and were WNL.  She will RTC in 12/2017 for follow-up and to go over bone density.  RX for Arimidex sent to pharmacy.    2.  Bone health.  DEXA done scan 01/05/16 was normal.  She will be set up for repeat DEXA in 12/2017 for ongoing follow-up due to risk for osteoporosis with AI therapy.  Continue vitamin D supplementation.    3.  Health maintenance.  She reports she has lost weight intentionally through a weight loss program.  She is recommended to see GI for colonoscopy evaluation.    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  67 yr old female diagnosed in 04/2015. Treated with lumpectomy, followed by re-excision, then bilateral breast reduction surgery. Post-op course was complicated by open right  breast wounds requiring wound vac. Mammaprint genomic testing revealed low risk results (no benefit from chemotherapy).  She went on to complete adjuvant breast radiation in [place] on 12/15/15. Started anti-estrogen therapy with Anastrozole in 12/2015.   Bilateral diagnostic mammogram done 06/07/2017 showed surgical changes with no evidence of malignancy.  Targeted USN of the left breast showed scarring and fat necrosis/oil cysts.    Current Status:  Pt is seen today for follow-up.  She is here to go over labs and mammogram.  She denies any changes in the breast.      Breast cancer, left breast ([place])   05/05/2015 Procedure    Left needle core biopsy      05/05/2015 Pathology Results    Breast, left, needle core biopsy, 2:00 o'clock - INVASIVE AND IN SITU MAMMARY CARCINOMA WITH CALCIFICATIONS.      06/03/2015 Procedure    Left breast biopsy      06/03/2015 Pathology Results    Breast, left, needle core biopsy, central - ATYPICAL DUCTAL HYPERPLASIA.      06/17/2015 Pathology Results    Breast, partial mastectomy, Left, upper outer quadrant with seed and needle loc - MULTIFOCAL INVASIVE GRADE II LOBULAR CARCINOMA, TWO FOCI MEASURING 1.8 CM AND 1.7 CM IN GREATEST DIMENSION.      06/17/2015 Procedure    Left partial mastectomy by Dr. Judeth Horn      06/17/2015 Initial Diagnosis    Breast cancer ([place])      07/10/2015 Procedure    Left breast re-excision by Dr. Marla Roe      07/10/2015 Pathology Results    Breast, excision, Left Re-excision -  BENIGN BREAST TISSUE WITH BIOPSY CHANGES. - NO RESIDUAL TUMOR. - FINAL MARGINS CLEAR.Breast, left, needle core biopsy, outer - INVASIVE LOBULAR CARCINOMA.      08/01/2015 - 08/04/2015 Hospital Admission    Status post bilateral breast reduction  Status post partial mastectomy of left breast  Open wound of right breast  Open breast wound      08/11/2015 Miscellaneous    Mammaprint LOW RISK: No Potential Significant Chemotherapy Benefit       11/11/2015 - 12/15/2015 Radiation Therapy    Adjuvant radiation therapy; [place]; Left breast and regional LN to 50.4 GY/1.8 Gy          01/05/2016 Imaging    Bone density- BMD as determined from [facility] L1-L2 is 1.063 g/cm2 with a T-Score of -0.9. This patient is considered normal according to [facility]) criteria.      12/2015 -  Anti-estrogen oral therapy    Anastrozole daily         Problem List Patient Active Problem List   Diagnosis Date Noted  . History of reduction surgery of right breast [Z98.890] 08/01/2015  . Status post partial mastectomy of left breast [Z90.12] 08/01/2015  . Open wound of right breast [S21.001A] 08/01/2015  . Open breast wound [S21.009A] 08/01/2015  . Breast cancer, left breast ([place]) [C50.912] 06/17/2015  . Malignant neoplasm of upper-outer quadrant of left female breast ([place]) [C50.412] 06/11/2015  . Obesity, Class III, BMI 40-49.9 (morbid obesity) ([place]) [E66.01] 05/28/2015  . History of partial nephrectomy [Z90.5] 05/28/2015  . History of kidney stones [Z87.442] 05/28/2015  . Myopia of both eyes [H52.13] 12/10/2013  . 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 Nephrolithiasis [N20.0] 07/21/2011  . Hypertension [I10] 07/21/2011  . Acid reflux [K21.9] 07/21/2011    Past Medical History Past Medical History:  Diagnosis Date  . Anxiety   . Arthritis   . Breast cancer [facility]) oncologist-  shannon penland ([facility] and Dr Sondra Come    dx 04/ 2017-- left breast lobular multifocal carcinoma x2 sites-- Stage 2 w/ mets x1 positive node--  ER/PR+,  HER2 negative --  s/p  left breast lumpectomy with radioactive seed implants and re-excision for positive margin 06-21-2015  . Depression   . GERD (gastroesophageal reflux disease)   . History of kidney stones   . Hypertension   . Hypothyroidism, postsurgical   . Migraine   . Myopia of both eyes   .  Nephrolithiasis   . PONV (postoperative nausea and vomiting)   . Wears dentures   . Wears glasses   . Wound of right breast     Past Surgical History Past Surgical History:  Procedure Laterality Date  . APPLICATION OF A-CELL OF CHEST/ABDOMEN Right 10/30/2015   Procedure: APPLICATION OF A-CELL RIGHT BREAST WOUND;  Surgeon: Loel Lofty Dillingham, DO;  Location: [place];  Service: Plastics;  Laterality: Right;  . APPLICATION OF WOUND VAC Right 08/14/2015   Procedure: APPLICATION OF WOUND VAC;  Surgeon: Loel Lofty Dillingham, DO;  Location: [place];  Service: Plastics;  Laterality: Right;  . BREAST LUMPECTOMY WITH RADIOACTIVE SEED AND SENTINEL LYMPH NODE BIOPSY Left 06/17/2015   Procedure: LEFT BREAST LUMPECTOMY WITH RADIOACTIVE SEED AND SENTINEL LYMPH NODE BIOPSY, NEEDLE LOCALIZATION IN ATYPICAL DUCTAL HYPERPLASIA  AREA OF LEFT BREAST;  Surgeon: Judeth Horn, MD;  Location: [place];  Service: General;  Laterality: Left;  . BREAST RECONSTRUCTION Left 07/10/2015  Procedure: REVISION OF LEFT BREAST REDUCTION;  Surgeon: Loel Lofty Dillingham, DO;  Location: [place];  Service: Plastics;  Laterality: Left;  . BREAST REDUCTION SURGERY Bilateral 06/17/2015   Procedure: BILATERAL BREAST  REDUCTION  WITH RECONSTRUCTION  ON LEFT  AFTER PARTIAL MASTECTOMY;  Surgeon: Wallace Going, DO;  Location: [place];  Service: Plastics;  Laterality: Bilateral;  . CARPAL TUNNEL RELEASE Right x2  last one 2008  . CHOLECYSTECTOMY  2007  . CYSTO/  URETEROSCOPIC LASER LITHOTRIPSY / STONE EXTRACTION'S/  STENT PLACEMENT  x3  last one 2014  . INCISION AND DRAINAGE OF WOUND Right 08/14/2015   Procedure: Excision of right breast WOUND;  Surgeon: Loel Lofty Dillingham, DO;  Location: [place];  Service: Plastics;  Laterality: Right;  . INCISION AND DRAINAGE OF WOUND Right 09/22/2015   Procedure: IRRIGATION AND DEBRIDEMENT WOUND;  Surgeon: Wallace Going, DO;   Location: [place];  Service: Plastics;  Laterality: Right;  . INCISION AND DRAINAGE OF WOUND Right 10/30/2015   Procedure: IRRIGATION AND DEBRIDEMENT RIGHT BREAST WOUND;  Surgeon: Loel Lofty Dillingham, DO;  Location: [place];  Service: Plastics;  Laterality: Right;  . LUMBAR FUSION  2008   L1 -- L2  . MASS EXCISION Left 09/22/2015   Procedure: EXCISION LEFT BREAST MASS;  Surgeon: Wallace Going, DO;  Location: [place];  Service: Plastics;  Laterality: Left;  . NEGATIVE SLEEP STUDY  2007  (approx)  per pt  . PARTIAL NEPHRECTOMY Left 1972   "part of kidney rotted"  . RE-EXCISION OF BREAST LUMPECTOMY Left 07/10/2015   Procedure: RE-EXCISION OF LEFT BREAST LUMPECTOMY ;  Surgeon: Judeth Horn, MD;  Location: [place];  Service: General;  Laterality: Left;  . REDUCTION MAMMAPLASTY     bilaterally  . RETINAL DETACHMENT SURGERY Right 2015  [facility]   and Repair macular hole  . SHOULDER ARTHROSCOPY Right 2013  . THYROIDECTOMY Bilateral 2012    Family History Family History  Problem Relation Age of Onset  . Renal cancer Mother   . Breast cancer Sister      Social History  reports that she has never smoked. She has never used smokeless tobacco. She reports that she drinks alcohol. She reports that she does not use drugs.  Medications  Current Outpatient Medications:  .  anastrozole (ARIMIDEX) 1 MG tablet, Take 1 tablet (1 mg total) by mouth daily., Disp: 90 tablet, Rfl: 1 .  aspirin 81 MG chewable tablet, Chew 81 mg by mouth daily., Disp: , Rfl:  .  diazepam (VALIUM) 10 MG tablet, Take 10 mg by mouth 2 (two) times daily as needed. , Disp: , Rfl: 1 .  ergocalciferol (VITAMIN D2) 50000 units capsule, Take 1 capsule (50,000 Units total) by mouth once a week., Disp: 12 capsule, Rfl: 0 .  FLUoxetine (PROZAC) 20 MG capsule, Take 20 mg by mouth every morning., Disp: , Rfl: 3 .  levothyroxine (SYNTHROID, LEVOTHROID) 112 MCG tablet, Take by mouth., Disp: , Rfl:  .   metoprolol succinate (TOPROL-XL) 50 MG 24 hr tablet, Take 50 mg by mouth every morning. Take with or immediately following a meal., Disp: , Rfl:  .  oxyCODONE (OXY IR/ROXICODONE) 5 MG immediate release tablet, Take 1 tablet (5 mg total) by mouth every 4 (four) hours as needed., Disp: 60 tablet, Rfl: 0 .  pantoprazole (PROTONIX) 40 MG tablet, Take 40 mg by mouth every morning., Disp: , Rfl:  .  phenazopyridine (PYRIDIUM) 200  MG tablet, Take 200 mg by mouth every 8 (eight) hours as needed., Disp: , Rfl: 1 .  topiramate (TOPAMAX) 100 MG tablet, Take 100 mg by mouth at bedtime., Disp: , Rfl: 1  Allergies Bactrim [sulfamethoxazole-trimethoprim]; Vancomycin; and Codeine  Review of Systems Review of Systems - Oncology ROS as per HPI otherwise 12 point ROS is negative.   Physical Exam  Vitals Wt Readings from Last 3 Encounters:  06/14/17 198 lb 3.2 oz (89.9 kg)  12/31/16 212 lb 8 oz (96.4 kg)  08/10/16 252 lb (114.3 kg)   Temp Readings from Last 3 Encounters:  06/14/17 97.8 F (36.6 C) (Oral)  12/31/16 98.2 F (36.8 C) (Oral)  05/11/16 98 F (36.7 C) (Oral)   BP Readings from Last 3 Encounters:  06/14/17 (!) 153/69  12/31/16 (!) 147/63  08/10/16 122/77   Pulse Readings from Last 3 Encounters:  06/14/17 (!) 56  12/31/16 65  08/10/16 61   Constitutional: Well-developed, well-nourished, and in no distress.   HENT: Head: Normocephalic and atraumatic.  Mouth/Throat: No oropharyngeal exudate. Mucosa moist. Eyes: Pupils are equal, round, and reactive to light. Conjunctivae are normal. No scleral icterus.  Neck: Normal range of motion. Neck supple. No JVD present.  Cardiovascular: Normal rate, regular rhythm and normal heart sounds.  Exam reveals no gallop and no friction rub.   No murmur heard. Pulmonary/Chest: Effort normal and breath sounds normal. No respiratory distress. No wheezes.No rales.  Abdominal: Soft. Bowel sounds are normal. No distension. There is no tenderness. There  is no guarding.  Musculoskeletal: No edema or tenderness.  Lymphadenopathy: No cervical, axillary or supraclavicular adenopathy.  Neurological: Alert and oriented to person, place, and time. No cranial nerve deficit.  Skin: Skin is warm and dry. No rash noted. No erythema. No pallor.  Psychiatric: Affect and judgment normal.  Bilateral breast exam:  Chaperone present.  Surgical scarring noted bilaterally.  Nodular areas noted bilaterally likely due to surgical scarring.    Labs Appointment on 06/14/2017  Component Date Value Ref Range Status  . WBC 06/14/2017 6.7  4.0 - 10.5 K/uL Final  . RBC 06/14/2017 4.95  3.87 - 5.11 MIL/uL Final  . Hemoglobin 06/14/2017 13.6  12.0 - 15.0 g/dL Final  . HCT 06/14/2017 42.6  36.0 - 46.0 % Final  . MCV 06/14/2017 86.1  78.0 - 100.0 fL Final  . MCH 06/14/2017 27.5  26.0 - 34.0 pg Final  . MCHC 06/14/2017 31.9  30.0 - 36.0 g/dL Final  . RDW 06/14/2017 13.8  11.5 - 15.5 % Final  . Platelets 06/14/2017 218  150 - 400 K/uL Final  . Neutrophils Relative % 06/14/2017 58  % Final  . Neutro Abs 06/14/2017 3.8  1.7 - 7.7 K/uL Final  . Lymphocytes Relative 06/14/2017 33  % Final  . Lymphs Abs 06/14/2017 2.2  0.7 - 4.0 K/uL Final  . Monocytes Relative 06/14/2017 6  % Final  . Monocytes Absolute 06/14/2017 0.4  0.1 - 1.0 K/uL Final  . Eosinophils Relative 06/14/2017 3  % Final  . Eosinophils Absolute 06/14/2017 0.2  0.0 - 0.7 K/uL Final  . Basophils Relative 06/14/2017 0  % Final  . Basophils Absolute 06/14/2017 0.0  0.0 - 0.1 K/uL Final   Performed at [facility], [address] [place], [place] [address]  . Sodium 06/14/2017 138  135 - 145 mmol/L Final  . Potassium 06/14/2017 4.3  3.5 - 5.1 mmol/L Final  . Chloride 06/14/2017 98* 101 - 111 mmol/L Final  .  CO2 06/14/2017 32  22 - 32 mmol/L Final  . Glucose, Bld 06/14/2017 94  65 - 99 mg/dL Final  . BUN 06/14/2017 16  6 - 20 mg/dL Final  . Creatinine, Ser 06/14/2017 0.80  0.44 - 1.00 mg/dL Final  . Calcium  06/14/2017 10.1  8.9 - 10.3 mg/dL Final  . Total Protein 06/14/2017 7.1  6.5 - 8.1 g/dL Final  . Albumin 06/14/2017 4.4  3.5 - 5.0 g/dL Final  . AST 06/14/2017 17  15 - 41 U/L Final  . ALT 06/14/2017 14  14 - 54 U/L Final  . Alkaline Phosphatase 06/14/2017 71  38 - 126 U/L Final  . Total Bilirubin 06/14/2017 0.6  0.3 - 1.2 mg/dL Final  . GFR calc non Af Amer 06/14/2017 >60  >60 mL/min Final  . GFR calc Af Amer 06/14/2017 >60  >60 mL/min Final   Comment: (NOTE) The eGFR has been calculated using the CKD EPI equation. This calculation has not been validated in all clinical situations. eGFR's persistently <60 mL/min signify possible Chronic Kidney Disease.   Georgiann Hahn gap 06/14/2017 8  5 - 15 Final   Performed at [facility], [address], [place], [place] [address]     Pathology Orders Placed This Encounter  Procedures  . DG Bone Density    Standing Status:   Future    Standing Expiration Date:   06/15/2018    Order Specific Question:   Reason for Exam (SYMPTOM  OR DIAGNOSIS REQUIRED)    Answer:   postmenopausal breast cancer    Order Specific Question:   Preferred imaging location?    Answer:   [facility]       Zoila Shutter MD

## 2017-09-22 ENCOUNTER — Other Ambulatory Visit (HOSPITAL_COMMUNITY): Payer: Self-pay | Admitting: *Deleted

## 2017-09-22 DIAGNOSIS — Z170 Estrogen receptor positive status [ER+]: Principal | ICD-10-CM

## 2017-09-22 DIAGNOSIS — C50412 Malignant neoplasm of upper-outer quadrant of left female breast: Secondary | ICD-10-CM

## 2017-09-22 MED ORDER — ANASTROZOLE 1 MG PO TABS: 1.0000 mg | ORAL_TABLET | Freq: Every day | ORAL | 1 refills | Status: DC

## 2017-09-22 NOTE — Telephone Encounter (Signed)
Chart reviewed, anastrozole refilled. 

## 2018-01-12 ENCOUNTER — Inpatient Hospital Stay (HOSPITAL_COMMUNITY): Admission: RE | Admit: 2018-01-12 | Payer: Medicare Other | Source: Ambulatory Visit

## 2018-01-16 ENCOUNTER — Other Ambulatory Visit: Payer: Self-pay

## 2018-01-16 ENCOUNTER — Inpatient Hospital Stay (HOSPITAL_COMMUNITY): Payer: Medicare Other | Attending: Hematology | Admitting: Internal Medicine

## 2018-01-16 VITALS — BP 159/87 | HR 53 | Temp 98.0°F | Resp 14 | Wt 228.6 lb

## 2018-01-16 DIAGNOSIS — Z79811 Long term (current) use of aromatase inhibitors: Secondary | ICD-10-CM

## 2018-01-16 DIAGNOSIS — I10 Essential (primary) hypertension: Secondary | ICD-10-CM | POA: Diagnosis not present

## 2018-01-16 DIAGNOSIS — Z170 Estrogen receptor positive status [ER+]: Secondary | ICD-10-CM | POA: Diagnosis not present

## 2018-01-16 DIAGNOSIS — Z7982 Long term (current) use of aspirin: Secondary | ICD-10-CM | POA: Insufficient documentation

## 2018-01-16 DIAGNOSIS — Z923 Personal history of irradiation: Secondary | ICD-10-CM | POA: Diagnosis not present

## 2018-01-16 DIAGNOSIS — Z905 Acquired absence of kidney: Secondary | ICD-10-CM | POA: Diagnosis not present

## 2018-01-16 DIAGNOSIS — Z79899 Other long term (current) drug therapy: Secondary | ICD-10-CM | POA: Diagnosis not present

## 2018-01-16 DIAGNOSIS — C50412 Malignant neoplasm of upper-outer quadrant of left female breast: Secondary | ICD-10-CM | POA: Insufficient documentation

## 2018-01-16 DIAGNOSIS — Z9012 Acquired absence of left breast and nipple: Secondary | ICD-10-CM

## 2018-01-16 DIAGNOSIS — Z780 Asymptomatic menopausal state: Secondary | ICD-10-CM

## 2018-01-16 MED ORDER — ANASTROZOLE 1 MG PO TABS: 1.0000 mg | ORAL_TABLET | Freq: Every day | ORAL | 3 refills | Status: DC

## 2018-01-16 NOTE — Progress Notes (Signed)
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  67 yr old female diagnosed in 04/2015. Treated with lumpectomy, followed by re-excision, then bilateral breast reduction surgery. Post-op course was complicated by open right breast wounds requiring wound vac. Mammaprint genomic testing revealed low risk results (no benefit from chemotherapy).  She went on to complete adjuvant breast radiation in [place] on 12/15/15. Started anti-estrogen therapy with Anastrozole in 12/2015.   Bilateral diagnostic mammogram done 06/07/2017 showed surgical changes with no evidence of malignancy.  Targeted USN of the left breast showed scarring and fat necrosis/oil cysts.  Pt set up for  bilateral diagnostic mammogram in 05/2018 and will RTC at that time with labs and to go over results.  Pt did not do bone density so test is reordered.   RX for Arimidex # 90 with 3 refills sent to pharmacy.    2.  Bone health.  DEXA done scan 01/05/16 was normal.  Pt was set up for DEXA in 12/2017.  She did not have study done.  Dexa reordered.    3.  Retinal detachments.  She reports she has undergone various eye procedures.  Follow-up with ophthalmology as recommended.    25 minutes spent with more than 50% spent in counseling and coordination of care.    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  67 yr old female diagnosed in 04/2015. Treated with  lumpectomy, followed by re-excision, then bilateral breast reduction surgery. Post-op course was complicated by open right breast wounds requiring wound vac. Mammaprint genomic testing revealed low risk results (no benefit from chemotherapy).  She went on to complete adjuvant breast radiation in [place] on 12/15/15. Started anti-estrogen therapy with Anastrozole in 12/2015.   Bilateral diagnostic mammogram done 06/07/2017 showed surgical changes with no evidence of malignancy.  Targeted USN of the left breast showed scarring and fat necrosis/oil cysts.    Current Status:  Pt is seen today for follow-up.  She has not had bone density.  She reports she was diagnosed with retinal detachments and has undergone various procedures.     Breast cancer, left breast ([place])   05/05/2015 Procedure    Left needle core biopsy    05/05/2015 Pathology Results    Breast, left, needle core biopsy, 2:00 o'clock - INVASIVE AND IN SITU MAMMARY CARCINOMA WITH CALCIFICATIONS.    06/03/2015 Procedure    Left breast biopsy    06/03/2015 Pathology Results    Breast, left, needle core biopsy, central - ATYPICAL DUCTAL HYPERPLASIA.    06/17/2015 Pathology Results    Breast, partial mastectomy, Left, upper outer quadrant with seed and needle loc - MULTIFOCAL INVASIVE GRADE II LOBULAR CARCINOMA, TWO FOCI MEASURING 1.8 CM AND 1.7 CM IN GREATEST DIMENSION.    06/17/2015 Procedure    Left partial mastectomy by Dr. Judeth Horn    06/17/2015 Initial Diagnosis    Breast cancer ([place])    07/10/2015 Procedure    Left breast re-excision by Dr. Marla Roe    07/10/2015 Pathology Results  Breast, excision, Left Re-excision - BENIGN BREAST TISSUE WITH BIOPSY CHANGES. - NO RESIDUAL TUMOR. - FINAL MARGINS CLEAR.Breast, left, needle core biopsy, outer - INVASIVE LOBULAR CARCINOMA.    08/01/2015 - 08/04/2015 Hospital Admission    Status post bilateral breast reduction  Status post partial mastectomy of left breast  Open wound of right breast   Open breast wound    08/11/2015 Miscellaneous    Mammaprint LOW RISK: No Potential Significant Chemotherapy Benefit    11/11/2015 - 12/15/2015 Radiation Therapy    Adjuvant radiation therapy; [place]; Left breast and regional LN to 50.4 GY/1.8 Gy        01/05/2016 Imaging    Bone density- BMD as determined from [facility] L1-L2 is 1.063 g/cm2 with a T-Score of -0.9. This patient is considered normal according to [facility]) criteria.    12/2015 -  Anti-estrogen oral therapy    Anastrozole daily       Problem List Patient Active Problem List   Diagnosis Date Noted  . History of reduction surgery of right breast [Z98.890] 08/01/2015  . Status post partial mastectomy of left breast [Z90.12] 08/01/2015  . Open wound of right breast [S21.001A] 08/01/2015  . Open breast wound [S21.009A] 08/01/2015  . Breast cancer, left breast ([place]) [C50.912] 06/17/2015  . Malignant neoplasm of upper-outer quadrant of left female breast ([place]) [C50.412] 06/11/2015  . Obesity, Class III, BMI 40-49.9 (morbid obesity) ([place]) [E66.01] 05/28/2015  . History of partial nephrectomy [Z90.5] 05/28/2015  . History of kidney stones [Z87.442] 05/28/2015  . Myopia of both eyes [H52.13] 12/10/2013  . 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 Nephrolithiasis [N20.0] 07/21/2011  . Hypertension [I10] 07/21/2011  . Acid reflux [K21.9] 07/21/2011    Past Medical History Past Medical History:  Diagnosis Date  . Anxiety   . Arthritis   . Breast cancer [facility]) oncologist-  shannon penland ([facility] and Dr Sondra Come    dx 04/ 2017-- left breast lobular multifocal carcinoma x2 sites-- Stage 2 w/ mets x1 positive node--  ER/PR+,  HER2 negative --  s/p  left breast lumpectomy with radioactive seed implants and re-excision for positive margin 06-21-2015  . Depression   . GERD (gastroesophageal reflux disease)   .  History of kidney stones   . Hypertension   . Hypothyroidism, postsurgical   . Migraine   . Myopia of both eyes   . Nephrolithiasis   . PONV (postoperative nausea and vomiting)   . Wears dentures   . Wears glasses   . Wound of right breast     Past Surgical History Past Surgical History:  Procedure Laterality Date  . APPLICATION OF A-CELL OF CHEST/ABDOMEN Right 10/30/2015   Procedure: APPLICATION OF A-CELL RIGHT BREAST WOUND;  Surgeon: Loel Lofty Dillingham, DO;  Location: [place];  Service: Plastics;  Laterality: Right;  . APPLICATION OF WOUND VAC Right 08/14/2015   Procedure: APPLICATION OF WOUND VAC;  Surgeon: Loel Lofty Dillingham, DO;  Location: [place];  Service: Plastics;  Laterality: Right;  . BREAST LUMPECTOMY WITH RADIOACTIVE SEED AND SENTINEL LYMPH NODE BIOPSY Left 06/17/2015   Procedure: LEFT BREAST LUMPECTOMY WITH RADIOACTIVE SEED AND SENTINEL LYMPH NODE BIOPSY, NEEDLE LOCALIZATION IN ATYPICAL DUCTAL HYPERPLASIA  AREA OF LEFT BREAST;  Surgeon: Judeth Horn, MD;  Location: [place];  Service: General;  Laterality: Left;  . BREAST RECONSTRUCTION Left 07/10/2015   Procedure: REVISION OF LEFT BREAST REDUCTION;  Surgeon: Loel Lofty Dillingham, DO;  Location: [place];  Service: Plastics;  Laterality: Left;  . BREAST REDUCTION SURGERY Bilateral 06/17/2015   Procedure: BILATERAL BREAST  REDUCTION  WITH RECONSTRUCTION  ON LEFT  AFTER PARTIAL MASTECTOMY;  Surgeon: Wallace Going, DO;  Location: [place];  Service: Plastics;  Laterality: Bilateral;  . CARPAL TUNNEL RELEASE Right x2  last one 2008  . CHOLECYSTECTOMY  2007  . CYSTO/  URETEROSCOPIC LASER LITHOTRIPSY / STONE EXTRACTION'S/  STENT PLACEMENT  x3  last one 2014  . INCISION AND DRAINAGE OF WOUND Right 08/14/2015   Procedure: Excision of right breast WOUND;  Surgeon: Loel Lofty Dillingham, DO;  Location: [place];  Service: Plastics;  Laterality: Right;  . INCISION AND  DRAINAGE OF WOUND Right 09/22/2015   Procedure: IRRIGATION AND DEBRIDEMENT WOUND;  Surgeon: Wallace Going, DO;  Location: [place];  Service: Plastics;  Laterality: Right;  . INCISION AND DRAINAGE OF WOUND Right 10/30/2015   Procedure: IRRIGATION AND DEBRIDEMENT RIGHT BREAST WOUND;  Surgeon: Loel Lofty Dillingham, DO;  Location: [place];  Service: Plastics;  Laterality: Right;  . LUMBAR FUSION  2008   L1 -- L2  . MASS EXCISION Left 09/22/2015   Procedure: EXCISION LEFT BREAST MASS;  Surgeon: Wallace Going, DO;  Location: [place];  Service: Plastics;  Laterality: Left;  . NEGATIVE SLEEP STUDY  2007  (approx)  per pt  . PARTIAL NEPHRECTOMY Left 1972   "part of kidney rotted"  . RE-EXCISION OF BREAST LUMPECTOMY Left 07/10/2015   Procedure: RE-EXCISION OF LEFT BREAST LUMPECTOMY ;  Surgeon: Judeth Horn, MD;  Location: [place];  Service: General;  Laterality: Left;  . REDUCTION MAMMAPLASTY     bilaterally  . RETINAL DETACHMENT SURGERY Right 2015  [facility]   and Repair macular hole  . SHOULDER ARTHROSCOPY Right 2013  . THYROIDECTOMY Bilateral 2012    Family History Family History  Problem Relation Age of Onset  . Renal cancer Mother   . Breast cancer Sister      Social History  reports that she has never smoked. She has never used smokeless tobacco. She reports current alcohol use. She reports that she does not use drugs.  Medications  Current Outpatient Medications:  .  dorzolamide-timolol (COSOPT) 22.3-6.8 MG/ML ophthalmic solution, Apply to eye., Disp: , Rfl:  .  erythromycin ophthalmic ointment, Place 1 application into the left eye at bedtime., Disp: , Rfl:  .  anastrozole (ARIMIDEX) 1 MG tablet, Take 1 tablet (1 mg total) by mouth daily., Disp: 90 tablet, Rfl: 1 .  aspirin 81 MG chewable tablet, Chew 81 mg by mouth daily., Disp: , Rfl:  .  diazepam (VALIUM) 10 MG tablet, Take 10 mg by mouth 2 (two) times daily as needed. , Disp: , Rfl: 1 .   ergocalciferol (VITAMIN D2) 50000 units capsule, Take 1 capsule (50,000 Units total) by mouth once a week., Disp: 12 capsule, Rfl: 0 .  FLUoxetine (PROZAC) 20 MG capsule, Take 20 mg by mouth every morning., Disp: , Rfl: 3 .  levothyroxine (SYNTHROID, LEVOTHROID) 112 MCG tablet, Take by mouth., Disp: , Rfl:  .  oxyCODONE (OXY IR/ROXICODONE) 5 MG immediate release tablet, Take 1 tablet (5 mg total) by mouth every 4 (four) hours as needed., Disp: 60 tablet, Rfl: 0 .  pantoprazole (PROTONIX) 40 MG tablet, Take 40 mg by mouth every morning., Disp: , Rfl:  .  phenazopyridine (PYRIDIUM) 200 MG tablet, Take  200 mg by mouth every 8 (eight) hours as needed., Disp: , Rfl: 1 .  prednisoLONE acetate (PRED FORTE) 1 % ophthalmic suspension, Apply to eye., Disp: , Rfl:   Allergies Sulfamethoxazole-trimethoprim; Vancomycin; and Codeine  Review of Systems Review of Systems - Oncology ROS negative other than retinal detachment.   Physical Exam  Vitals Wt Readings from Last 3 Encounters:  01/16/18 228 lb 9.6 oz (103.7 kg)  06/14/17 198 lb 3.2 oz (89.9 kg)  12/31/16 212 lb 8 oz (96.4 kg)   Temp Readings from Last 3 Encounters:  01/16/18 98 F (36.7 C) (Oral)  06/14/17 97.8 F (36.6 C) (Oral)  12/31/16 98.2 F (36.8 C) (Oral)   BP Readings from Last 3 Encounters:  01/16/18 (!) 159/87  06/14/17 (!) 153/69  12/31/16 (!) 147/63   Pulse Readings from Last 3 Encounters:  01/16/18 (!) 53  06/14/17 (!) 56  12/31/16 65   Constitutional: Well-developed, well-nourished, and in no distress.   HENT: Head: Normocephalic and atraumatic.  Mouth/Throat: No oropharyngeal exudate. Mucosa moist. Eyes: Pupils are equal, round, and reactive to light. Conjunctivae are normal. No scleral icterus.  Neck: Normal range of motion. Neck supple. No JVD present.  Cardiovascular: Normal rate, regular rhythm and normal heart sounds.  Exam reveals no gallop and no friction rub.   No murmur heard. Pulmonary/Chest: Effort  normal and breath sounds normal. No respiratory distress. No wheezes.No rales.  Abdominal: Soft. Bowel sounds are normal. No distension. There is no tenderness. There is no guarding.  Musculoskeletal: No edema or tenderness.  Lymphadenopathy: No cervical, axillary or supraclavicular adenopathy.  Neurological: Alert and oriented to person, place, and time. No cranial nerve deficit.  Skin: Skin is warm and dry. No rash noted. No erythema. No pallor.  Psychiatric: Affect and judgment normal.  Breast exam:  Chaperone present.  Evidence of breast surgery with scars noted bilaterally.  Areas palpable in both breast likely related to scarring.    Labs No visits with results within 3 Day(s) from this visit.  Latest known visit with results is:  Appointment on 06/14/2017  Component Date Value Ref Range Status  . WBC 06/14/2017 6.7  4.0 - 10.5 K/uL Final  . RBC 06/14/2017 4.95  3.87 - 5.11 MIL/uL Final  . Hemoglobin 06/14/2017 13.6  12.0 - 15.0 g/dL Final  . HCT 06/14/2017 42.6  36.0 - 46.0 % Final  . MCV 06/14/2017 86.1  78.0 - 100.0 fL Final  . MCH 06/14/2017 27.5  26.0 - 34.0 pg Final  . MCHC 06/14/2017 31.9  30.0 - 36.0 g/dL Final  . RDW 06/14/2017 13.8  11.5 - 15.5 % Final  . Platelets 06/14/2017 218  150 - 400 K/uL Final  . Neutrophils Relative % 06/14/2017 58  % Final  . Neutro Abs 06/14/2017 3.8  1.7 - 7.7 K/uL Final  . Lymphocytes Relative 06/14/2017 33  % Final  . Lymphs Abs 06/14/2017 2.2  0.7 - 4.0 K/uL Final  . Monocytes Relative 06/14/2017 6  % Final  . Monocytes Absolute 06/14/2017 0.4  0.1 - 1.0 K/uL Final  . Eosinophils Relative 06/14/2017 3  % Final  . Eosinophils Absolute 06/14/2017 0.2  0.0 - 0.7 K/uL Final  . Basophils Relative 06/14/2017 0  % Final  . Basophils Absolute 06/14/2017 0.0  0.0 - 0.1 K/uL Final   Performed at [facility], [address], [place], [place] [address]  . Sodium 06/14/2017 138  135 - 145 mmol/L Final  . Potassium 06/14/2017 4.3  3.5 - 5.1 mmol/L  Final  . Chloride 06/14/2017 98* 101 - 111 mmol/L Final  . CO2 06/14/2017 32  22 - 32 mmol/L Final  . Glucose, Bld 06/14/2017 94  65 - 99 mg/dL Final  . BUN 06/14/2017 16  6 - 20 mg/dL Final  . Creatinine, Ser 06/14/2017 0.80  0.44 - 1.00 mg/dL Final  . Calcium 06/14/2017 10.1  8.9 - 10.3 mg/dL Final  . Total Protein 06/14/2017 7.1  6.5 - 8.1 g/dL Final  . Albumin 06/14/2017 4.4  3.5 - 5.0 g/dL Final  . AST 06/14/2017 17  15 - 41 U/L Final  . ALT 06/14/2017 14  14 - 54 U/L Final  . Alkaline Phosphatase 06/14/2017 71  38 - 126 U/L Final  . Total Bilirubin 06/14/2017 0.6  0.3 - 1.2 mg/dL Final  . GFR calc non Af Amer 06/14/2017 >60  >60 mL/min Final  . GFR calc Af Amer 06/14/2017 >60  >60 mL/min Final   Comment: (NOTE) The eGFR has been calculated using the CKD EPI equation. This calculation has not been validated in all clinical situations. eGFR's persistently <60 mL/min signify possible Chronic Kidney Disease.   Georgiann Hahn gap 06/14/2017 8  5 - 15 Final   Performed at [facility], [address] [place], [place] [address]     Pathology Orders Placed This Encounter  Procedures  . MM Digital Diagnostic Bilat    Standing Status:   Future    Standing Expiration Date:   01/16/2019    Order Specific Question:   Reason for Exam (SYMPTOM  OR DIAGNOSIS REQUIRED)    Answer:   left breast cancer    Order Specific Question:   Preferred imaging location?    Answer:   [facility]    Standing Status:   Future    Standing Expiration Date:   01/16/2019    Order Specific Question:   Reason for Exam (SYMPTOM  OR DIAGNOSIS REQUIRED)    Answer:   postmenopausal    Order Specific Question:   Preferred imaging location?    Answer:   [facility]  . CBC with Differential/Platelet    Standing Status:   Future    Standing Expiration Date:   01/17/2019  . Comprehensive metabolic panel    Standing Status:   Future    Standing Expiration Date:   01/17/2019  .  Lactate dehydrogenase    Standing Status:   Future    Standing Expiration Date:   01/17/2019       Zoila Shutter MD

## 2018-01-16 NOTE — Patient Instructions (Signed)
[facility]  Discharge Instructions: You saw Dr. Higgs today                               _______________________________________________________________  Thank you for choosing [facility] to provide your oncology and hematology care.  To afford each patient quality time with our providers, please arrive at least 15 minutes before your scheduled appointment.  You need to re-schedule your appointment if you arrive 10 or more minutes late.  We strive to give you quality time with our providers, and arriving late affects you and other patients whose appointments are after yours.  Also, if you no show three or more times for appointments you may be dismissed from the clinic.  Again, thank you for choosing  [facility] at [facility]. Our hope is that these requests will allow you access to exceptional care and in a timely manner. _______________________________________________________________  If you have questions after your visit, please contact our office at [phone] between the hours of 8:30 a.m. and 5:00 p.m. Voicemails left after 4:30 p.m. will not be returned until the following business day. _______________________________________________________________  For prescription refill requests, have your pharmacy contact our office. _______________________________________________________________  Recommendations made by the consultant and any test results will be sent to your referring physician. _______________________________________________________________ 

## 2018-05-10 ENCOUNTER — Telehealth (HOSPITAL_COMMUNITY): Payer: Self-pay | Admitting: *Deleted

## 2018-05-10 NOTE — Telephone Encounter (Signed)
Pt called about needing new prosthetic bras. Will get provider to write prescription and prescription will be mailed out to patient.

## 2018-06-07 ENCOUNTER — Other Ambulatory Visit (HOSPITAL_COMMUNITY): Payer: Self-pay | Admitting: Internal Medicine

## 2018-06-07 DIAGNOSIS — R928 Other abnormal and inconclusive findings on diagnostic imaging of breast: Secondary | ICD-10-CM

## 2018-06-12 ENCOUNTER — Other Ambulatory Visit (HOSPITAL_COMMUNITY): Payer: Self-pay | Admitting: *Deleted

## 2018-06-12 DIAGNOSIS — N641 Fat necrosis of breast: Secondary | ICD-10-CM

## 2018-06-12 DIAGNOSIS — Z9889 Other specified postprocedural states: Secondary | ICD-10-CM

## 2018-06-13 ENCOUNTER — Inpatient Hospital Stay (HOSPITAL_COMMUNITY): Payer: Medicare Other | Attending: Hematology

## 2018-06-13 ENCOUNTER — Other Ambulatory Visit: Payer: Self-pay

## 2018-06-13 ENCOUNTER — Inpatient Hospital Stay (HOSPITAL_COMMUNITY): Admission: RE | Admit: 2018-06-13 | Payer: Medicare Other | Source: Ambulatory Visit

## 2018-06-13 ENCOUNTER — Ambulatory Visit (HOSPITAL_COMMUNITY)
Admission: RE | Admit: 2018-06-13 | Discharge: 2018-06-13 | Disposition: A | Discharge status: Home / Self Care | Payer: Medicare Other | Source: Ambulatory Visit | Attending: Internal Medicine | Admitting: Internal Medicine

## 2018-06-13 ENCOUNTER — Ambulatory Visit (HOSPITAL_COMMUNITY): Admission: RE | Admit: 2018-06-13 | Payer: Medicare Other | Source: Ambulatory Visit

## 2018-06-13 ENCOUNTER — Other Ambulatory Visit (HOSPITAL_COMMUNITY): Payer: Self-pay | Admitting: Internal Medicine

## 2018-06-13 DIAGNOSIS — C50412 Malignant neoplasm of upper-outer quadrant of left female breast: Secondary | ICD-10-CM | POA: Insufficient documentation

## 2018-06-13 DIAGNOSIS — Z170 Estrogen receptor positive status [ER+]: Secondary | ICD-10-CM | POA: Insufficient documentation

## 2018-06-13 DIAGNOSIS — R928 Other abnormal and inconclusive findings on diagnostic imaging of breast: Secondary | ICD-10-CM

## 2018-06-13 DIAGNOSIS — Z9889 Other specified postprocedural states: Secondary | ICD-10-CM

## 2018-06-13 DIAGNOSIS — I10 Essential (primary) hypertension: Secondary | ICD-10-CM | POA: Diagnosis not present

## 2018-06-13 DIAGNOSIS — N641 Fat necrosis of breast: Secondary | ICD-10-CM | POA: Diagnosis not present

## 2018-06-13 LAB — CBC WITH DIFFERENTIAL/PLATELET
Abs Immature Granulocytes: 0.09 10*3/uL — ABNORMAL HIGH (ref 0.00–0.07)
Basophils Absolute: 0 10*3/uL (ref 0.0–0.1)
Basophils Relative: 0 %
Eosinophils Absolute: 0.7 10*3/uL — ABNORMAL HIGH (ref 0.0–0.5)
Eosinophils Relative: 8 %
HCT: 39.9 % (ref 36.0–46.0)
Hemoglobin: 12.1 g/dL (ref 12.0–15.0)
Immature Granulocytes: 1 %
Lymphocytes Relative: 26 %
Lymphs Abs: 2.1 10*3/uL (ref 0.7–4.0)
MCH: 26.9 pg (ref 26.0–34.0)
MCHC: 30.3 g/dL (ref 30.0–36.0)
MCV: 88.7 fL (ref 80.0–100.0)
Monocytes Absolute: 0.5 10*3/uL (ref 0.1–1.0)
Monocytes Relative: 6 %
Neutro Abs: 4.7 10*3/uL (ref 1.7–7.7)
Neutrophils Relative %: 59 %
Platelets: 271 10*3/uL (ref 150–400)
RBC: 4.5 MIL/uL (ref 3.87–5.11)
RDW: 12.9 % (ref 11.5–15.5)
WBC: 8 10*3/uL (ref 4.0–10.5)
nRBC: 0 % (ref 0.0–0.2)

## 2018-06-13 LAB — COMPREHENSIVE METABOLIC PANEL
ALT: 13 U/L (ref 0–44)
AST: 17 U/L (ref 15–41)
Albumin: 3.9 g/dL (ref 3.5–5.0)
Alkaline Phosphatase: 69 U/L (ref 38–126)
Anion gap: 10 (ref 5–15)
BUN: 14 mg/dL (ref 8–23)
CO2: 29 mmol/L (ref 22–32)
Calcium: 9.5 mg/dL (ref 8.9–10.3)
Chloride: 101 mmol/L (ref 98–111)
Creatinine, Ser: 0.81 mg/dL (ref 0.44–1.00)
GFR calc Af Amer: >60 mL/min (ref >60)
GFR calc non Af Amer: >60 mL/min (ref >60)
Glucose, Bld: 120 mg/dL — ABNORMAL HIGH (ref 70–99)
Potassium: 4.2 mmol/L (ref 3.5–5.1)
Sodium: 140 mmol/L (ref 135–145)
Total Bilirubin: 0.2 mg/dL — ABNORMAL LOW (ref 0.3–1.2)
Total Protein: 6.8 g/dL (ref 6.5–8.1)

## 2018-06-13 LAB — LACTATE DEHYDROGENASE: LDH: 129 U/L (ref 98–192)

## 2018-06-14 NOTE — Progress Notes (Signed)
For review.  Please schedule with Dr. Raliegh Ip to discuss reported mass in breast with biopsy recommended.  Please update ordering provided

## 2018-06-20 ENCOUNTER — Other Ambulatory Visit (HOSPITAL_COMMUNITY): Payer: Self-pay | Admitting: Family Medicine

## 2018-06-20 ENCOUNTER — Ambulatory Visit (HOSPITAL_COMMUNITY): Payer: Medicare Other | Admitting: Hematology

## 2018-06-20 ENCOUNTER — Ambulatory Visit (HOSPITAL_COMMUNITY)
Admission: RE | Admit: 2018-06-20 | Discharge: 2018-06-20 | Disposition: A | Discharge status: Home / Self Care | Payer: Medicare Other | Source: Ambulatory Visit | Attending: Internal Medicine | Admitting: Internal Medicine

## 2018-06-20 ENCOUNTER — Ambulatory Visit (HOSPITAL_COMMUNITY)
Admission: RE | Admit: 2018-06-20 | Discharge: 2018-06-20 | Disposition: A | Discharge status: Home / Self Care | Payer: Medicare Other | Source: Ambulatory Visit | Attending: Hematology | Admitting: Hematology

## 2018-06-20 ENCOUNTER — Other Ambulatory Visit: Payer: Self-pay

## 2018-06-20 ENCOUNTER — Ambulatory Visit (HOSPITAL_COMMUNITY)
Admission: RE | Admit: 2018-06-20 | Discharge: 2018-06-20 | Disposition: A | Discharge status: Home / Self Care | Payer: Medicare Other | Source: Ambulatory Visit | Attending: Family Medicine | Admitting: Family Medicine

## 2018-06-20 DIAGNOSIS — Z780 Asymptomatic menopausal state: Secondary | ICD-10-CM | POA: Insufficient documentation

## 2018-06-20 DIAGNOSIS — R928 Other abnormal and inconclusive findings on diagnostic imaging of breast: Secondary | ICD-10-CM

## 2018-06-20 DIAGNOSIS — C50412 Malignant neoplasm of upper-outer quadrant of left female breast: Secondary | ICD-10-CM

## 2018-06-20 DIAGNOSIS — Z170 Estrogen receptor positive status [ER+]: Secondary | ICD-10-CM | POA: Diagnosis present

## 2018-06-20 MED ORDER — LIDOCAINE-EPINEPHRINE (PF) 1 %-1:200000 IJ SOLN
INTRAMUSCULAR | Status: AC
  Administered 2018-06-20: 14:00:00
  Filled 2018-06-20: qty 30

## 2018-06-20 MED ORDER — LIDOCAINE HCL (PF) 2 % IJ SOLN
INTRAMUSCULAR | Status: AC
  Administered 2018-06-20: 14:00:00
  Filled 2018-06-20: qty 10

## 2018-06-20 MED ORDER — SODIUM BICARBONATE 4.2 % IV SOLN
INTRAVENOUS | Status: AC
  Administered 2018-06-20: 14:00:00
  Filled 2018-06-20: qty 10

## 2018-06-21 NOTE — Progress Notes (Signed)
For review.  Update ordering provider

## 2018-06-21 NOTE — Progress Notes (Signed)
For review.  Follow-up path.  Update ordering provider

## 2018-06-26 ENCOUNTER — Ambulatory Visit (HOSPITAL_COMMUNITY): Payer: Medicare Other | Admitting: Hematology

## 2018-06-27 ENCOUNTER — Ambulatory Visit (HOSPITAL_COMMUNITY): Payer: Medicare Other | Admitting: Hematology

## 2018-07-03 ENCOUNTER — Other Ambulatory Visit (HOSPITAL_COMMUNITY): Payer: Medicare Other

## 2018-07-06 ENCOUNTER — Ambulatory Visit (HOSPITAL_COMMUNITY): Payer: Medicare Other | Admitting: Hematology

## 2018-07-06 ENCOUNTER — Other Ambulatory Visit (HOSPITAL_COMMUNITY): Payer: Medicare Other

## 2018-07-07 ENCOUNTER — Ambulatory Visit (HOSPITAL_COMMUNITY): Payer: Medicare Other | Admitting: Hematology

## 2018-07-18 ENCOUNTER — Inpatient Hospital Stay (HOSPITAL_COMMUNITY): Payer: Medicare Other | Attending: Hematology | Admitting: Hematology

## 2018-07-18 ENCOUNTER — Other Ambulatory Visit: Payer: Self-pay

## 2018-07-18 ENCOUNTER — Encounter (HOSPITAL_COMMUNITY): Payer: Self-pay | Admitting: Hematology

## 2018-07-18 VITALS — BP 140/84 | HR 78 | Temp 98.5°F | Resp 18 | Wt 244.5 lb

## 2018-07-18 DIAGNOSIS — Z923 Personal history of irradiation: Secondary | ICD-10-CM | POA: Insufficient documentation

## 2018-07-18 DIAGNOSIS — Z79899 Other long term (current) drug therapy: Secondary | ICD-10-CM | POA: Insufficient documentation

## 2018-07-18 DIAGNOSIS — F329 Major depressive disorder, single episode, unspecified: Secondary | ICD-10-CM | POA: Diagnosis not present

## 2018-07-18 DIAGNOSIS — Z79811 Long term (current) use of aromatase inhibitors: Secondary | ICD-10-CM | POA: Insufficient documentation

## 2018-07-18 DIAGNOSIS — I10 Essential (primary) hypertension: Secondary | ICD-10-CM | POA: Insufficient documentation

## 2018-07-18 DIAGNOSIS — F419 Anxiety disorder, unspecified: Secondary | ICD-10-CM

## 2018-07-18 DIAGNOSIS — Z170 Estrogen receptor positive status [ER+]: Secondary | ICD-10-CM

## 2018-07-18 DIAGNOSIS — Z803 Family history of malignant neoplasm of breast: Secondary | ICD-10-CM | POA: Diagnosis not present

## 2018-07-18 DIAGNOSIS — Z9012 Acquired absence of left breast and nipple: Secondary | ICD-10-CM | POA: Diagnosis not present

## 2018-07-18 DIAGNOSIS — Z7982 Long term (current) use of aspirin: Secondary | ICD-10-CM | POA: Insufficient documentation

## 2018-07-18 DIAGNOSIS — M8580 Other specified disorders of bone density and structure, unspecified site: Secondary | ICD-10-CM | POA: Insufficient documentation

## 2018-07-18 DIAGNOSIS — C50412 Malignant neoplasm of upper-outer quadrant of left female breast: Secondary | ICD-10-CM | POA: Diagnosis present

## 2018-07-18 NOTE — Progress Notes (Signed)
Colleen Cohen, Colleen Cohen [id number]   CLINIC:  Medical Oncology/Hematology  PCP:  [name], MD [facility] and [place] [address] [phone]   REASON FOR VISIT:  Follow-up for Breast Cancer   CURRENT THERAPY: Arimidex   BRIEF ONCOLOGIC HISTORY:  Oncology History  Breast cancer, left breast ([place])  05/05/2015 Procedure   Left needle core biopsy   05/05/2015 Pathology Results   Breast, left, needle core biopsy, 2:00 o'clock - INVASIVE AND IN SITU MAMMARY CARCINOMA WITH CALCIFICATIONS.   06/03/2015 Procedure   Left breast biopsy   06/03/2015 Pathology Results   Breast, left, needle core biopsy, central - ATYPICAL DUCTAL HYPERPLASIA.   06/17/2015 Pathology Results   Breast, partial mastectomy, Left, upper outer quadrant with seed and needle loc - MULTIFOCAL INVASIVE GRADE II LOBULAR CARCINOMA, TWO FOCI MEASURING 1.8 CM AND 1.7 CM IN GREATEST DIMENSION.   06/17/2015 Procedure   Left partial mastectomy by Dr. Judeth Horn   06/17/2015 Initial Diagnosis   Breast cancer (Colleen Cohen)   07/10/2015 Procedure   Left breast re-excision by Dr. Marla Roe   07/10/2015 Pathology Results   Breast, excision, Left Re-excision - BENIGN BREAST TISSUE WITH BIOPSY CHANGES. - NO RESIDUAL TUMOR. - FINAL MARGINS CLEAR.Breast, left, needle core biopsy, outer - INVASIVE LOBULAR CARCINOMA.   08/01/2015 - 08/04/2015 Hospital Admission   Status post bilateral breast reduction  Status post partial mastectomy of left breast  Open wound of right breast  Open breast wound   08/11/2015 Miscellaneous   Mammaprint LOW RISK: No Potential Significant Chemotherapy Benefit   11/11/2015 - 12/15/2015 Radiation Therapy   Adjuvant radiation therapy; [place]; Left breast and regional LN to 50.4 GY/1.8 Gy       01/05/2016 Imaging   Bone density- BMD as determined from [facility] L1-L2 is 1.063 g/cm2 with a T-Score of -0.9. This patient is  considered normal according to [facility]) criteria.   12/2015 -  Anti-estrogen oral therapy   Anastrozole daily       CANCER STAGING: Cancer Staging Breast cancer, left breast [facility]) Staging form: Breast, AJCC 7th Edition - Pathologic stage from 06/17/2015: Stage IIA (T1c(2), N1a, cM0) - Signed by Baird Cancer, PA-C on 08/10/2015    INTERVAL HISTORY:  Ms. Colleen Cohen 68 y.o. female presents today for follow up. Reports overall doing well. Denies any significant fatigue. Currently on Arimidex, tolerating well. She does report occasional L nipple inversion and eversion. Denies any hot flashes or myalgias. She is here to discuss results of recent biopsy.  She denies any new bone pains.  Denies any nausea, vomiting or diarrhea.  She does not have any pain at this time.  Appetite and energy levels are 100%.   REVIEW OF SYSTEMS:  Review of Systems  All other systems reviewed and are negative.    PAST MEDICAL/SURGICAL HISTORY:  Past Medical History:  Diagnosis Date  . Anxiety   . Arthritis   . Breast cancer [facility]) oncologist-  Colleen Cohen ([facility] and Dr Sondra Come    dx 04/ 2017-- left breast lobular multifocal carcinoma x2 sites-- Stage 2 w/ mets x1 positive node--  ER/PR+,  HER2 negative --  s/p  left breast lumpectomy with radioactive seed implants and re-excision for positive margin 06-21-2015  . Depression   . GERD (gastroesophageal reflux disease)   . History of kidney stones   . Hypertension   . Hypothyroidism, postsurgical   .  Migraine   . Myopia of both eyes   . Nephrolithiasis   . PONV (postoperative nausea and vomiting)   . Wears dentures   . Wears glasses   . Wound of right breast    Past Surgical History:  Procedure Laterality Date  . APPLICATION OF A-CELL OF CHEST/ABDOMEN Right 10/30/2015   Procedure: APPLICATION OF A-CELL RIGHT BREAST WOUND;  Surgeon: Claire S Dillingham, DO;  Location: [facility];  Service: Plastics;  Laterality: Right;   . APPLICATION OF WOUND VAC Right 08/14/2015   Procedure: APPLICATION OF WOUND VAC;  Surgeon: Claire S Dillingham, DO;  Location: [facility];  Service: Plastics;  Laterality: Right;  . BREAST LUMPECTOMY WITH RADIOACTIVE SEED AND SENTINEL LYMPH NODE BIOPSY Left 06/17/2015   Procedure: LEFT BREAST LUMPECTOMY WITH RADIOACTIVE SEED AND SENTINEL LYMPH NODE BIOPSY, NEEDLE LOCALIZATION IN ATYPICAL DUCTAL HYPERPLASIA  AREA OF LEFT BREAST;  Surgeon: James Wyatt, MD;  Location: [facility];  Service: General;  Laterality: Left;  . BREAST RECONSTRUCTION Left 07/10/2015   Procedure: REVISION OF LEFT BREAST REDUCTION;  Surgeon: Claire S Dillingham, DO;  Location: [facility];  Service: Plastics;  Laterality: Left;  . BREAST REDUCTION SURGERY Bilateral 06/17/2015   Procedure: BILATERAL BREAST  REDUCTION  WITH RECONSTRUCTION  ON LEFT  AFTER PARTIAL MASTECTOMY;  Surgeon: Claire S Dillingham, DO;  Location: [facility];  Service: Plastics;  Laterality: Bilateral;  . CARPAL TUNNEL RELEASE Right x2  last one 2008  . CHOLECYSTECTOMY  2007  . CYSTO/  URETEROSCOPIC LASER LITHOTRIPSY / STONE EXTRACTION'S/  STENT PLACEMENT  x3  last one 2014  . INCISION AND DRAINAGE OF WOUND Right 08/14/2015   Procedure: Excision of right breast WOUND;  Surgeon: Claire S Dillingham, DO;  Location: [facility];  Service: Plastics;  Laterality: Right;  . INCISION AND DRAINAGE OF WOUND Right 09/22/2015   Procedure: IRRIGATION AND DEBRIDEMENT WOUND;  Surgeon: Claire S Dillingham, DO;  Location: [facility];  Service: Plastics;  Laterality: Right;  . INCISION AND DRAINAGE OF WOUND Right 10/30/2015   Procedure: IRRIGATION AND DEBRIDEMENT RIGHT BREAST WOUND;  Surgeon: Claire S Dillingham, DO;  Location: [facility];  Service: Plastics;  Laterality: Right;  . LUMBAR FUSION  2008   L1 -- L2  . MASS EXCISION Left 09/22/2015   Procedure: EXCISION LEFT BREAST MASS;  Surgeon: Claire S Dillingham,  DO;  Location: [facility];  Service: Plastics;  Laterality: Left;  . NEGATIVE SLEEP STUDY  2007  (approx)  per pt  . PARTIAL NEPHRECTOMY Left 1972   "part of kidney rotted"  . RE-EXCISION OF BREAST LUMPECTOMY Left 07/10/2015   Procedure: RE-EXCISION OF LEFT BREAST LUMPECTOMY ;  Surgeon: James Wyatt, MD;  Location: [facility];  Service: General;  Laterality: Left;  . REDUCTION MAMMAPLASTY     bilaterally  . RETINAL DETACHMENT SURGERY Right 2015  [facility]   and Repair macular hole  . SHOULDER ARTHROSCOPY Right 2013  . THYROIDECTOMY Bilateral 2012     SOCIAL HISTORY:  Social History   Socioeconomic History  . Marital status: Married    Spouse name: Not on file  . Number of children: Not on file  . Years of education: Not on file  . Highest education level: Not on file  Occupational History  . Not on file  Social Needs  . Financial resource strain: Not on file  . Food insecurity    Worry: Not on file    Inability: Not   on file  . Transportation needs    Medical: Not on file    Non-medical: Not on file  Tobacco Use  . Smoking status: Never Smoker  . Smokeless tobacco: Never Used  Substance and Sexual Activity  . Alcohol use: Yes    Comment: rare  . Drug use: No  . Sexual activity: Not on file  Lifestyle  . Physical activity    Days per week: Not on file    Minutes per session: Not on file  . Stress: Not on file  Relationships  . Social connections    Talks on phone: Not on file    Gets together: Not on file    Attends religious service: Not on file    Active member of club or organization: Not on file    Attends meetings of clubs or organizations: Not on file    Relationship status: Not on file  . Intimate partner violence    Fear of current or ex partner: Not on file    Emotionally abused: Not on file    Physically abused: Not on file    Forced sexual activity: Not on file  Other Topics Concern  . Not on file  Social History Narrative  . Not on file     FAMILY HISTORY:  Family History  Problem Relation Age of Onset  . Renal cancer Mother   . Breast cancer Sister     CURRENT MEDICATIONS:  Outpatient Encounter Medications as of 07/18/2018  Medication Sig  . acetaZOLAMIDE (DIAMOX) 250 MG tablet Take by mouth.  . acyclovir (ZOVIRAX) 400 MG tablet Take by mouth.  . anastrozole (ARIMIDEX) 1 MG tablet Take 1 tablet (1 mg total) by mouth daily.  . diazepam (VALIUM) 10 MG tablet Take 10 mg by mouth daily.   . dorzolamide-timolol (COSOPT) 22.3-6.8 MG/ML ophthalmic solution Apply to eye.  . ergocalciferol (VITAMIN D2) 50000 units capsule Take 1 capsule (50,000 Units total) by mouth once a week.  . FLUoxetine (PROZAC) 20 MG capsule Take 20 mg by mouth every morning.  . levothyroxine (SYNTHROID, LEVOTHROID) 112 MCG tablet Take by mouth.  . pantoprazole (PROTONIX) 40 MG tablet Take 40 mg by mouth every morning.  . phenazopyridine (PYRIDIUM) 200 MG tablet Take 200 mg by mouth every 8 (eight) hours as needed.  . prednisoLONE acetate (PRED FORTE) 1 % ophthalmic suspension Apply to eye.  . valACYclovir (VALTREX) 1000 MG tablet   . ALPHAGAN P 0.1 % SOLN PLACE 1 DROP INTO LEFT EYE 3 TIMES DAILY  . aspirin 81 MG chewable tablet Chew 81 mg by mouth daily.  . brimonidine (ALPHAGAN P) 0.1 % SOLN Apply to eye.  . ciprofloxacin (CILOXAN) 0.3 % ophthalmic solution Apply to eye.  . erythromycin ophthalmic ointment Place 1 application into the left eye at bedtime.  . oxyCODONE (OXY IR/ROXICODONE) 5 MG immediate release tablet Take 1 tablet (5 mg total) by mouth every 4 (four) hours as needed. (Patient not taking: Reported on 07/18/2018)   No facility-administered encounter medications on file as of 07/18/2018.     ALLERGIES:  Allergies  Allergen Reactions  . Sulfamethoxazole-Trimethoprim Other (See Comments)    Lactic acid increase Lactic acid increase Lactic acid increase  . Vancomycin Hives    Other reaction(s): Red Man Syndrome  . Codeine Rash      PHYSICAL EXAM:  ECOG Performance status: 1  Vitals:   07/18/18 1412  BP: 140/84  Pulse: 78  Resp: 18  Temp: 98.5 F (36.9 C)    SpO2: 96%   Filed Weights   07/18/18 1412  Weight: 244 lb 8 oz (110.9 kg)    Physical Exam Constitutional:      Appearance: Normal appearance. She is obese.  HENT:     Head: Normocephalic.  Eyes:     Conjunctiva/sclera: Conjunctivae normal.  Neck:     Musculoskeletal: Normal range of motion.  Cardiovascular:     Rate and Rhythm: Normal rate and regular rhythm.     Pulses: Normal pulses.     Heart sounds: Normal heart sounds.  Pulmonary:     Effort: Pulmonary effort is normal.     Breath sounds: Normal breath sounds.  Abdominal:     General: Bowel sounds are normal.     Palpations: Abdomen is soft.  Musculoskeletal: Normal range of motion.  Skin:    General: Skin is warm and dry.  Neurological:     General: No focal deficit present.     Mental Status: She is alert and oriented to person, place, and time.  Psychiatric:        Mood and Affect: Mood normal.        Behavior: Behavior normal.        Thought Content: Thought content normal.        Judgment: Judgment normal.   Right partial mastectomy site does not have any palpable masses.  Left breast has no palpable masses.  Nipple is normal.  No palpable adenopathy.   LABORATORY DATA:  I have reviewed the labs as listed.  CBC    Component Value Date/Time   WBC 8.0 06/13/2018 0943   RBC 4.50 06/13/2018 0943   HGB 12.1 06/13/2018 0943   HCT 39.9 06/13/2018 0943   PLT 271 06/13/2018 0943   MCV 88.7 06/13/2018 0943   MCH 26.9 06/13/2018 0943   MCHC 30.3 06/13/2018 0943   RDW 12.9 06/13/2018 0943   LYMPHSABS 2.1 06/13/2018 0943   MONOABS 0.5 06/13/2018 0943   EOSABS 0.7 (H) 06/13/2018 0943   BASOSABS 0.0 06/13/2018 0943   CMP Latest Ref Rng & Units 06/13/2018 06/14/2017 12/09/2016  Glucose 70 - 99 mg/dL 120(H) 94 101(H)  BUN 8 - 23 mg/dL 14 16 17  Creatinine 0.44 - 1.00  mg/dL 0.81 0.80 0.83  Sodium 135 - 145 mmol/L 140 138 138  Potassium 3.5 - 5.1 mmol/L 4.2 4.3 4.0  Chloride 98 - 111 mmol/L 101 98(L) 103  CO2 22 - 32 mmol/L 29 32 27  Calcium 8.9 - 10.3 mg/dL 9.5 10.1 9.9  Total Protein 6.5 - 8.1 g/dL 6.8 7.1 7.1  Total Bilirubin 0.3 - 1.2 mg/dL 0.2(L) 0.6 0.6  Alkaline Phos 38 - 126 U/L 69 71 89  AST 15 - 41 U/L 17 17 26  ALT 0 - 44 U/L 13 14 29        ASSESSMENT & PLAN:   Breast cancer, left breast ([facility]) 1.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 - Treated with lumpectomy, bilateral breast reduction surgery. - MammaPrint results show low risk indicating no benefit from chemotherapy. - Completed adjuvant breast radiation therapy in [place] in November 2017. -Anastrozole started in December 2017.  She is tolerating it very well.  - Bilateral diagnostic mammogram done 06/07/2017 showed surgical changes with no evidence of malignancy.  Targeted USN of the left breast showed scarring and fat necrosis/oil cysts.   - Diagnostic mammogram on 06/13/18: suggested an indeterminate retroareolar left breast mass at 5:30. -   Breast biopsy preformed on 06/20/18 was negative for malignancy but suggested L breast parenchyma with dense stromal fibrosis.  - Pt with intermittent L nipple inversion; likely secondary to fibrosis or scarring.  - Recommend continue with Arimidex daily. Plan to repeat diagnostic mammogram in 05/2019. - RTC in 6 mths.  2.  Bone health: - DEXA scan on 06/20/2018 shows T score of -1.2.  This is consistent with osteopenia. - DEXA scan in December 2017 shows T score of -0.9. - She was advised to take calcium and vitamin D supplements.   Total time spent is 25 minutes with more than 50% of the time spent face-to-face discussing biopsy results, DEXA scan results, treatment plan and coordination of care.    Orders placed this encounter:  Orders Placed This Encounter   Procedures  . CBC with Differential  . Comprehensive metabolic panel  . Vitamin D 25 hydroxy      Derek Jack, MD [place] [phone]

## 2018-07-18 NOTE — Assessment & Plan Note (Addendum)
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 - Treated with lumpectomy, bilateral breast reduction surgery. - MammaPrint results show low risk indicating no benefit from chemotherapy. - Completed adjuvant breast radiation therapy in [place] in November 2017. -Anastrozole started in December 2017.  She is tolerating it very well.  - Bilateral diagnostic mammogram done 06/07/2017 showed surgical changes with no evidence of malignancy.  Targeted USN of the left breast showed scarring and fat necrosis/oil cysts.   - Diagnostic mammogram on 06/13/18: suggested an indeterminate retroareolar left breast mass at 5:30. - Breast biopsy preformed on 06/20/18 was negative for malignancy but suggested L breast parenchyma with dense stromal fibrosis.  - Pt with intermittent L nipple inversion; likely secondary to fibrosis or scarring.  - Recommend continue with Arimidex daily. Plan to repeat diagnostic mammogram in 05/2019. - RTC in 6 mths.  2.  Bone health: - DEXA scan on 06/20/2018 shows T score of -1.2.  This is consistent with osteopenia. - DEXA scan in December 2017 shows T score of -0.9. - She was advised to take calcium and vitamin D supplements.

## 2019-01-23 ENCOUNTER — Other Ambulatory Visit: Payer: Self-pay

## 2019-01-23 ENCOUNTER — Inpatient Hospital Stay (HOSPITAL_COMMUNITY): Payer: Medicare Other | Attending: Hematology

## 2019-01-23 DIAGNOSIS — Z923 Personal history of irradiation: Secondary | ICD-10-CM | POA: Diagnosis not present

## 2019-01-23 DIAGNOSIS — Z9012 Acquired absence of left breast and nipple: Secondary | ICD-10-CM | POA: Diagnosis not present

## 2019-01-23 DIAGNOSIS — C50412 Malignant neoplasm of upper-outer quadrant of left female breast: Secondary | ICD-10-CM

## 2019-01-23 DIAGNOSIS — Z79811 Long term (current) use of aromatase inhibitors: Secondary | ICD-10-CM | POA: Insufficient documentation

## 2019-01-23 DIAGNOSIS — Z170 Estrogen receptor positive status [ER+]: Secondary | ICD-10-CM | POA: Diagnosis not present

## 2019-01-23 LAB — CBC WITH DIFFERENTIAL/PLATELET
Abs Immature Granulocytes: 0.06 10*3/uL (ref 0.00–0.07)
Basophils Absolute: 0 10*3/uL (ref 0.0–0.1)
Basophils Relative: 0 %
Eosinophils Absolute: 0.4 10*3/uL (ref 0.0–0.5)
Eosinophils Relative: 5 %
HCT: 40.4 % (ref 36.0–46.0)
Hemoglobin: 12.3 g/dL (ref 12.0–15.0)
Immature Granulocytes: 1 %
Lymphocytes Relative: 25 %
Lymphs Abs: 2.1 10*3/uL (ref 0.7–4.0)
MCH: 27.2 pg (ref 26.0–34.0)
MCHC: 30.4 g/dL (ref 30.0–36.0)
MCV: 89.4 fL (ref 80.0–100.0)
Monocytes Absolute: 0.5 10*3/uL (ref 0.1–1.0)
Monocytes Relative: 6 %
Neutro Abs: 5.2 10*3/uL (ref 1.7–7.7)
Neutrophils Relative %: 63 %
Platelets: 278 10*3/uL (ref 150–400)
RBC: 4.52 MIL/uL (ref 3.87–5.11)
RDW: 12.8 % (ref 11.5–15.5)
WBC: 8.3 10*3/uL (ref 4.0–10.5)
nRBC: 0 % (ref 0.0–0.2)

## 2019-01-23 LAB — COMPREHENSIVE METABOLIC PANEL
ALT: 14 U/L (ref 0–44)
AST: 17 U/L (ref 15–41)
Albumin: 3.9 g/dL (ref 3.5–5.0)
Alkaline Phosphatase: 53 U/L (ref 38–126)
Anion gap: 11 (ref 5–15)
BUN: 17 mg/dL (ref 8–23)
CO2: 28 mmol/L (ref 22–32)
Calcium: 9.4 mg/dL (ref 8.9–10.3)
Chloride: 100 mmol/L (ref 98–111)
Creatinine, Ser: 0.63 mg/dL (ref 0.44–1.00)
GFR calc Af Amer: >60 mL/min (ref >60)
GFR calc non Af Amer: >60 mL/min (ref >60)
Glucose, Bld: 117 mg/dL — ABNORMAL HIGH (ref 70–99)
Potassium: 4.1 mmol/L (ref 3.5–5.1)
Sodium: 139 mmol/L (ref 135–145)
Total Bilirubin: 0.4 mg/dL (ref 0.3–1.2)
Total Protein: 7 g/dL (ref 6.5–8.1)

## 2019-01-23 LAB — VITAMIN D 25 HYDROXY (VIT D DEFICIENCY, FRACTURES): Vit D, 25-Hydroxy: 51.53 ng/mL (ref 30–100)

## 2019-01-30 ENCOUNTER — Encounter (HOSPITAL_COMMUNITY): Payer: Self-pay | Admitting: Hematology

## 2019-01-30 ENCOUNTER — Other Ambulatory Visit: Payer: Self-pay

## 2019-01-30 ENCOUNTER — Inpatient Hospital Stay (HOSPITAL_COMMUNITY): Payer: Medicare Other | Attending: Hematology | Admitting: Hematology

## 2019-01-30 VITALS — BP 152/91 | HR 71 | Temp 97.6°F | Resp 18 | Wt 247.3 lb

## 2019-01-30 DIAGNOSIS — Z79811 Long term (current) use of aromatase inhibitors: Secondary | ICD-10-CM | POA: Diagnosis not present

## 2019-01-30 DIAGNOSIS — N6310 Unspecified lump in the right breast, unspecified quadrant: Secondary | ICD-10-CM | POA: Diagnosis not present

## 2019-01-30 DIAGNOSIS — Z9012 Acquired absence of left breast and nipple: Secondary | ICD-10-CM | POA: Diagnosis not present

## 2019-01-30 DIAGNOSIS — Z170 Estrogen receptor positive status [ER+]: Secondary | ICD-10-CM

## 2019-01-30 DIAGNOSIS — M8580 Other specified disorders of bone density and structure, unspecified site: Secondary | ICD-10-CM | POA: Diagnosis not present

## 2019-01-30 DIAGNOSIS — Z923 Personal history of irradiation: Secondary | ICD-10-CM | POA: Diagnosis not present

## 2019-01-30 DIAGNOSIS — C50412 Malignant neoplasm of upper-outer quadrant of left female breast: Secondary | ICD-10-CM

## 2019-01-30 NOTE — Progress Notes (Signed)
[address], [place] [address]   CLINIC:  Medical Oncology/Hematology  PCP:  [name], MD [facility] and [place] [address] [phone]   REASON FOR VISIT:  Follow-up for breast cancer  CURRENT THERAPY: Arimidex  BRIEF ONCOLOGIC HISTORY:  Oncology History  Breast cancer, left breast ([place])  05/05/2015 Procedure   Left needle core biopsy   05/05/2015 Pathology Results   Breast, left, needle core biopsy, 2:00 o'clock - INVASIVE AND IN SITU MAMMARY CARCINOMA WITH CALCIFICATIONS.   06/03/2015 Procedure   Left breast biopsy   06/03/2015 Pathology Results   Breast, left, needle core biopsy, central - ATYPICAL DUCTAL HYPERPLASIA.   06/17/2015 Pathology Results   Breast, partial mastectomy, Left, upper outer quadrant with seed and needle loc - MULTIFOCAL INVASIVE GRADE II LOBULAR CARCINOMA, TWO FOCI MEASURING 1.8 CM AND 1.7 CM IN GREATEST DIMENSION.   06/17/2015 Procedure   Left partial mastectomy by Dr. Judeth Horn   06/17/2015 Initial Diagnosis   Breast cancer ([place])   07/10/2015 Procedure   Left breast re-excision by Dr. Marla Roe   07/10/2015 Pathology Results   Breast, excision, Left Re-excision - BENIGN BREAST TISSUE WITH BIOPSY CHANGES. - NO RESIDUAL TUMOR. - FINAL MARGINS CLEAR.Breast, left, needle core biopsy, outer - INVASIVE LOBULAR CARCINOMA.   08/01/2015 - 08/04/2015 Hospital Admission   Status post bilateral breast reduction  Status post partial mastectomy of left breast  Open wound of right breast  Open breast wound   08/11/2015 Miscellaneous   Mammaprint LOW RISK: No Potential Significant Chemotherapy Benefit   11/11/2015 - 12/15/2015 Radiation Therapy   Adjuvant radiation therapy; [place]; Left breast and regional LN to 50.4 GY/1.8 Gy       01/05/2016 Imaging   Bone density- BMD as determined from [facility] L1-L2 is 1.063 g/cm2 with a T-Score of -0.9. This patient is  considered normal according to [facility]) criteria.   12/2015 -  Anti-estrogen oral therapy   Anastrozole daily       CANCER STAGING: Cancer Staging Breast cancer, left breast [facility]) Staging form: Breast, AJCC 7th Edition - Pathologic stage from 06/17/2015: Stage IIA (T1c(2), N1a, cM0) - Signed by Baird Cancer, PA-C on 08/10/2015    INTERVAL HISTORY:  Colleen Cohen 69 y.o. female presents today for follow-up.  She reports overall doing well.  Denies any significant fatigue.  Reports a new nodule in her right breast.  She describes it as a size of a marble.  No pain is associated with the site.  Her last mammogram was in May 2020.  She continues on Arimidex tolerating well.  She is here for repeat labs and office visit.   REVIEW OF SYSTEMS:  Review of Systems  Constitutional: Negative for fatigue.  HENT:  Negative.   Eyes: Negative.   Respiratory: Negative.   Cardiovascular: Negative.   Gastrointestinal: Negative.   Endocrine: Negative.   Genitourinary: Negative.    Musculoskeletal: Positive for arthralgias and back pain.  Skin:       Mastectomy and breast reduction scars noted.  Neurological: Negative.   Hematological: Negative.   Psychiatric/Behavioral: Negative.      PAST MEDICAL/SURGICAL HISTORY:  Past Medical History:  Diagnosis Date  . Anxiety   . Arthritis   . Breast cancer [facility]) oncologist-  shannon penland ([facility] and Dr Sondra Come    dx 04/ 2017-- left breast lobular multifocal carcinoma x2 sites-- Stage 2 w/ mets  x1 positive node--  ER/PR+,  HER2 negative --  s/p  left breast lumpectomy with radioactive seed implants and re-excision for positive margin 06-21-2015  . Depression   . GERD (gastroesophageal reflux disease)   . History of kidney stones   . Hypertension   . Hypothyroidism, postsurgical   . Migraine   . Myopia of both eyes   . Nephrolithiasis   . PONV (postoperative nausea and vomiting)   . Wears dentures   . Wears  glasses   . Wound of right breast    Past Surgical History:  Procedure Laterality Date  . APPLICATION OF A-CELL OF CHEST/ABDOMEN Right 10/30/2015   Procedure: APPLICATION OF A-CELL RIGHT BREAST WOUND;  Surgeon: Loel Lofty Dillingham, DO;  Location: [place];  Service: Plastics;  Laterality: Right;  . APPLICATION OF WOUND VAC Right 08/14/2015   Procedure: APPLICATION OF WOUND VAC;  Surgeon: Loel Lofty Dillingham, DO;  Location: [place];  Service: Plastics;  Laterality: Right;  . BREAST LUMPECTOMY WITH RADIOACTIVE SEED AND SENTINEL LYMPH NODE BIOPSY Left 06/17/2015   Procedure: LEFT BREAST LUMPECTOMY WITH RADIOACTIVE SEED AND SENTINEL LYMPH NODE BIOPSY, NEEDLE LOCALIZATION IN ATYPICAL DUCTAL HYPERPLASIA  AREA OF LEFT BREAST;  Surgeon: Judeth Horn, MD;  Location: [place];  Service: General;  Laterality: Left;  . BREAST RECONSTRUCTION Left 07/10/2015   Procedure: REVISION OF LEFT BREAST REDUCTION;  Surgeon: Loel Lofty Dillingham, DO;  Location: [place];  Service: Plastics;  Laterality: Left;  . BREAST REDUCTION SURGERY Bilateral 06/17/2015   Procedure: BILATERAL BREAST  REDUCTION  WITH RECONSTRUCTION  ON LEFT  AFTER PARTIAL MASTECTOMY;  Surgeon: Wallace Going, DO;  Location: [place];  Service: Plastics;  Laterality: Bilateral;  . CARPAL TUNNEL RELEASE Right x2  last one 2008  . CHOLECYSTECTOMY  2007  . CYSTO/  URETEROSCOPIC LASER LITHOTRIPSY / STONE EXTRACTION'S/  STENT PLACEMENT  x3  last one 2014  . INCISION AND DRAINAGE OF WOUND Right 08/14/2015   Procedure: Excision of right breast WOUND;  Surgeon: Loel Lofty Dillingham, DO;  Location: [place];  Service: Plastics;  Laterality: Right;  . INCISION AND DRAINAGE OF WOUND Right 09/22/2015   Procedure: IRRIGATION AND DEBRIDEMENT WOUND;  Surgeon: Wallace Going, DO;  Location: [place];  Service: Plastics;  Laterality: Right;  . INCISION AND DRAINAGE OF WOUND Right  10/30/2015   Procedure: IRRIGATION AND DEBRIDEMENT RIGHT BREAST WOUND;  Surgeon: Loel Lofty Dillingham, DO;  Location: [place];  Service: Plastics;  Laterality: Right;  . LUMBAR FUSION  2008   L1 -- L2  . MASS EXCISION Left 09/22/2015   Procedure: EXCISION LEFT BREAST MASS;  Surgeon: Wallace Going, DO;  Location: [place];  Service: Plastics;  Laterality: Left;  . NEGATIVE SLEEP STUDY  2007  (approx)  per pt  . PARTIAL NEPHRECTOMY Left 1972   "part of kidney rotted"  . RE-EXCISION OF BREAST LUMPECTOMY Left 07/10/2015   Procedure: RE-EXCISION OF LEFT BREAST LUMPECTOMY ;  Surgeon: Judeth Horn, MD;  Location: [place];  Service: General;  Laterality: Left;  . REDUCTION MAMMAPLASTY     bilaterally  . RETINAL DETACHMENT SURGERY Right 2015  [facility]   and Repair macular hole  . SHOULDER ARTHROSCOPY Right 2013  . THYROIDECTOMY Bilateral 2012     SOCIAL HISTORY:  Social History   Socioeconomic History  . Marital status: Married    Spouse name: Not on file  . Number of children:  Not on file  . Years of education: Not on file  . Highest education level: Not on file  Occupational History  . Not on file  Tobacco Use  . Smoking status: Never Smoker  . Smokeless tobacco: Never Used  Substance and Sexual Activity  . Alcohol use: Yes    Comment: rare  . Drug use: No  . Sexual activity: Not on file  Other Topics Concern  . Not on file  Social History Narrative  . Not on file   Social Determinants of Health   Financial Resource Strain:   . Difficulty of Paying Living Expenses: Not on file  Food Insecurity:   . Worried About Charity fundraiser in the Last Year: Not on file  . Ran Out of Food in the Last Year: Not on file  Transportation Needs:   . Lack of Transportation (Medical): Not on file  . Lack of Transportation (Non-Medical): Not on file  Physical Activity:   . Days of Exercise per Week: Not on file  . Minutes of Exercise per Session: Not on file  Stress:   .  Feeling of Stress : Not on file  Social Connections:   . Frequency of Communication with Friends and Family: Not on file  . Frequency of Social Gatherings with Friends and Family: Not on file  . Attends Religious Services: Not on file  . Active Member of Clubs or Organizations: Not on file  . Attends Archivist Meetings: Not on file  . Marital Status: Not on file  Intimate Partner Violence:   . Fear of Current or Ex-Partner: Not on file  . Emotionally Abused: Not on file  . Physically Abused: Not on file  . Sexually Abused: Not on file    FAMILY HISTORY:  Family History  Problem Relation Age of Onset  . Renal cancer Mother   . Breast cancer Sister     CURRENT MEDICATIONS:  Outpatient Encounter Medications as of 01/30/2019  Medication Sig  . ALPHAGAN P 0.1 % SOLN PLACE 1 DROP INTO LEFT EYE 3 TIMES DAILY  . anastrozole (ARIMIDEX) 1 MG tablet Take 1 tablet (1 mg total) by mouth daily.  Marland Kitchen aspirin 81 MG chewable tablet Chew 81 mg by mouth daily.  . Cholecalciferol 25 MCG (1000 UT) tablet Take by mouth.  . ciprofloxacin (CILOXAN) 0.3 % ophthalmic solution Apply to eye.  . diazepam (VALIUM) 10 MG tablet Take 10 mg by mouth daily.   . dorzolamide-timolol (COSOPT) 22.3-6.8 MG/ML ophthalmic solution Apply to eye.  Marland Kitchen FLUoxetine (PROZAC) 20 MG capsule Take 20 mg by mouth every morning.  Marland Kitchen levothyroxine (SYNTHROID, LEVOTHROID) 112 MCG tablet Take by mouth.  . pantoprazole (PROTONIX) 40 MG tablet Take 40 mg by mouth every morning.  . [DISCONTINUED] acyclovir (ZOVIRAX) 400 MG tablet Take by mouth.  . [DISCONTINUED] ergocalciferol (VITAMIN D2) 50000 units capsule Take 1 capsule (50,000 Units total) by mouth once a week.  . meloxicam (MOBIC) 15 MG tablet Take 15 mg by mouth daily.  . phenazopyridine (PYRIDIUM) 200 MG tablet Take 200 mg by mouth every 8 (eight) hours as needed.  . valACYclovir (VALTREX) 1000 MG tablet   . [DISCONTINUED] acetaZOLAMIDE (DIAMOX) 250 MG tablet Take by  mouth.  . [DISCONTINUED] brimonidine (ALPHAGAN P) 0.1 % SOLN Apply to eye.  . [DISCONTINUED] erythromycin ophthalmic ointment Place 1 application into the left eye at bedtime.  . [DISCONTINUED] oxyCODONE (OXY IR/ROXICODONE) 5 MG immediate release tablet Take 1 tablet (5 mg total) by  mouth every 4 (four) hours as needed. (Patient not taking: Reported on 07/18/2018)  . [DISCONTINUED] prednisoLONE acetate (PRED FORTE) 1 % ophthalmic suspension Apply to eye.   No facility-administered encounter medications on file as of 01/30/2019.    ALLERGIES:  Allergies  Allergen Reactions  . Sulfamethoxazole-Trimethoprim Other (See Comments)    Lactic acid increase Lactic acid increase Lactic acid increase  . Vancomycin Hives    Other reaction(s): Red Man Syndrome  . Codeine Rash     PHYSICAL EXAM:  ECOG Performance status: 1  Vitals:   01/30/19 1331 01/30/19 1346  BP: (!) 152/91 (!) 152/91  Pulse: 71 71  Resp: 18 18  Temp: 97.6 F (36.4 C) 97.6 F (36.4 C)  SpO2: 94% 94%   Filed Weights   01/30/19 1331 01/30/19 1346  Weight: 247 lb 4.8 oz (112.2 kg) 247 lb 4.8 oz (112.2 kg)    Physical Exam   LABORATORY DATA:  I have reviewed the labs as listed.  CBC    Component Value Date/Time   WBC 8.3 01/23/2019 1110   RBC 4.52 01/23/2019 1110   HGB 12.3 01/23/2019 1110   HCT 40.4 01/23/2019 1110   PLT 278 01/23/2019 1110   MCV 89.4 01/23/2019 1110   MCH 27.2 01/23/2019 1110   MCHC 30.4 01/23/2019 1110   RDW 12.8 01/23/2019 1110   LYMPHSABS 2.1 01/23/2019 1110   MONOABS 0.5 01/23/2019 1110   EOSABS 0.4 01/23/2019 1110   BASOSABS 0.0 01/23/2019 1110   CMP Latest Ref Rng & Units 01/23/2019 06/13/2018 06/14/2017  Glucose 70 - 99 mg/dL 117(H) 120(H) 94  BUN 8 - 23 mg/dL _0 Creatinine 0.44 - 1.00 mg/dL 0.63 0.81 0.80  Sodium 135 - 145 mmol/L 139 140 138  Potassium 3.5 - 5.1 mmol/L 4.1 4.2 4.3  Chloride 98 - 111 mmol/L 100 101 98(L)  CO2 22 - 32 mmol/L 28 29 32  Calcium 8.9 -  10.3 mg/dL 9.4 9.5 10.1  Total Protein 6.5 - 8.1 g/dL 7.0 6.8 7.1  Total Bilirubin 0.3 - 1.2 mg/dL 0.4 0.2(L) 0.6  Alkaline Phos 38 - 126 U/L 53 69 71  AST 15 - 41 U/L _1 ALT 0 - 44 U/L _2 ASSESSMENT & PLAN:   Malignant neoplasm of upper-outer quadrant of left female breast ([place]) 1.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 - Treated with lumpectomy, bilateral breast reduction surgery. - MammaPrint results show low risk indicating no benefit from chemotherapy. - Completed adjuvant breast radiation therapy in [place] in November 2017. -Anastrozole started in December 2017.  She is tolerating it very well.  - Bilateral diagnostic mammogram done 06/07/2017 showed surgical changes with no evidence of malignancy.  Targeted USN of the left breast showed scarring and fat necrosis/oil cysts.   - Diagnostic mammogram on 06/13/18: suggested an indeterminate retroareolar left breast mass at 5:30. - Breast biopsy preformed on 06/20/18 was negative for malignancy but suggested L breast parenchyma with dense stromal fibrosis.  - Pt with intermittent L nipple inversion; likely secondary to fibrosis or scarring.  -Patient presents today with a 2 cm size nodule at the 5 o'clock position about 1 inch from the nipple.  The pain associated with this nodule.  Patient states that the nodule is new.  Have recommended we proceed with ultrasound to determine underlying etiology.  I will follow up with  the patient once conclusion is resulted.  We will continue Arimidex.  2.  Bone health: - DEXA scan on 06/20/2018 shows T score of -1.2.  This is consistent with osteopenia. - DEXA scan in December 2017 shows T score of -0.9. -DEXA scan completed in May 2020 revealed a T score of -1.2.  This is consistent with osteopenia.  Recommend patient continue on vitamin D and calcium supplements.       Orders placed this  encounter:  Orders Placed This Encounter  Procedures  . MM Digital Diagnostic Bilat  . US Breast Complete Uni Right Inc Axilla      Emmarose Klinke R Brennyn Haisley  [facility] [phone]

## 2019-01-30 NOTE — Assessment & Plan Note (Signed)
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 - Treated with lumpectomy, bilateral breast reduction surgery. - MammaPrint results show low risk indicating no benefit from chemotherapy. - Completed adjuvant breast radiation therapy in [place] in November 2017. -Anastrozole started in December 2017.  She is tolerating it very well.  - Bilateral diagnostic mammogram done 06/07/2017 showed surgical changes with no evidence of malignancy.  Targeted USN of the left breast showed scarring and fat necrosis/oil cysts.   - Diagnostic mammogram on 06/13/18: suggested an indeterminate retroareolar left breast mass at 5:30. - Breast biopsy preformed on 06/20/18 was negative for malignancy but suggested L breast parenchyma with dense stromal fibrosis.  - Pt with intermittent L nipple inversion; likely secondary to fibrosis or scarring.  -Patient presents today with a 2 cm size nodule at the 5 o'clock position about 1 inch from the nipple.  The pain associated with this nodule.  Patient states that the nodule is new.  Have recommended we proceed with ultrasound to determine underlying etiology.  I will follow up with the patient once conclusion is resulted.  We will continue Arimidex.  2.  Bone health: - DEXA scan on 06/20/2018 shows T score of -1.2.  This is consistent with osteopenia. - DEXA scan in December 2017 shows T score of -0.9. -DEXA scan completed in May 2020 revealed a T score of -1.2.  This is consistent with osteopenia.  Recommend patient continue on vitamin D and calcium supplements.

## 2019-02-13 ENCOUNTER — Other Ambulatory Visit: Payer: Self-pay

## 2019-02-13 ENCOUNTER — Ambulatory Visit (HOSPITAL_COMMUNITY)
Admission: RE | Admit: 2019-02-13 | Discharge: 2019-02-13 | Disposition: A | Discharge status: Home / Self Care | Payer: Medicare Other | Source: Ambulatory Visit | Attending: Hematology | Admitting: Hematology

## 2019-02-13 ENCOUNTER — Other Ambulatory Visit (HOSPITAL_COMMUNITY): Payer: Self-pay | Admitting: Hematology

## 2019-02-13 DIAGNOSIS — C50412 Malignant neoplasm of upper-outer quadrant of left female breast: Secondary | ICD-10-CM

## 2019-02-13 DIAGNOSIS — Z170 Estrogen receptor positive status [ER+]: Secondary | ICD-10-CM | POA: Insufficient documentation

## 2019-02-15 ENCOUNTER — Inpatient Hospital Stay (HOSPITAL_COMMUNITY): Payer: Medicare Other | Admitting: Nurse Practitioner

## 2019-02-15 ENCOUNTER — Other Ambulatory Visit: Payer: Self-pay

## 2019-02-15 DIAGNOSIS — C50412 Malignant neoplasm of upper-outer quadrant of left female breast: Secondary | ICD-10-CM

## 2019-02-15 NOTE — Assessment & Plan Note (Addendum)
1. 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 -Treated with lumpectomy, bilateral breast reduction surgery. -Mammaprint results showed low risk indicating no benefit from chemotherapy. -Completed adjuvant breast radiation therapy and even in November 2017. -Anastrozole was started in December 2017.  She is tolerating this very well. -Bilateral diagnostic mammogram done on 06/13/2018 suggested an indeterminate retroareolar left breast mass at 5:30 position. -Breast biopsy performed on 06/20/2018 was negative for malignancy but suggested left breast parenchyma with dense stromal fibrosis. -Patient with intermittent left nipple inversion; likely secondary to fibrosis or scarring. -Patient presented for her doctor's appointment on 01/30/2019 with a 2 cm sized nodule at the 5 o'clock position about 1 inch from the nipple.  There was pain associated with the nodule.  Patient states that the nodule is new. -Diagnostic mammogram done on 02/13/2019 was BI-RADS Category 2 benign. -Patient had an ultrasound on 02/13/2019 which showed BI-RADS Category 2 benign.  Suggested to keep mammogram that is due in 4 months. -We will continue Arimidex at this time. -She will follow up in 4 months with labs after her mammogram.  2.  Osteopenia: -DEXA scan 12/2015 showed T score of -0.9. -DEXA scan on 06/20/2018 showed a T score of -1.2.  This is consistent with osteopenia. -She will continue calcium and vitamin D daily at this time.

## 2019-02-15 NOTE — Progress Notes (Unsigned)
[address], [place] [address]   CLINIC:  Medical Oncology/Hematology  PCP:  [name], MD [facility] and [place] [address] [phone]   REASON FOR VISIT: Follow-up for breast cancer  CURRENT THERAPY: Arimidex  BRIEF ONCOLOGIC HISTORY:  Oncology History  Breast cancer, left breast ([place])  05/05/2015 Procedure   Left needle core biopsy   05/05/2015 Pathology Results   Breast, left, needle core biopsy, 2:00 o'clock - INVASIVE AND IN SITU MAMMARY CARCINOMA WITH CALCIFICATIONS.   06/03/2015 Procedure   Left breast biopsy   06/03/2015 Pathology Results   Breast, left, needle core biopsy, central - ATYPICAL DUCTAL HYPERPLASIA.   06/17/2015 Pathology Results   Breast, partial mastectomy, Left, upper outer quadrant with seed and needle loc - MULTIFOCAL INVASIVE GRADE II LOBULAR CARCINOMA, TWO FOCI MEASURING 1.8 CM AND 1.7 CM IN GREATEST DIMENSION.   06/17/2015 Procedure   Left partial mastectomy by Dr. Judeth Horn   06/17/2015 Initial Diagnosis   Breast cancer ([place])   07/10/2015 Procedure   Left breast re-excision by Dr. Marla Roe   07/10/2015 Pathology Results   Breast, excision, Left Re-excision - BENIGN BREAST TISSUE WITH BIOPSY CHANGES. - NO RESIDUAL TUMOR. - FINAL MARGINS CLEAR.Breast, left, needle core biopsy, outer - INVASIVE LOBULAR CARCINOMA.   08/01/2015 - 08/04/2015 Hospital Admission   Status post bilateral breast reduction  Status post partial mastectomy of left breast  Open wound of right breast  Open breast wound   08/11/2015 Miscellaneous   Mammaprint LOW RISK: No Potential Significant Chemotherapy Benefit   11/11/2015 - 12/15/2015 Radiation Therapy   Adjuvant radiation therapy; [place]; Left breast and regional LN to 50.4 GY/1.8 Gy       01/05/2016 Imaging   Bone density- BMD as determined from [facility] L1-L2 is 1.063 g/cm2 with a T-Score of -0.9. This patient is  considered normal according to [facility]) criteria.   12/2015 -  Anti-estrogen oral therapy   Anastrozole daily       CANCER STAGING: Cancer Staging Breast cancer, left breast [facility]) Staging form: Breast, AJCC 7th Edition - Pathologic stage from 06/17/2015: Stage IIA (T1c(2), N1a, cM0) - Signed by Baird Cancer, PA-C on 08/10/2015    INTERVAL HISTORY:  Ms. Stroschein 69 y.o. female was called for telephone visit today to follow-up with the new nodule that was found.  Patient does have a history of breast cancer.  Patient is taking her anastrozole as prescribed.  She is not having any side effects from it.  No new lumps or bumps were felt since her last visit. Denies any nausea, vomiting, or diarrhea. Denies any new pains. Had not noticed any recent bleeding such as epistaxis, hematuria or hematochezia. Denies recent chest pain on exertion, shortness of breath on minimal exertion, pre-syncopal episodes, or palpitations. Denies any numbness or tingling in hands or feet. Denies any recent fevers, infections, or recent hospitalizations. Patient reports appetite at 100% and energy level at 75%.  She is eating well maintain her weight this time.    REVIEW OF SYSTEMS:  Review of Systems  All other systems reviewed and are negative.    PAST MEDICAL/SURGICAL HISTORY:  Past Medical History:  Diagnosis Date  . Anxiety   . Arthritis   . 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 Depression   . GERD (gastroesophageal reflux disease)   . History of kidney stones   . Hypertension   . Hypothyroidism, postsurgical   . Migraine   . Myopia of both eyes   . Nephrolithiasis   . PONV (postoperative  nausea and vomiting)   . Wears dentures   . Wears glasses   . Wound of right breast    Past Surgical History:  Procedure Laterality Date  . APPLICATION OF A-CELL OF CHEST/ABDOMEN Right 10/30/2015   Procedure: APPLICATION OF A-CELL RIGHT BREAST WOUND;  Surgeon: Loel Lofty Dillingham, DO;  Location: [place];  Service: Plastics;  Laterality: Right;  . APPLICATION OF WOUND VAC Right 08/14/2015   Procedure: APPLICATION OF WOUND VAC;  Surgeon: Loel Lofty Dillingham, DO;  Location: [place];  Service: Plastics;  Laterality: Right;  . BREAST LUMPECTOMY WITH RADIOACTIVE SEED AND SENTINEL LYMPH NODE BIOPSY Left 06/17/2015   Procedure: LEFT BREAST LUMPECTOMY WITH RADIOACTIVE SEED AND SENTINEL LYMPH NODE BIOPSY, NEEDLE LOCALIZATION IN ATYPICAL DUCTAL HYPERPLASIA  AREA OF LEFT BREAST;  Surgeon: Judeth Horn, MD;  Location: [place];  Service: General;  Laterality: Left;  . BREAST RECONSTRUCTION Left 07/10/2015   Procedure: REVISION OF LEFT BREAST REDUCTION;  Surgeon: Loel Lofty Dillingham, DO;  Location: [place];  Service: Plastics;  Laterality: Left;  . BREAST REDUCTION SURGERY Bilateral 06/17/2015   Procedure: BILATERAL BREAST  REDUCTION  WITH RECONSTRUCTION  ON LEFT  AFTER PARTIAL MASTECTOMY;  Surgeon: Wallace Going, DO;  Location: [place];  Service: Plastics;  Laterality: Bilateral;  . CARPAL TUNNEL RELEASE Right x2  last one 2008  . CHOLECYSTECTOMY  2007  . CYSTO/  URETEROSCOPIC LASER LITHOTRIPSY / STONE EXTRACTION'S/  STENT PLACEMENT  x3  last one 2014  . INCISION AND DRAINAGE OF WOUND Right 08/14/2015   Procedure: Excision of right breast WOUND;  Surgeon: Loel Lofty Dillingham, DO;  Location: [place];  Service: Plastics;  Laterality: Right;  . INCISION AND DRAINAGE OF WOUND Right 09/22/2015   Procedure: IRRIGATION AND DEBRIDEMENT WOUND;  Surgeon: Wallace Going, DO;  Location: [place];  Service: Plastics;  Laterality:  Right;  . INCISION AND DRAINAGE OF WOUND Right 10/30/2015   Procedure: IRRIGATION AND DEBRIDEMENT RIGHT BREAST WOUND;  Surgeon: Loel Lofty Dillingham, DO;  Location: [place];  Service: Plastics;  Laterality: Right;  . LUMBAR FUSION  2008   L1 -- L2  . MASS EXCISION Left 09/22/2015   Procedure: EXCISION LEFT BREAST MASS;  Surgeon: Wallace Going, DO;  Location: [place];  Service: Plastics;  Laterality: Left;  . NEGATIVE SLEEP STUDY  2007  (approx)  per pt  . PARTIAL NEPHRECTOMY Left 1972   "part of kidney rotted"  . RE-EXCISION OF BREAST LUMPECTOMY Left 07/10/2015   Procedure: RE-EXCISION OF LEFT BREAST LUMPECTOMY ;  Surgeon: Judeth Horn, MD;  Location: [place];  Service: General;  Laterality: Left;  . REDUCTION MAMMAPLASTY     bilaterally  . RETINAL DETACHMENT SURGERY Right 2015  [facility]   and Repair macular hole  . SHOULDER ARTHROSCOPY Right 2013  . THYROIDECTOMY Bilateral 2012     SOCIAL HISTORY:  Social History   Socioeconomic History  . Marital status: Married    Spouse name: Not on file  . Number of children:  Not on file  . Years of education: Not on file  . Highest education level: Not on file  Occupational History  . Not on file  Tobacco Use  . Smoking status: Never Smoker  . Smokeless tobacco: Never Used  Substance and Sexual Activity  . Alcohol use: Yes    Comment: rare  . Drug use: No  . Sexual activity: Not on file  Other Topics Concern  . Not on file  Social History Narrative  . Not on file   Social Determinants of Health   Financial Resource Strain:   . Difficulty of Paying Living Expenses: Not on file  Food Insecurity:   . Worried About Charity fundraiser in the Last Year: Not on file  . Ran Out of Food in the Last Year: Not on file  Transportation Needs:   . Lack of Transportation (Medical): Not on file  . Lack of Transportation (Non-Medical): Not on file  Physical Activity:   . Days of Exercise per Week: Not on file  . Minutes of  Exercise per Session: Not on file  Stress:   . Feeling of Stress : Not on file  Social Connections:   . Frequency of Communication with Friends and Family: Not on file  . Frequency of Social Gatherings with Friends and Family: Not on file  . Attends Religious Services: Not on file  . Active Member of Clubs or Organizations: Not on file  . Attends Archivist Meetings: Not on file  . Marital Status: Not on file  Intimate Partner Violence:   . Fear of Current or Ex-Partner: Not on file  . Emotionally Abused: Not on file  . Physically Abused: Not on file  . Sexually Abused: Not on file    FAMILY HISTORY:  Family History  Problem Relation Age of Onset  . Renal cancer Mother   . Breast cancer Sister     CURRENT MEDICATIONS:  Outpatient Encounter Medications as of 02/15/2019  Medication Sig  . ALPHAGAN P 0.1 % SOLN PLACE 1 DROP INTO LEFT EYE 3 TIMES DAILY  . anastrozole (ARIMIDEX) 1 MG tablet Take 1 tablet (1 mg total) by mouth daily.  Marland Kitchen aspirin 81 MG chewable tablet Chew 81 mg by mouth daily.  . Cholecalciferol 25 MCG (1000 UT) tablet Take by mouth.  . ciprofloxacin (CILOXAN) 0.3 % ophthalmic solution Apply to eye.  . diazepam (VALIUM) 10 MG tablet Take 10 mg by mouth daily.   . dorzolamide-timolol (COSOPT) 22.3-6.8 MG/ML ophthalmic solution Apply to eye.  Marland Kitchen FLUoxetine (PROZAC) 20 MG capsule Take 20 mg by mouth every morning.  Marland Kitchen levothyroxine (SYNTHROID, LEVOTHROID) 112 MCG tablet Take by mouth.  . meloxicam (MOBIC) 15 MG tablet Take 15 mg by mouth daily.  . pantoprazole (PROTONIX) 40 MG tablet Take 40 mg by mouth every morning.  . phenazopyridine (PYRIDIUM) 200 MG tablet Take 200 mg by mouth every 8 (eight) hours as needed.  . valACYclovir (VALTREX) 1000 MG tablet    No facility-administered encounter medications on file as of 02/15/2019.    ALLERGIES:  Allergies  Allergen Reactions  . Sulfamethoxazole-Trimethoprim Other (See Comments)    Lactic acid  increase Lactic acid increase Lactic acid increase  . Vancomycin Hives    Other reaction(s): Red Man Syndrome  . Codeine Rash     PHYSICAL EXAM:  ECOG Performance status: ***  There were no vitals filed for this visit. There were no vitals filed for this visit.  Physical Exam  LABORATORY DATA:  I have reviewed the labs as listed.  CBC    Component Value Date/Time   WBC 8.3 01/23/2019 1110   RBC 4.52 01/23/2019 1110   HGB 12.3 01/23/2019 1110   HCT 40.4 01/23/2019 1110   PLT 278 01/23/2019 1110   MCV 89.4 01/23/2019 1110   MCH 27.2 01/23/2019 1110   MCHC 30.4 01/23/2019 1110   RDW 12.8 01/23/2019 1110   LYMPHSABS 2.1 01/23/2019 1110   MONOABS 0.5 01/23/2019 1110   EOSABS 0.4 01/23/2019 1110   BASOSABS 0.0 01/23/2019 1110   CMP Latest Ref Rng & Units 01/23/2019 06/13/2018 06/14/2017  Glucose 70 - 99 mg/dL 117(H) 120(H) 94  BUN 8 - 23 mg/dL '17 14 16  '$ Creatinine 0.44 - 1.00 mg/dL 0.63 0.81 0.80  Sodium 135 - 145 mmol/L 139 140 138  Potassium 3.5 - 5.1 mmol/L 4.1 4.2 4.3  Chloride 98 - 111 mmol/L 100 101 98(L)  CO2 22 - 32 mmol/L 28 29 32  Calcium 8.9 - 10.3 mg/dL 9.4 9.5 10.1  Total Protein 6.5 - 8.1 g/dL 7.0 6.8 7.1  Total Bilirubin 0.3 - 1.2 mg/dL 0.4 0.2(L) 0.6  Alkaline Phos 38 - 126 U/L 53 69 71  AST 15 - 41 U/L '17 17 17  '$ ALT 0 - 44 U/L '14 13 14       '$ DIAGNOSTIC IMAGING:  I have independently reviewed the scans and discussed with the patient.   I have reviewed Francene Finders, NP's note and agree with the documentation.  I personally performed a face-to-face visit, made revisions and my assessment and plan is as follows.    ASSESSMENT & PLAN:   Malignant neoplasm of upper-outer quadrant of left female breast ([place]) 1. 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 -Treated with lumpectomy, bilateral breast reduction surgery. -Mammaprint results showed low risk indicating no  benefit from chemotherapy. -Completed adjuvant breast radiation therapy and even in November 2017. -Anastrozole was started in December 2017.  She is tolerating this very well. -Bilateral diagnostic mammogram done on 06/13/2018 suggested an indeterminate retroareolar left breast mass at 5:30 position. -Breast biopsy performed on 06/20/2018 was negative for malignancy but suggested left breast parenchyma with dense stromal fibrosis. -Patient with intermittent left nipple inversion; likely secondary to fibrosis or scarring. -Patient presented for her doctor's appointment on 01/30/2019 with a 2 cm sized nodule at the 5 o'clock position about 1 inch from the nipple.  There was pain associated with the nodule.  Patient states that the nodule is new. -Patient had an ultrasound on 02/13/2019 which showed BI-RADS Category 2 benign.  Suggested to keep mammogram that is due in 4 months. -We will continue Arimidex at this time. -She will follow up in 4 months with labs after her mammogram.  2.  Osteopenia: -DEXA scan 12/2015 showed T score of -0.9. -DEXA scan on 06/20/2018 showed a T score of -1.2.  This is consistent with osteopenia. -She will continue calcium and vitamin D daily at this time.      Orders placed this encounter:  No orders of the defined types were placed in this encounter.     Derek Jack, MD [place] [phone]

## 2019-04-16 ENCOUNTER — Other Ambulatory Visit (HOSPITAL_COMMUNITY): Payer: Self-pay | Admitting: *Deleted

## 2019-04-16 DIAGNOSIS — C50412 Malignant neoplasm of upper-outer quadrant of left female breast: Secondary | ICD-10-CM

## 2019-04-16 MED ORDER — ANASTROZOLE 1 MG PO TABS: 1.0000 mg | ORAL_TABLET | Freq: Every day | ORAL | 3 refills | Status: DC

## 2019-05-04 ENCOUNTER — Other Ambulatory Visit (HOSPITAL_COMMUNITY): Payer: Self-pay | Admitting: Nurse Practitioner

## 2019-05-04 DIAGNOSIS — C50412 Malignant neoplasm of upper-outer quadrant of left female breast: Secondary | ICD-10-CM

## 2019-06-12 ENCOUNTER — Other Ambulatory Visit: Payer: Self-pay

## 2019-06-12 ENCOUNTER — Ambulatory Visit (HOSPITAL_COMMUNITY)
Admission: RE | Admit: 2019-06-12 | Discharge: 2019-06-12 | Disposition: A | Discharge status: Home / Self Care | Payer: Medicare Other | Source: Ambulatory Visit | Attending: Nurse Practitioner | Admitting: Nurse Practitioner

## 2019-06-12 DIAGNOSIS — C50412 Malignant neoplasm of upper-outer quadrant of left female breast: Secondary | ICD-10-CM

## 2019-08-27 ENCOUNTER — Other Ambulatory Visit (HOSPITAL_COMMUNITY): Payer: Self-pay

## 2019-08-27 DIAGNOSIS — C50412 Malignant neoplasm of upper-outer quadrant of left female breast: Secondary | ICD-10-CM

## 2019-08-27 NOTE — Progress Notes (Signed)
Patient called the clinic reporting that she felt a new nodule present in the L breast. Patient reports no pain or discharge. Orders placed for L mammogram and breast ultrasound. Patient to follow up after imaging has been completed.

## 2019-09-18 ENCOUNTER — Ambulatory Visit (HOSPITAL_COMMUNITY)
Admission: RE | Admit: 2019-09-18 | Discharge: 2019-09-18 | Disposition: A | Discharge status: Home / Self Care | Payer: Medicare Other | Source: Ambulatory Visit | Attending: Nurse Practitioner | Admitting: Nurse Practitioner

## 2019-09-18 ENCOUNTER — Other Ambulatory Visit: Payer: Self-pay

## 2019-09-18 DIAGNOSIS — C50412 Malignant neoplasm of upper-outer quadrant of left female breast: Secondary | ICD-10-CM | POA: Diagnosis present

## 2019-09-18 DIAGNOSIS — Z170 Estrogen receptor positive status [ER+]: Secondary | ICD-10-CM | POA: Insufficient documentation

## 2019-09-20 ENCOUNTER — Ambulatory Visit (HOSPITAL_COMMUNITY): Payer: Medicare Other | Admitting: Nurse Practitioner

## 2020-01-07 ENCOUNTER — Other Ambulatory Visit (HOSPITAL_COMMUNITY): Payer: Self-pay | Admitting: Hematology

## 2020-01-07 DIAGNOSIS — C50412 Malignant neoplasm of upper-outer quadrant of left female breast: Secondary | ICD-10-CM

## 2020-05-22 ENCOUNTER — Other Ambulatory Visit (HOSPITAL_COMMUNITY): Payer: Self-pay | Admitting: Hematology

## 2020-05-22 DIAGNOSIS — Z9889 Other specified postprocedural states: Secondary | ICD-10-CM

## 2020-06-17 ENCOUNTER — Ambulatory Visit (HOSPITAL_COMMUNITY): Payer: Medicare Other

## 2020-06-17 ENCOUNTER — Encounter (HOSPITAL_COMMUNITY): Payer: Medicare Other

## 2020-07-18 MED ORDER — CYANOCOBALAMIN 1000 MCG/ML IJ SOLN
INTRAMUSCULAR | Status: AC
  Filled 2020-07-18: qty 1

## 2020-07-22 ENCOUNTER — Ambulatory Visit (HOSPITAL_COMMUNITY)
Admission: RE | Admit: 2020-07-22 | Discharge: 2020-07-22 | Disposition: A | Discharge status: Home / Self Care | Payer: Medicare Other | Source: Ambulatory Visit | Attending: Hematology | Admitting: Hematology

## 2020-07-22 ENCOUNTER — Other Ambulatory Visit (HOSPITAL_COMMUNITY): Payer: Medicare Other

## 2020-07-22 ENCOUNTER — Other Ambulatory Visit: Payer: Self-pay

## 2020-07-22 DIAGNOSIS — Z9889 Other specified postprocedural states: Secondary | ICD-10-CM

## 2020-07-22 DIAGNOSIS — Z79811 Long term (current) use of aromatase inhibitors: Secondary | ICD-10-CM | POA: Insufficient documentation

## 2020-07-22 DIAGNOSIS — Z853 Personal history of malignant neoplasm of breast: Secondary | ICD-10-CM | POA: Diagnosis not present

## 2020-08-12 ENCOUNTER — Other Ambulatory Visit (HOSPITAL_COMMUNITY): Payer: Self-pay | Admitting: Surgery

## 2020-08-12 DIAGNOSIS — C50412 Malignant neoplasm of upper-outer quadrant of left female breast: Secondary | ICD-10-CM

## 2020-08-13 ENCOUNTER — Inpatient Hospital Stay (HOSPITAL_COMMUNITY): Payer: Medicare Other | Attending: Hematology

## 2020-08-13 ENCOUNTER — Other Ambulatory Visit: Payer: Self-pay

## 2020-08-13 DIAGNOSIS — Z79811 Long term (current) use of aromatase inhibitors: Secondary | ICD-10-CM | POA: Diagnosis not present

## 2020-08-13 DIAGNOSIS — Z9221 Personal history of antineoplastic chemotherapy: Secondary | ICD-10-CM | POA: Insufficient documentation

## 2020-08-13 DIAGNOSIS — C50412 Malignant neoplasm of upper-outer quadrant of left female breast: Secondary | ICD-10-CM | POA: Insufficient documentation

## 2020-08-13 DIAGNOSIS — M8580 Other specified disorders of bone density and structure, unspecified site: Secondary | ICD-10-CM | POA: Insufficient documentation

## 2020-08-13 DIAGNOSIS — Z9012 Acquired absence of left breast and nipple: Secondary | ICD-10-CM | POA: Insufficient documentation

## 2020-08-13 DIAGNOSIS — Z170 Estrogen receptor positive status [ER+]: Secondary | ICD-10-CM | POA: Diagnosis not present

## 2020-08-13 DIAGNOSIS — Z923 Personal history of irradiation: Secondary | ICD-10-CM | POA: Diagnosis not present

## 2020-08-13 LAB — CBC WITH DIFFERENTIAL/PLATELET
Abs Immature Granulocytes: 0.03 10*3/uL (ref 0.00–0.07)
Basophils Absolute: 0 10*3/uL (ref 0.0–0.1)
Basophils Relative: 0 %
Eosinophils Absolute: 0.2 10*3/uL (ref 0.0–0.5)
Eosinophils Relative: 4 %
HCT: 39.3 % (ref 36.0–46.0)
Hemoglobin: 12.3 g/dL (ref 12.0–15.0)
Immature Granulocytes: 1 %
Lymphocytes Relative: 38 %
Lymphs Abs: 2.3 10*3/uL (ref 0.7–4.0)
MCH: 27 pg (ref 26.0–34.0)
MCHC: 31.3 g/dL (ref 30.0–36.0)
MCV: 86.4 fL (ref 80.0–100.0)
Monocytes Absolute: 0.4 10*3/uL (ref 0.1–1.0)
Monocytes Relative: 7 %
Neutro Abs: 3.2 10*3/uL (ref 1.7–7.7)
Neutrophils Relative %: 50 %
Platelets: 261 10*3/uL (ref 150–400)
RBC: 4.55 MIL/uL (ref 3.87–5.11)
RDW: 14.3 % (ref 11.5–15.5)
WBC: 6.2 10*3/uL (ref 4.0–10.5)
nRBC: 0 % (ref 0.0–0.2)

## 2020-08-13 LAB — VITAMIN D 25 HYDROXY (VIT D DEFICIENCY, FRACTURES): Vit D, 25-Hydroxy: 54.34 ng/mL (ref 30–100)

## 2020-08-13 LAB — COMPREHENSIVE METABOLIC PANEL
ALT: 19 U/L (ref 0–44)
AST: 22 U/L (ref 15–41)
Albumin: 4 g/dL (ref 3.5–5.0)
Alkaline Phosphatase: 52 U/L (ref 38–126)
Anion gap: 10 (ref 5–15)
BUN: 14 mg/dL (ref 8–23)
CO2: 28 mmol/L (ref 22–32)
Calcium: 9 mg/dL (ref 8.9–10.3)
Chloride: 101 mmol/L (ref 98–111)
Creatinine, Ser: 0.8 mg/dL (ref 0.44–1.00)
GFR, Estimated: >60 mL/min (ref >60)
Glucose, Bld: 88 mg/dL (ref 70–99)
Potassium: 4.2 mmol/L (ref 3.5–5.1)
Sodium: 139 mmol/L (ref 135–145)
Total Bilirubin: 0.5 mg/dL (ref 0.3–1.2)
Total Protein: 6.8 g/dL (ref 6.5–8.1)

## 2020-08-20 ENCOUNTER — Ambulatory Visit (HOSPITAL_COMMUNITY): Payer: Medicare Other | Admitting: Hematology and Oncology

## 2020-08-21 ENCOUNTER — Other Ambulatory Visit (HOSPITAL_COMMUNITY): Payer: Self-pay | Admitting: Nurse Practitioner

## 2020-08-21 ENCOUNTER — Other Ambulatory Visit (HOSPITAL_COMMUNITY): Payer: Self-pay

## 2020-08-21 ENCOUNTER — Other Ambulatory Visit: Payer: Self-pay

## 2020-08-21 ENCOUNTER — Inpatient Hospital Stay (HOSPITAL_BASED_OUTPATIENT_CLINIC_OR_DEPARTMENT_OTHER): Payer: Medicare Other | Admitting: Nurse Practitioner

## 2020-08-21 VITALS — BP 142/73 | HR 62 | Temp 96.7°F | Resp 18 | Wt 261.7 lb

## 2020-08-21 DIAGNOSIS — Z79811 Long term (current) use of aromatase inhibitors: Secondary | ICD-10-CM | POA: Diagnosis not present

## 2020-08-21 DIAGNOSIS — C50412 Malignant neoplasm of upper-outer quadrant of left female breast: Secondary | ICD-10-CM

## 2020-08-21 DIAGNOSIS — M8580 Other specified disorders of bone density and structure, unspecified site: Secondary | ICD-10-CM | POA: Insufficient documentation

## 2020-08-21 DIAGNOSIS — Z170 Estrogen receptor positive status [ER+]: Secondary | ICD-10-CM

## 2020-08-21 DIAGNOSIS — Z853 Personal history of malignant neoplasm of breast: Secondary | ICD-10-CM | POA: Insufficient documentation

## 2020-08-21 DIAGNOSIS — Z1231 Encounter for screening mammogram for malignant neoplasm of breast: Secondary | ICD-10-CM

## 2020-08-21 MED ORDER — MISC. DEVICES MISC: 0 refills | Status: AC

## 2020-08-21 MED ORDER — ANASTROZOLE 1 MG PO TABS: 1.0000 mg | ORAL_TABLET | Freq: Every day | ORAL | 3 refills | Status: DC

## 2020-08-21 MED ORDER — MISC. DEVICES MISC: 0 refills | Status: DC

## 2020-08-21 NOTE — Progress Notes (Signed)
[place], [place] [address]   CLINIC:  Medical Oncology/Hematology  PCP:  [name], MD [facility] and [place] [address] [phone]   REASON FOR VISIT: Follow-up for breast cancer  CURRENT THERAPY: Arimidex  BRIEF ONCOLOGIC HISTORY:  Oncology History  Breast cancer, left breast ([place])  05/05/2015 Procedure   Left needle core biopsy   05/05/2015 Pathology Results   Breast, left, needle core biopsy, 2:00 o'clock - INVASIVE AND IN SITU MAMMARY CARCINOMA WITH CALCIFICATIONS.   06/03/2015 Procedure   Left breast biopsy   06/03/2015 Pathology Results   Breast, left, needle core biopsy, central - ATYPICAL DUCTAL HYPERPLASIA.   06/17/2015 Pathology Results   Breast, partial mastectomy, Left, upper outer quadrant with seed and needle loc - MULTIFOCAL INVASIVE GRADE II LOBULAR CARCINOMA, TWO FOCI MEASURING 1.8 CM AND 1.7 CM IN GREATEST DIMENSION.   06/17/2015 Procedure   Left partial mastectomy by Dr. Judeth Horn   06/17/2015 Initial Diagnosis   Breast cancer ([place])   07/10/2015 Procedure   Left breast re-excision by Dr. Marla Roe   07/10/2015 Pathology Results   Breast, excision, Left Re-excision - BENIGN BREAST TISSUE WITH BIOPSY CHANGES. - NO RESIDUAL TUMOR. - FINAL MARGINS CLEAR.Breast, left, needle core biopsy, outer - INVASIVE LOBULAR CARCINOMA.   08/01/2015 - 08/04/2015 Hospital Admission   Status post bilateral breast reduction   Status post partial mastectomy of left breast   Open wound of right breast   Open breast wound   08/11/2015 Miscellaneous   Mammaprint LOW RISK: No Potential Significant Chemotherapy Benefit   11/11/2015 - 12/15/2015 Radiation Therapy   Adjuvant radiation therapy; [place]; Left breast and regional LN to 50.4 GY/1.8 Gy       01/05/2016 Imaging   Bone density- BMD as determined from [facility] L1-L2 is 1.063 g/cm2 with a T-Score of -0.9. This patient is  considered normal according to [facility]) criteria.   12/2015 -  Anti-estrogen oral therapy   Anastrozole daily       CANCER STAGING: Cancer Staging Breast cancer, left breast [facility]) Staging form: Breast, AJCC 7th Edition - Pathologic stage from 06/17/2015: Stage IIA (T1c(2), N1a, cM0) - Signed by Baird Cancer, PA-C on 08/10/2015    INTERVAL HISTORY:  Colleen Cohen 70 y.o. female currently on anastrozole for her above history of breast cancer who returns to clinic for follow-up.  She continues to tolerate anastrozole well.  No lumps or bumps.  Denies interval infections.  No fevers or chills.  No nausea, vomiting, constipation, diarrhea.  No bone pain.  No chest pain or shortness of breath.  No neurologic complaints.  No urinary complaints.  Appetite is good and her weight is stable.  No other specific complaints today.  Patient with history of anastrozole and prescription for prosthesis bras.   REVIEW OF SYSTEMS:  Review of Systems  Constitutional:  Negative for appetite change, fatigue and unexpected weight change.  HENT:   Negative for mouth sores, sore throat and trouble swallowing.   Respiratory:  Positive for shortness of breath (with exertion). Negative for chest tightness.   Cardiovascular:  Negative for leg swelling.  Gastrointestinal:  Negative for abdominal pain, constipation, diarrhea, nausea and vomiting.  Genitourinary:  Negative for bladder incontinence and dysuria.   Musculoskeletal:  Negative for flank pain and neck stiffness.  Skin:  Negative for itching, rash and wound.  Neurological:  Positive for numbness. Negative for dizziness, headaches  and light-headedness.  Psychiatric/Behavioral:  Positive for depression. Negative for confusion and sleep disturbance. The patient is nervous/anxious.   All other systems reviewed and are negative.   PAST MEDICAL/SURGICAL HISTORY:  Past Medical History:  Diagnosis Date   Anxiety    Arthritis    Breast  cancer [facility]) oncologist-  shannon penland ([facility] and Dr Sondra Come    dx 04/ 2017-- left breast lobular multifocal carcinoma x2 sites-- Stage 2 w/ mets x1 positive node--  ER/PR+,  HER2 negative --  s/p  left breast lumpectomy with radioactive seed implants and re-excision for positive margin 06-21-2015   Depression    GERD (gastroesophageal reflux disease)    History of kidney stones    Hypertension    Hypothyroidism, postsurgical    Migraine    Myopia of both eyes    Nephrolithiasis    PONV (postoperative nausea and vomiting)    Wears dentures    Wears glasses    Wound of right breast    Past Surgical History:  Procedure Laterality Date   APPLICATION OF A-CELL OF CHEST/ABDOMEN Right 10/30/2015   Procedure: APPLICATION OF A-CELL RIGHT BREAST WOUND;  Surgeon: Loel Lofty Dillingham, DO;  Location: [place];  Service: Plastics;  Laterality: Right;   APPLICATION OF WOUND VAC Right 08/14/2015   Procedure: APPLICATION OF WOUND VAC;  Surgeon: Loel Lofty Dillingham, DO;  Location: [place];  Service: Plastics;  Laterality: Right;   BREAST LUMPECTOMY WITH RADIOACTIVE SEED AND SENTINEL LYMPH NODE BIOPSY Left 06/17/2015   Procedure: LEFT BREAST LUMPECTOMY WITH RADIOACTIVE SEED AND SENTINEL LYMPH NODE BIOPSY, NEEDLE LOCALIZATION IN ATYPICAL DUCTAL HYPERPLASIA  AREA OF LEFT BREAST;  Surgeon: Judeth Horn, MD;  Location: [place];  Service: General;  Laterality: Left;   BREAST RECONSTRUCTION Left 07/10/2015   Procedure: REVISION OF LEFT BREAST REDUCTION;  Surgeon: Loel Lofty Dillingham, DO;  Location: [place];  Service: Plastics;  Laterality: Left;   BREAST REDUCTION SURGERY Bilateral 06/17/2015   Procedure: BILATERAL BREAST  REDUCTION  WITH RECONSTRUCTION  ON LEFT  AFTER PARTIAL MASTECTOMY;  Surgeon: Wallace Going, DO;  Location: [place];  Service: Plastics;  Laterality: Bilateral;   CARPAL TUNNEL RELEASE Right x2  last one 2008   CHOLECYSTECTOMY  2007    CYSTO/  URETEROSCOPIC LASER LITHOTRIPSY / STONE EXTRACTION'S/  STENT PLACEMENT  x3  last one 2014   INCISION AND DRAINAGE OF WOUND Right 08/14/2015   Procedure: Excision of right breast WOUND;  Surgeon: Loel Lofty Dillingham, DO;  Location: [place];  Service: Plastics;  Laterality: Right;   INCISION AND DRAINAGE OF WOUND Right 09/22/2015   Procedure: IRRIGATION AND DEBRIDEMENT WOUND;  Surgeon: Wallace Going, DO;  Location: [place];  Service: Plastics;  Laterality: Right;   INCISION AND DRAINAGE OF WOUND Right 10/30/2015   Procedure: IRRIGATION AND DEBRIDEMENT RIGHT BREAST WOUND;  Surgeon: Loel Lofty Dillingham, DO;  Location: [place];  Service: Plastics;  Laterality: Right;   LUMBAR FUSION  2008   L1 -- L2   MASS EXCISION Left 09/22/2015   Procedure: EXCISION LEFT BREAST MASS;  Surgeon: Wallace Going, DO;  Location: [place];  Service: Plastics;  Laterality: Left;   NEGATIVE SLEEP STUDY  2007  (approx)  per pt   PARTIAL NEPHRECTOMY Left 1972   "part of kidney rotted"   RE-EXCISION OF BREAST LUMPECTOMY Left 07/10/2015   Procedure: RE-EXCISION OF LEFT BREAST LUMPECTOMY ;  Surgeon:  Judeth Horn, MD;  Location: [place];  Service: General;  Laterality: Left;   REDUCTION MAMMAPLASTY     bilaterally   RETINAL DETACHMENT SURGERY Right 2015  [facility]   and Repair macular hole   SHOULDER ARTHROSCOPY Right 2013   THYROIDECTOMY Bilateral 2012     SOCIAL HISTORY:  Social History   Socioeconomic History   Marital status: Married    Spouse name: Not on file   Number of children: Not on file   Years of education: Not on file   Highest education level: Not on file  Occupational History   Not on file  Tobacco Use   Smoking status: Never   Smokeless tobacco: Never  Substance and Sexual Activity   Alcohol use: Yes    Comment: rare   Drug use: No   Sexual activity: Not on file  Other Topics Concern   Not on file  Social History Narrative    Not on file   Social Determinants of Health   Financial Resource Strain: Not on file  Food Insecurity: Not on file  Transportation Needs: Not on file  Physical Activity: Not on file  Stress: Not on file  Social Connections: Not on file  Intimate Partner Violence: Not on file    FAMILY HISTORY:  Family History  Problem Relation Age of Onset   Renal cancer Mother    Breast cancer Sister     CURRENT MEDICATIONS:  Outpatient Encounter Medications as of 08/21/2020  Medication Sig   ALPHAGAN P 0.1 % SOLN PLACE 1 DROP INTO LEFT EYE 3 TIMES DAILY   anastrozole (ARIMIDEX) 1 MG tablet TAKE 1 TABLET BY MOUTH EVERY DAY   apraclonidine (IOPIDINE) 0.5 % ophthalmic solution Apply to eye.   aspirin 81 MG chewable tablet Chew 81 mg by mouth daily.   Cholecalciferol 25 MCG (1000 UT) tablet Take by mouth.   ciprofloxacin (CILOXAN) 0.3 % ophthalmic solution Apply to eye.   diazepam (VALIUM) 10 MG tablet Take 10 mg by mouth daily.    dorzolamide-timolol (COSOPT) 22.3-6.8 MG/ML ophthalmic solution SMARTSIG:In Eye(s)   Fluoxetine HCl, PMDD, 20 MG TABS Take by mouth.   levothyroxine (SYNTHROID, LEVOTHROID) 112 MCG tablet Take by mouth.   meloxicam (MOBIC) 15 MG tablet Take 15 mg by mouth daily.   metoprolol succinate (TOPROL-XL) 50 MG 24 hr tablet Take 50 mg by mouth daily.   Netarsudil Dimesylate 0.02 % SOLN Apply to eye.   pantoprazole (PROTONIX) 40 MG tablet Take 40 mg by mouth every morning.   phenazopyridine (PYRIDIUM) 200 MG tablet Take 200 mg by mouth every 8 (eight) hours as needed.   valACYclovir (VALTREX) 1000 MG tablet    [DISCONTINUED] dorzolamide-timolol (COSOPT) 22.3-6.8 MG/ML ophthalmic solution INSTILL 1 DROP INTO LEFT EYE TWICE A DAY   [DISCONTINUED] FLUoxetine (PROZAC) 20 MG capsule Take 20 mg by mouth every morning.   No facility-administered encounter medications on file as of 08/21/2020.    ALLERGIES:  Allergies  Allergen Reactions   Sulfamethoxazole-Trimethoprim Other (See  Comments)    Lactic acid increase Lactic acid increase Lactic acid increase   Vancomycin Hives    Other reaction(s): Red Man Syndrome   Codeine Rash     PHYSICAL EXAM:  ECOG Performance status: 0  Vitals:   08/21/20 1142  BP: (!) 142/73  Pulse: 62  Resp: 18  Temp: (!) 96.7 F (35.9 C)  SpO2: 94%   Filed Weights   08/21/20 1142  Weight: 261 lb 11 oz (118.7 kg)  Physical Exam Constitutional:      General: She is not in acute distress.    Appearance: She is well-developed. She is not ill-appearing.  HENT:     Head: Atraumatic.     Nose: Nose normal.     Mouth/Throat:     Pharynx: No oropharyngeal exudate.  Eyes:     General: No scleral icterus.    Conjunctiva/sclera: Conjunctivae normal.  Cardiovascular:     Rate and Rhythm: Normal rate and regular rhythm.     Pulses: Normal pulses.  Pulmonary:     Effort: Pulmonary effort is normal.     Breath sounds: Normal breath sounds.  Abdominal:     General: Bowel sounds are normal. There is no distension.     Palpations: Abdomen is soft.     Tenderness: There is no abdominal tenderness.  Musculoskeletal:        General: No tenderness or deformity. Normal range of motion.     Cervical back: Normal range of motion and neck supple.  Skin:    General: Skin is warm and dry.     Coloration: Skin is not pale.  Neurological:     General: No focal deficit present.     Mental Status: She is alert and oriented to person, place, and time.  Psychiatric:        Mood and Affect: Mood normal.        Behavior: Behavior normal.  Breast: deferred    LABORATORY DATA:  I have reviewed the labs as listed.  CBC    Component Value Date/Time   WBC 6.2 08/13/2020 1338   RBC 4.55 08/13/2020 1338   HGB 12.3 08/13/2020 1338   HCT 39.3 08/13/2020 1338   PLT 261 08/13/2020 1338   MCV 86.4 08/13/2020 1338   MCH 27.0 08/13/2020 1338   MCHC 31.3 08/13/2020 1338   RDW 14.3 08/13/2020 1338   LYMPHSABS 2.3 08/13/2020 1338   MONOABS  0.4 08/13/2020 1338   EOSABS 0.2 08/13/2020 1338   BASOSABS 0.0 08/13/2020 1338   CMP Latest Ref Rng & Units 08/13/2020 01/23/2019 06/13/2018  Glucose 70 - 99 mg/dL 88 117(H) 120(H)  BUN 8 - 23 mg/dL _0 Creatinine 0.44 - 1.00 mg/dL 0.80 0.63 0.81  Sodium 135 - 145 mmol/L 139 139 140  Potassium 3.5 - 5.1 mmol/L 4.2 4.1 4.2  Chloride 98 - 111 mmol/L 101 100 101  CO2 22 - 32 mmol/L _1 Calcium 8.9 - 10.3 mg/dL 9.0 9.4 9.5  Total Protein 6.5 - 8.1 g/dL 6.8 7.0 6.8  Total Bilirubin 0.3 - 1.2 mg/dL 0.5 0.4 0.2(L)  Alkaline Phos 38 - 126 U/L 52 53 69  AST 15 - 41 U/L _2 ALT 0 - 44 U/L _3 DIAGNOSTIC IMAGING:  I independently reviewed below imaging and agree with findings as stated:- 07/22/2020-mammogram diagnostic breast Tomo bilateral- No suspicious masses in either breast.  Stable lumpectomy changes.  No mammographic evidence of malignancy in either breast.  Recommend screening mammogram in 1 year.     ASSESSMENT & PLAN:   1. 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  Treated with lumpectomy and bilateral breast.  Mammoprint low risk, no benefit from chemotherapy.  She completed adjuvant breast radiation November 2017.  Started anastrozole December 2017.  Mammogram 07/22/2020 was independently reviewed and I agree with stable lumpectomy changes in the left breast.  No mammographic evidence of malignancy in either breast.  Recommend screening mammogram in 1 year.  She has now completed 5 years of AI treatment.  Lengthy discussion regarding stopping aromatase inhibitor versus extended treatment course.  Also offered breast cancer index testing to evaluate benefit of extended aromatase inhibitor use.  Patient is tolerating AI well without significant side effects and would like to continue at this time.  Can reevaluate yearly.  We will refill anastrozole today.  2.  Osteopenia: DEXA scan in 2017 was  normal.  DEXA scan in 2020 showed T score -1.2 consistent with osteopenia.  She has been taking vitamin D but not calcium.  Encouraged calcium 1200 mg and vitamin D at least 1000 units daily along with weightbearing exercise as tolerated.  In setting of aromatase inhibitor use I recommend repeating bone density scan.  If worsening to osteoporosis recommend oral versus injectable treatments.  3. Prosthesis- prescription for bras provided today.   Disposition:  Bone density scan Mammogram June 2023 RTC in 1 year for follow-up.   No problem-specific Assessment & Plan notes found for this encounter.     Orders placed this encounter:  Orders Placed This Encounter  Procedures   MM DIAG BREAST TOMO BILATERAL   DG Bone Density     Beckey Rutter, [place], AGNP-C [place] [phone]

## 2020-09-01 ENCOUNTER — Ambulatory Visit (HOSPITAL_COMMUNITY)
Admission: RE | Admit: 2020-09-01 | Discharge: 2020-09-01 | Disposition: A | Discharge status: Home / Self Care | Payer: Medicare Other | Source: Ambulatory Visit | Attending: Nurse Practitioner | Admitting: Nurse Practitioner

## 2020-09-01 ENCOUNTER — Other Ambulatory Visit: Payer: Self-pay

## 2020-09-01 DIAGNOSIS — Z853 Personal history of malignant neoplasm of breast: Secondary | ICD-10-CM | POA: Insufficient documentation

## 2020-09-01 DIAGNOSIS — M8580 Other specified disorders of bone density and structure, unspecified site: Secondary | ICD-10-CM

## 2020-09-01 DIAGNOSIS — Z79811 Long term (current) use of aromatase inhibitors: Secondary | ICD-10-CM

## 2020-09-01 DIAGNOSIS — Z780 Asymptomatic menopausal state: Secondary | ICD-10-CM | POA: Insufficient documentation

## 2020-09-01 DIAGNOSIS — Z13820 Encounter for screening for osteoporosis: Secondary | ICD-10-CM | POA: Diagnosis not present

## 2020-09-01 DIAGNOSIS — M8589 Other specified disorders of bone density and structure, multiple sites: Secondary | ICD-10-CM | POA: Insufficient documentation

## 2020-09-22 ENCOUNTER — Encounter (HOSPITAL_COMMUNITY): Payer: Self-pay

## 2020-09-22 NOTE — Progress Notes (Signed)
Patient called and made aware of DEXA scan results per Beckey Rutter, NP. Patient verbalized understanding.

## 2021-07-24 ENCOUNTER — Ambulatory Visit (HOSPITAL_COMMUNITY): Payer: Medicare Other

## 2021-08-10 ENCOUNTER — Ambulatory Visit (HOSPITAL_COMMUNITY): Payer: Medicare Other

## 2021-08-24 ENCOUNTER — Other Ambulatory Visit (HOSPITAL_COMMUNITY): Payer: Self-pay | Admitting: Hematology

## 2021-08-24 DIAGNOSIS — Z1231 Encounter for screening mammogram for malignant neoplasm of breast: Secondary | ICD-10-CM

## 2021-08-25 ENCOUNTER — Inpatient Hospital Stay (HOSPITAL_COMMUNITY): Payer: Medicare Other

## 2021-09-01 ENCOUNTER — Ambulatory Visit (HOSPITAL_COMMUNITY): Payer: Medicare Other | Admitting: Hematology

## 2021-10-19 ENCOUNTER — Inpatient Hospital Stay: Payer: Medicare Other

## 2021-10-19 ENCOUNTER — Ambulatory Visit (HOSPITAL_COMMUNITY): Payer: Medicare Other

## 2021-10-21 ENCOUNTER — Ambulatory Visit (HOSPITAL_COMMUNITY): Payer: Self-pay

## 2021-10-21 ENCOUNTER — Inpatient Hospital Stay: Payer: Medicare Other

## 2021-10-26 ENCOUNTER — Inpatient Hospital Stay: Payer: Medicare Other | Admitting: Hematology

## 2021-10-29 ENCOUNTER — Other Ambulatory Visit: Payer: Self-pay

## 2021-10-29 DIAGNOSIS — Z79811 Long term (current) use of aromatase inhibitors: Secondary | ICD-10-CM

## 2021-10-29 DIAGNOSIS — Z170 Estrogen receptor positive status [ER+]: Secondary | ICD-10-CM

## 2021-10-29 DIAGNOSIS — M8580 Other specified disorders of bone density and structure, unspecified site: Secondary | ICD-10-CM

## 2021-10-29 DIAGNOSIS — E559 Vitamin D deficiency, unspecified: Secondary | ICD-10-CM

## 2021-10-29 DIAGNOSIS — Z853 Personal history of malignant neoplasm of breast: Secondary | ICD-10-CM

## 2021-10-30 ENCOUNTER — Inpatient Hospital Stay: Payer: Medicare Other

## 2021-10-30 ENCOUNTER — Ambulatory Visit (HOSPITAL_COMMUNITY): Payer: Self-pay

## 2021-11-10 ENCOUNTER — Ambulatory Visit: Payer: Medicare Other | Admitting: Hematology

## 2021-11-16 ENCOUNTER — Ambulatory Visit (HOSPITAL_COMMUNITY)
Admission: RE | Admit: 2021-11-16 | Discharge: 2021-11-16 | Disposition: A | Discharge status: Home / Self Care | Payer: Medicare Other | Source: Ambulatory Visit | Attending: Hematology | Admitting: Hematology

## 2021-11-16 ENCOUNTER — Inpatient Hospital Stay: Payer: Medicare Other | Attending: Hematology

## 2021-11-16 DIAGNOSIS — Z9012 Acquired absence of left breast and nipple: Secondary | ICD-10-CM | POA: Diagnosis not present

## 2021-11-16 DIAGNOSIS — Z79811 Long term (current) use of aromatase inhibitors: Secondary | ICD-10-CM | POA: Diagnosis not present

## 2021-11-16 DIAGNOSIS — Z923 Personal history of irradiation: Secondary | ICD-10-CM | POA: Insufficient documentation

## 2021-11-16 DIAGNOSIS — Z1231 Encounter for screening mammogram for malignant neoplasm of breast: Secondary | ICD-10-CM | POA: Insufficient documentation

## 2021-11-16 DIAGNOSIS — C50412 Malignant neoplasm of upper-outer quadrant of left female breast: Secondary | ICD-10-CM | POA: Insufficient documentation

## 2021-11-16 DIAGNOSIS — E559 Vitamin D deficiency, unspecified: Secondary | ICD-10-CM

## 2021-11-16 DIAGNOSIS — M8580 Other specified disorders of bone density and structure, unspecified site: Secondary | ICD-10-CM | POA: Insufficient documentation

## 2021-11-16 DIAGNOSIS — Z170 Estrogen receptor positive status [ER+]: Secondary | ICD-10-CM | POA: Diagnosis not present

## 2021-11-16 LAB — VITAMIN D 25 HYDROXY (VIT D DEFICIENCY, FRACTURES): Vit D, 25-Hydroxy: 27.89 ng/mL — ABNORMAL LOW (ref 30–100)

## 2021-11-16 LAB — COMPREHENSIVE METABOLIC PANEL
ALT: 14 U/L (ref 0–44)
AST: 26 U/L (ref 15–41)
Albumin: 4.1 g/dL (ref 3.5–5.0)
Alkaline Phosphatase: 51 U/L (ref 38–126)
Anion gap: 11 (ref 5–15)
BUN: 13 mg/dL (ref 8–23)
CO2: 27 mmol/L (ref 22–32)
Calcium: 9.4 mg/dL (ref 8.9–10.3)
Chloride: 100 mmol/L (ref 98–111)
Creatinine, Ser: 0.95 mg/dL (ref 0.44–1.00)
GFR, Estimated: >60 mL/min (ref >60)
Glucose, Bld: 131 mg/dL — ABNORMAL HIGH (ref 70–99)
Potassium: 3.7 mmol/L (ref 3.5–5.1)
Sodium: 138 mmol/L (ref 135–145)
Total Bilirubin: 0.4 mg/dL (ref 0.3–1.2)
Total Protein: 7.3 g/dL (ref 6.5–8.1)

## 2021-11-16 LAB — CBC WITH DIFFERENTIAL/PLATELET
Abs Immature Granulocytes: 0.03 10*3/uL (ref 0.00–0.07)
Basophils Absolute: 0 10*3/uL (ref 0.0–0.1)
Basophils Relative: 0 %
Eosinophils Absolute: 0.2 10*3/uL (ref 0.0–0.5)
Eosinophils Relative: 4 %
HCT: 41.1 % (ref 36.0–46.0)
Hemoglobin: 12.8 g/dL (ref 12.0–15.0)
Immature Granulocytes: 0 %
Lymphocytes Relative: 37 %
Lymphs Abs: 2.5 10*3/uL (ref 0.7–4.0)
MCH: 26 pg (ref 26.0–34.0)
MCHC: 31.1 g/dL (ref 30.0–36.0)
MCV: 83.5 fL (ref 80.0–100.0)
Monocytes Absolute: 0.4 10*3/uL (ref 0.1–1.0)
Monocytes Relative: 5 %
Neutro Abs: 3.6 10*3/uL (ref 1.7–7.7)
Neutrophils Relative %: 54 %
Platelets: 269 10*3/uL (ref 150–400)
RBC: 4.92 MIL/uL (ref 3.87–5.11)
RDW: 14.2 % (ref 11.5–15.5)
WBC: 6.8 10*3/uL (ref 4.0–10.5)
nRBC: 0 % (ref 0.0–0.2)

## 2021-11-23 ENCOUNTER — Inpatient Hospital Stay (HOSPITAL_BASED_OUTPATIENT_CLINIC_OR_DEPARTMENT_OTHER): Payer: Medicare Other | Admitting: Hematology

## 2021-11-23 VITALS — BP 153/83 | HR 74 | Temp 98.2°F | Resp 18 | Ht 67.0 in | Wt 245.8 lb

## 2021-11-23 DIAGNOSIS — C50412 Malignant neoplasm of upper-outer quadrant of left female breast: Secondary | ICD-10-CM

## 2021-11-23 DIAGNOSIS — Z170 Estrogen receptor positive status [ER+]: Secondary | ICD-10-CM

## 2021-11-23 DIAGNOSIS — Z1231 Encounter for screening mammogram for malignant neoplasm of breast: Secondary | ICD-10-CM

## 2021-11-23 DIAGNOSIS — E559 Vitamin D deficiency, unspecified: Secondary | ICD-10-CM

## 2021-11-23 DIAGNOSIS — Z79811 Long term (current) use of aromatase inhibitors: Secondary | ICD-10-CM

## 2021-11-23 NOTE — Progress Notes (Signed)
[name], [place] [address]   CLINIC:  Medical Oncology/Hematology  PCP:  [name], MD [facility] and [place] [id number] [phone]   REASON FOR VISIT: Follow-up for breast cancer  CURRENT THERAPY: Arimidex  BRIEF ONCOLOGIC HISTORY:  Oncology History  Breast cancer, left breast ([place])  05/05/2015 Procedure   Left needle core biopsy   05/05/2015 Pathology Results   Breast, left, needle core biopsy, 2:00 o'clock - INVASIVE AND IN SITU MAMMARY CARCINOMA WITH CALCIFICATIONS.   06/03/2015 Procedure   Left breast biopsy   06/03/2015 Pathology Results   Breast, left, needle core biopsy, central - ATYPICAL DUCTAL HYPERPLASIA.   06/17/2015 Pathology Results   Breast, partial mastectomy, Left, upper outer quadrant with seed and needle loc - MULTIFOCAL INVASIVE GRADE II LOBULAR CARCINOMA, TWO FOCI MEASURING 1.8 CM AND 1.7 CM IN GREATEST DIMENSION.   06/17/2015 Procedure   Left partial mastectomy by Dr. Judeth Horn   06/17/2015 Initial Diagnosis   Breast cancer ([place])   07/10/2015 Procedure   Left breast re-excision by Dr. Marla Roe   07/10/2015 Pathology Results   Breast, excision, Left Re-excision - BENIGN BREAST TISSUE WITH BIOPSY CHANGES. - NO RESIDUAL TUMOR. - FINAL MARGINS CLEAR.Breast, left, needle core biopsy, outer - INVASIVE LOBULAR CARCINOMA.   08/01/2015 - 08/04/2015 Hospital Admission   Status post bilateral breast reduction   Status post partial mastectomy of left breast   Open wound of right breast   Open breast wound   08/11/2015 Miscellaneous   Mammaprint LOW RISK: No Potential Significant Chemotherapy Benefit   11/11/2015 - 12/15/2015 Radiation Therapy   Adjuvant radiation therapy; [place]; Left breast and regional LN to 50.4 GY/1.8 Gy       01/05/2016 Imaging   Bone density- BMD as determined from [facility] L1-L2 is 1.063 g/cm2 with a T-Score of -0.9. This patient is  considered normal according to [facility]) criteria.   12/2015 -  Anti-estrogen oral therapy   Anastrozole daily       CANCER STAGING:  Cancer Staging  Breast cancer, left breast [facility]) Staging form: Breast, AJCC 7th Edition - Pathologic stage from 06/17/2015: Stage IIA (T1c(2), N1a, cM0) - Signed by Baird Cancer, PA-C on 08/10/2015    INTERVAL HISTORY:  Colleen Cohen 71 y.o. female seen for follow-up of left breast cancer.  She is tolerating anastrozole very well.  Energy levels are reported as 40-50%.  Denies any new onset pains.  REVIEW OF SYSTEMS:  Review of Systems  Respiratory:  Positive for shortness of breath (with exertion).   Neurological:  Positive for dizziness and headaches. Negative for light-headedness.  Psychiatric/Behavioral:  Negative for confusion and sleep disturbance.   All other systems reviewed and are negative.    PAST MEDICAL/SURGICAL HISTORY:  Past Medical History:  Diagnosis Date   Anxiety    Arthritis    Breast cancer [facility]) oncologist-  shannon penland ([facility] and Dr Sondra Come    dx 04/ 2017-- left breast lobular multifocal carcinoma x2 sites-- Stage 2 w/ mets x1 positive node--  ER/PR+,  HER2 negative --  s/p  left breast lumpectomy with radioactive seed implants and re-excision for positive margin 06-21-2015   Depression    GERD (gastroesophageal reflux disease)    History of kidney stones    Hypertension    Hypothyroidism, postsurgical    Migraine    Myopia of both eyes    Nephrolithiasis  PONV (postoperative nausea and vomiting)    Wears dentures    Wears glasses    Wound of right breast    Past Surgical History:  Procedure Laterality Date   APPLICATION OF A-CELL OF CHEST/ABDOMEN Right 10/30/2015   Procedure: APPLICATION OF A-CELL RIGHT BREAST WOUND;  Surgeon: Loel Lofty Dillingham, DO;  Location: [place];  Service: Plastics;  Laterality: Right;   APPLICATION OF WOUND VAC Right 08/14/2015   Procedure:  APPLICATION OF WOUND VAC;  Surgeon: Loel Lofty Dillingham, DO;  Location: [place];  Service: Plastics;  Laterality: Right;   BREAST LUMPECTOMY WITH RADIOACTIVE SEED AND SENTINEL LYMPH NODE BIOPSY Left 06/17/2015   Procedure: LEFT BREAST LUMPECTOMY WITH RADIOACTIVE SEED AND SENTINEL LYMPH NODE BIOPSY, NEEDLE LOCALIZATION IN ATYPICAL DUCTAL HYPERPLASIA  AREA OF LEFT BREAST;  Surgeon: Judeth Horn, MD;  Location: [place];  Service: General;  Laterality: Left;   BREAST RECONSTRUCTION Left 07/10/2015   Procedure: REVISION OF LEFT BREAST REDUCTION;  Surgeon: Loel Lofty Dillingham, DO;  Location: [place];  Service: Plastics;  Laterality: Left;   BREAST REDUCTION SURGERY Bilateral 06/17/2015   Procedure: BILATERAL BREAST  REDUCTION  WITH RECONSTRUCTION  ON LEFT  AFTER PARTIAL MASTECTOMY;  Surgeon: Wallace Going, DO;  Location: [place];  Service: Plastics;  Laterality: Bilateral;   CARPAL TUNNEL RELEASE Right x2  last one 2008   CHOLECYSTECTOMY  2007   CYSTO/  URETEROSCOPIC LASER LITHOTRIPSY / STONE EXTRACTION'S/  STENT PLACEMENT  x3  last one 2014   INCISION AND DRAINAGE OF WOUND Right 08/14/2015   Procedure: Excision of right breast WOUND;  Surgeon: Loel Lofty Dillingham, DO;  Location: [place];  Service: Plastics;  Laterality: Right;   INCISION AND DRAINAGE OF WOUND Right 09/22/2015   Procedure: IRRIGATION AND DEBRIDEMENT WOUND;  Surgeon: Wallace Going, DO;  Location: [place];  Service: Plastics;  Laterality: Right;   INCISION AND DRAINAGE OF WOUND Right 10/30/2015   Procedure: IRRIGATION AND DEBRIDEMENT RIGHT BREAST WOUND;  Surgeon: Loel Lofty Dillingham, DO;  Location: [place];  Service: Plastics;  Laterality: Right;   LUMBAR FUSION  2008   L1 -- L2   MASS EXCISION Left 09/22/2015   Procedure: EXCISION LEFT BREAST MASS;  Surgeon: Wallace Going, DO;  Location: [place];  Service: Plastics;   Laterality: Left;   NEGATIVE SLEEP STUDY  2007  (approx)  per pt   PARTIAL NEPHRECTOMY Left 1972   "part of kidney rotted"   RE-EXCISION OF BREAST LUMPECTOMY Left 07/10/2015   Procedure: RE-EXCISION OF LEFT BREAST LUMPECTOMY ;  Surgeon: Judeth Horn, MD;  Location: [place];  Service: General;  Laterality: Left;   REDUCTION MAMMAPLASTY     bilaterally   RETINAL DETACHMENT SURGERY Right 2015  [facility]   and Repair macular hole   SHOULDER ARTHROSCOPY Right 2013   THYROIDECTOMY Bilateral 2012     SOCIAL HISTORY:  Social History   Socioeconomic History   Marital status: Divorced    Spouse name: Not on file   Number of children: Not on file   Years of education: Not on file   Highest education level: Not on file  Occupational History   Not on file  Tobacco Use   Smoking status: Never   Smokeless tobacco: Never  Substance and Sexual Activity   Alcohol use: Yes    Comment: rare   Drug use: No   Sexual activity: Not on file  Other  Topics Concern   Not on file  Social History Narrative   Not on file   Social Determinants of Health   Financial Resource Strain: Not on file  Food Insecurity: Not on file  Transportation Needs: Not on file  Physical Activity: Not on file  Stress: Not on file  Social Connections: Not on file  Intimate Partner Violence: Not on file    FAMILY HISTORY:  Family History  Problem Relation Age of Onset   Renal cancer Mother    Breast cancer Sister     CURRENT MEDICATIONS:  Outpatient Encounter Medications as of 11/23/2021  Medication Sig   ALPHAGAN P 0.1 % SOLN PLACE 1 DROP INTO LEFT EYE 3 TIMES DAILY   anastrozole (ARIMIDEX) 1 MG tablet Take 1 tablet (1 mg total) by mouth daily.   Cholecalciferol 25 MCG (1000 UT) tablet Take by mouth.   ciprofloxacin (CILOXAN) 0.3 % ophthalmic solution Apply to eye.   diazepam (VALIUM) 10 MG tablet Take 10 mg by mouth daily.    dorzolamide-timolol (COSOPT) 22.3-6.8 MG/ML ophthalmic solution SMARTSIG:In Eye(s)    Fluoxetine HCl, PMDD, 20 MG TABS Take by mouth.   levothyroxine (SYNTHROID, LEVOTHROID) 112 MCG tablet Take by mouth.   meloxicam (MOBIC) 15 MG tablet Take 15 mg by mouth daily.   metoprolol succinate (TOPROL-XL) 50 MG 24 hr tablet Take 50 mg by mouth daily.   Misc. 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   Netarsudil Dimesylate 0.02 % SOLN Apply to eye.   ondansetron (ZOFRAN-ODT) 4 MG disintegrating tablet Take by mouth.   pantoprazole (PROTONIX) 40 MG tablet Take 40 mg by mouth every morning.   phenazopyridine (PYRIDIUM) 200 MG tablet Take 200 mg by mouth every 8 (eight) hours as needed.   tiZANidine (ZANAFLEX) 4 MG tablet Take 4 mg by mouth every 6 (six) hours as needed.   valACYclovir (VALTREX) 1000 MG tablet    [DISCONTINUED] aspirin 81 MG chewable tablet Chew 81 mg by mouth daily.   No facility-administered encounter medications on file as of 11/23/2021.    ALLERGIES:  Allergies  Allergen Reactions   Sulfamethoxazole-Trimethoprim Other (See Comments)    Lactic acid increase Lactic acid increase Lactic acid increase   Vancomycin Hives    Other reaction(s): Red Man Syndrome   Codeine Rash     PHYSICAL EXAM:  ECOG Performance status: 0  Vitals:   11/23/21 1402  BP: (!) 153/83  Pulse: 74  Resp: 18  Temp: 98.2 F (36.8 C)   Filed Weights   11/23/21 1402  Weight: 245 lb 12.8 oz (111.5 kg)    Physical Exam Constitutional:      General: She is not in acute distress.    Appearance: She is well-developed. She is not ill-appearing.  HENT:     Head: Atraumatic.     Nose: Nose normal.     Mouth/Throat:     Pharynx: No oropharyngeal exudate.  Eyes:     General: No scleral icterus.    Conjunctiva/sclera: Conjunctivae normal.  Cardiovascular:     Rate and Rhythm: Normal rate and regular rhythm.     Pulses: Normal pulses.  Pulmonary:     Effort: Pulmonary effort is normal.     Breath sounds: Normal breath sounds.  Abdominal:     General:  Bowel sounds are normal. There is no distension.     Palpations: Abdomen is soft.     Tenderness: There is no abdominal tenderness.  Musculoskeletal:  General: No tenderness or deformity. Normal range of motion.     Cervical back: Normal range of motion and neck supple.  Skin:    General: Skin is warm and dry.     Coloration: Skin is not pale.  Neurological:     General: No focal deficit present.     Mental Status: She is alert and oriented to person, place, and time.  Psychiatric:        Mood and Affect: Mood normal.        Behavior: Behavior normal.   Breast: No palpable masses in bilateral breasts.  No palpable adenopathy.   LABORATORY DATA:  I have reviewed the labs as listed.  CBC    Component Value Date/Time   WBC 6.8 11/16/2021 1408   RBC 4.92 11/16/2021 1408   HGB 12.8 11/16/2021 1408   HCT 41.1 11/16/2021 1408   PLT 269 11/16/2021 1408   MCV 83.5 11/16/2021 1408   MCH 26.0 11/16/2021 1408   MCHC 31.1 11/16/2021 1408   RDW 14.2 11/16/2021 1408   LYMPHSABS 2.5 11/16/2021 1408   MONOABS 0.4 11/16/2021 1408   EOSABS 0.2 11/16/2021 1408   BASOSABS 0.0 11/16/2021 1408      Latest Ref Rng & Units 11/16/2021    2:08 PM 08/13/2020    1:38 PM 01/23/2019   11:10 AM  CMP  Glucose 70 - 99 mg/dL 131  88  117   BUN 8 - 23 mg/dL _0 Creatinine 0.44 - 1.00 mg/dL 0.95  0.80  0.63   Sodium 135 - 145 mmol/L 138  139  139   Potassium 3.5 - 5.1 mmol/L 3.7  4.2  4.1   Chloride 98 - 111 mmol/L 100  101  100   CO2 22 - 32 mmol/L _1 Calcium 8.9 - 10.3 mg/dL 9.4  9.0  9.4   Total Protein 6.5 - 8.1 g/dL 7.3  6.8  7.0   Total Bilirubin 0.3 - 1.2 mg/dL 0.4  0.5  0.4   Alkaline Phos 38 - 126 U/L 51  52  53   AST 15 - 41 U/L _2 ALT 0 - 44 U/L _3 DIAGNOSTIC IMAGING:  I independently reviewed below imaging and agree with findings as stated:- 07/22/2020-mammogram diagnostic breast Tomo bilateral- No suspicious masses in either breast.   Stable lumpectomy changes.  No mammographic evidence of malignancy in either breast.  Recommend screening mammogram in 1 year.     ASSESSMENT:  1.  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 - Status post lumpectomy. - MammaPrint low risk, no benefit from chemotherapy. - Radiation completed in November 2017. - Anastrozole started in December 2017. - She discussed with previous providers about continuing anastrozole beyond 5 years without BCI testing.   PLAN:   1. 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  No adenopathy. - Mammogram on 11/16/2021: BI-RADS Category 1. - Labs on 11/16/2021 shows normal LFTs and CBC. - Continue anastrozole for total of 10 years. - RTC 1 year for follow-up with mammogram and labs.  2.  Osteopenia: - Vitamin D level is 27. - She reports that she does not absorb vitamin D as her PMD has tried all the vitamin D preparations without help. -  DEXA scan on 09/01/2020 with T score -1.3, osteopenia. - Continue calcium supplements.    Orders placed this encounter:  Orders Placed This Encounter  Procedures   MM 3D SCREEN BREAST BILATERAL   CBC with Differential/Platelet   Comprehensive metabolic panel   VITAMIN D 25 Hydroxy (Vit-D Deficiency, Fractures)     Derek Jack, MD [place] [phone]

## 2021-11-23 NOTE — Patient Instructions (Signed)
[place]  Discharge Instructions  You were seen and examined today by Dr. Delton Coombes.  Dr. Delton Coombes discussed your most recent lab work which revealed that everything looks good except for your Vitamin D levels are low.  Follow-up as scheduled in 1 year with labs.    Thank you for choosing [facility] to provide your oncology and hematology care.   To afford each patient quality time with our provider, please arrive at least 15 minutes before your scheduled appointment time. You may need to reschedule your appointment if you arrive late (10 or more minutes). Arriving late affects you and other patients whose appointments are after yours.  Also, if you miss three or more appointments without notifying the office, you may be dismissed from the clinic at the provider's discretion.    Again, thank you for choosing [facility].  Our hope is that these requests will decrease the amount of time that you wait before being seen by our physicians.   If you have a lab appointment with the [facility] please come in thru the Main Entrance and check in at the main information desk.           _____________________________________________________________  Should you have questions after your visit to [facility], please contact our office at [phone] and follow the prompts.  Our office hours are 8:00 a.m. to 4:30 p.m. Monday - Thursday and 8:00 a.m. to 2:30 p.m. Friday.  Please note that voicemails left after 4:00 p.m. may not be returned until the following business day.  We are closed weekends and all major holidays.  You do have access to a nurse 24-7, just call the main number to the clinic [phone] and do not press any options, hold on the line and a nurse will answer the phone.    For prescription refill requests, have your pharmacy contact our office and allow 72 hours.    Masks are optional in the  cancer centers. If you would like for your care team to wear a mask while they are taking care of you, please let them know. You may have one support person who is at least 71 years old accompany you for your appointments.

## 2022-03-16 ENCOUNTER — Other Ambulatory Visit: Payer: Self-pay | Admitting: *Deleted

## 2022-03-16 DIAGNOSIS — Z170 Estrogen receptor positive status [ER+]: Secondary | ICD-10-CM

## 2022-03-16 MED ORDER — ANASTROZOLE 1 MG PO TABS: 1.0000 mg | ORAL_TABLET | Freq: Every day | ORAL | 6 refills | Status: DC

## 2022-03-16 NOTE — Telephone Encounter (Signed)
Anastrozole refill approved.  Patient is tolerating and is to continue therapy.  

## 2022-10-01 ENCOUNTER — Ambulatory Visit: Payer: Medicare Other | Admitting: Oncology

## 2022-11-02 ENCOUNTER — Telehealth: Payer: Self-pay | Admitting: *Deleted

## 2022-11-02 ENCOUNTER — Other Ambulatory Visit (HOSPITAL_COMMUNITY): Payer: Self-pay | Admitting: Hematology

## 2022-11-02 ENCOUNTER — Other Ambulatory Visit: Payer: Self-pay | Admitting: *Deleted

## 2022-11-02 DIAGNOSIS — C50412 Malignant neoplasm of upper-outer quadrant of left female breast: Secondary | ICD-10-CM

## 2022-11-02 DIAGNOSIS — Z853 Personal history of malignant neoplasm of breast: Secondary | ICD-10-CM

## 2022-11-02 NOTE — Telephone Encounter (Signed)
Patient called to advise that she has found a new lump in left breast.  Will change screening mammogram to diagnostic.  Patient knows to call scheduling to have them change appointment.  Dr. Ellin Saba aware.

## 2022-11-19 ENCOUNTER — Ambulatory Visit (HOSPITAL_COMMUNITY): Payer: Medicare Other

## 2022-11-19 ENCOUNTER — Inpatient Hospital Stay: Payer: Medicare Other

## 2022-11-29 ENCOUNTER — Inpatient Hospital Stay: Payer: Medicare Other | Admitting: Hematology

## 2022-12-07 ENCOUNTER — Inpatient Hospital Stay: Payer: Medicare Other

## 2022-12-07 ENCOUNTER — Ambulatory Visit (HOSPITAL_COMMUNITY): Payer: Medicare Other

## 2022-12-07 ENCOUNTER — Encounter (HOSPITAL_COMMUNITY): Payer: Medicare Other

## 2022-12-09 ENCOUNTER — Inpatient Hospital Stay: Payer: Medicare Other | Admitting: Hematology

## 2022-12-27 ENCOUNTER — Other Ambulatory Visit: Payer: Self-pay

## 2022-12-27 DIAGNOSIS — Z170 Estrogen receptor positive status [ER+]: Secondary | ICD-10-CM

## 2022-12-27 DIAGNOSIS — E559 Vitamin D deficiency, unspecified: Secondary | ICD-10-CM

## 2022-12-28 ENCOUNTER — Ambulatory Visit (HOSPITAL_COMMUNITY)
Admission: RE | Admit: 2022-12-28 | Discharge: 2022-12-28 | Disposition: A | Discharge status: Home / Self Care | Payer: Medicare Other | Source: Ambulatory Visit | Attending: Hematology | Admitting: Hematology

## 2022-12-28 ENCOUNTER — Inpatient Hospital Stay: Payer: Medicare Other | Attending: Family Medicine

## 2022-12-28 ENCOUNTER — Other Ambulatory Visit (HOSPITAL_COMMUNITY): Payer: Self-pay | Admitting: Hematology

## 2022-12-28 DIAGNOSIS — Z853 Personal history of malignant neoplasm of breast: Secondary | ICD-10-CM

## 2022-12-28 DIAGNOSIS — Z79811 Long term (current) use of aromatase inhibitors: Secondary | ICD-10-CM | POA: Diagnosis not present

## 2022-12-28 DIAGNOSIS — C50312 Malignant neoplasm of lower-inner quadrant of left female breast: Secondary | ICD-10-CM | POA: Diagnosis present

## 2022-12-28 DIAGNOSIS — R928 Other abnormal and inconclusive findings on diagnostic imaging of breast: Secondary | ICD-10-CM

## 2022-12-28 DIAGNOSIS — C50412 Malignant neoplasm of upper-outer quadrant of left female breast: Secondary | ICD-10-CM | POA: Diagnosis present

## 2022-12-28 DIAGNOSIS — Z9012 Acquired absence of left breast and nipple: Secondary | ICD-10-CM | POA: Diagnosis not present

## 2022-12-28 DIAGNOSIS — Z170 Estrogen receptor positive status [ER+]: Secondary | ICD-10-CM | POA: Insufficient documentation

## 2022-12-28 DIAGNOSIS — M8580 Other specified disorders of bone density and structure, unspecified site: Secondary | ICD-10-CM | POA: Diagnosis not present

## 2022-12-28 DIAGNOSIS — Z923 Personal history of irradiation: Secondary | ICD-10-CM | POA: Diagnosis not present

## 2022-12-28 DIAGNOSIS — E559 Vitamin D deficiency, unspecified: Secondary | ICD-10-CM

## 2022-12-28 LAB — CBC WITH DIFFERENTIAL/PLATELET
Abs Immature Granulocytes: 0.05 10*3/uL (ref 0.00–0.07)
Basophils Absolute: 0 10*3/uL (ref 0.0–0.1)
Basophils Relative: 0 %
Eosinophils Absolute: 0.2 10*3/uL (ref 0.0–0.5)
Eosinophils Relative: 3 %
HCT: 41.1 % (ref 36.0–46.0)
Hemoglobin: 12.6 g/dL (ref 12.0–15.0)
Immature Granulocytes: 1 %
Lymphocytes Relative: 31 %
Lymphs Abs: 2.6 10*3/uL (ref 0.7–4.0)
MCH: 27.2 pg (ref 26.0–34.0)
MCHC: 30.7 g/dL (ref 30.0–36.0)
MCV: 88.6 fL (ref 80.0–100.0)
Monocytes Absolute: 0.6 10*3/uL (ref 0.1–1.0)
Monocytes Relative: 7 %
Neutro Abs: 4.9 10*3/uL (ref 1.7–7.7)
Neutrophils Relative %: 58 %
Platelets: 268 10*3/uL (ref 150–400)
RBC: 4.64 MIL/uL (ref 3.87–5.11)
RDW: 13.8 % (ref 11.5–15.5)
WBC: 8.3 10*3/uL (ref 4.0–10.5)
nRBC: 0 % (ref 0.0–0.2)

## 2022-12-28 LAB — COMPREHENSIVE METABOLIC PANEL
ALT: 13 U/L (ref 0–44)
AST: 16 U/L (ref 15–41)
Albumin: 4.2 g/dL (ref 3.5–5.0)
Alkaline Phosphatase: 56 U/L (ref 38–126)
Anion gap: 11 (ref 5–15)
BUN: 18 mg/dL (ref 8–23)
CO2: 28 mmol/L (ref 22–32)
Calcium: 9.4 mg/dL (ref 8.9–10.3)
Chloride: 99 mmol/L (ref 98–111)
Creatinine, Ser: 0.8 mg/dL (ref 0.44–1.00)
GFR, Estimated: >60 mL/min (ref >60)
Glucose, Bld: 100 mg/dL — ABNORMAL HIGH (ref 70–99)
Potassium: 4 mmol/L (ref 3.5–5.1)
Sodium: 138 mmol/L (ref 135–145)
Total Bilirubin: 0.4 mg/dL (ref <1.2)
Total Protein: 7.2 g/dL (ref 6.5–8.1)

## 2022-12-28 LAB — VITAMIN D 25 HYDROXY (VIT D DEFICIENCY, FRACTURES): Vit D, 25-Hydroxy: 30.52 ng/mL (ref 30–100)

## 2023-01-04 ENCOUNTER — Ambulatory Visit (HOSPITAL_COMMUNITY)
Admission: RE | Admit: 2023-01-04 | Discharge: 2023-01-04 | Disposition: A | Discharge status: Home / Self Care | Payer: Medicare Other | Source: Ambulatory Visit | Attending: Hematology | Admitting: Hematology

## 2023-01-04 ENCOUNTER — Encounter (HOSPITAL_COMMUNITY): Payer: Self-pay

## 2023-01-04 DIAGNOSIS — R928 Other abnormal and inconclusive findings on diagnostic imaging of breast: Secondary | ICD-10-CM

## 2023-01-04 DIAGNOSIS — Z853 Personal history of malignant neoplasm of breast: Secondary | ICD-10-CM

## 2023-01-04 HISTORY — PX: BREAST BIOPSY: SHX20

## 2023-01-04 MED ORDER — LIDOCAINE-EPINEPHRINE (PF) 1 %-1:200000 IJ SOLN
10.0000 mL | Freq: Once | INTRAMUSCULAR | Status: AC
  Administered 2023-01-04: 10 mL via INTRADERMAL

## 2023-01-04 MED ORDER — LIDOCAINE HCL (PF) 2 % IJ SOLN
10.0000 mL | Freq: Once | INTRAMUSCULAR | Status: AC
  Administered 2023-01-04: 10 mL

## 2023-01-04 MED ORDER — LIDOCAINE-EPINEPHRINE (PF) 1 %-1:200000 IJ SOLN
INTRAMUSCULAR | Status: AC
  Filled 2023-01-04: qty 30

## 2023-01-04 MED ORDER — LIDOCAINE HCL (PF) 2 % IJ SOLN
INTRAMUSCULAR | Status: AC
  Filled 2023-01-04: qty 10

## 2023-01-04 NOTE — Progress Notes (Signed)
PT tolerated left breast biopsy well today with NAD noted. PT verbalized understanding of discharge instructions. PT ambulated back to the mammogram area this time and given ice packs. Specimens taken to lab by Lexi from ultrasound at this time.

## 2023-01-06 LAB — SURGICAL PATHOLOGY

## 2023-01-10 NOTE — Progress Notes (Signed)
[facility] [address], [place] [address]    Clinic Day:  01/11/2023  Referring physician: Maximiano Coss,*  Patient Care Team: Maximiano Coss, MD as PCP - General (Family Medicine)   ASSESSMENT & PLAN:   Assessment: 1.  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 - Status post lumpectomy. - MammaPrint low risk, no benefit from chemotherapy. - Radiation completed in November 2017. - Anastrozole started in December 2017. - She discussed with previous providers about continuing anastrozole beyond 5 years without BCI testing.  Plan: 1. Stage IIa invasive lobular carcinoma of the left breast: - She is tolerating anastrozole reasonably well. - Physical exam today: Left breast lower inner quadrant nodules are stable.  No palpable adenopathy.  Right breast postsurgical changes are also stable. - Labs from 12/28/2022: Normal LFTs and CBC. - Reviewed mammogram from 12/28/2022. - Reviewed pathology report from 01/04/2023 of the left breast 6:00 mass biopsy which was benign. - Continue anastrozole for a total of 10 years.  RTC 1 year for follow-up.  Will arrange for bilateral mammogram prior to next visit.   2.  Osteopenia (DEXA 09/01/2020 T-score -1.3): - She is not taking any calcium or vitamin D supplements.  Her vitamin D level is 30.  Orders Placed This Encounter  Procedures   CBC with Differential    Standing Status:   Future    Expected Date:   12/28/2023    Expiration Date:   01/11/2024   Comprehensive metabolic panel    Standing Status:   Future    Expected Date:   12/28/2023    Expiration Date:   01/11/2024   Vitamin D 25 hydroxy    Standing Status:   Future    Expected Date:   12/28/2023    Expiration Date:   01/11/2024      Mikeal Hawthorne R Teague,acting as a scribe for Doreatha Massed, MD.,have documented all relevant documentation on the behalf of Doreatha Massed, MD,as directed by  Doreatha Massed, MD while in the presence of Doreatha Massed, MD.   I, Doreatha Massed MD, have reviewed the above documentation for accuracy and completeness, and I agree with the above.   Doreatha Massed, MD   12/17/20245:26 PM  CHIEF COMPLAINT:   Diagnosis: Malignant neoplasm of upper-outer quadrant of left breast in female, estrogen receptor positive and Vitamin D deficiency   Cancer Staging  Breast cancer, left breast (HCC) Staging form: Breast, AJCC 7th Edition - Pathologic stage from 06/17/2015: Stage IIA (T1c(2), N1a, cM0) - Signed by Ellouise Newer, PA-C on 08/10/2015   Current Therapy:  Arimidex   HISTORY OF PRESENT ILLNESS:   Oncology History  Breast cancer, left breast (HCC)  05/05/2015 Procedure   Left needle core biopsy   05/05/2015 Pathology Results   Breast, left, needle core biopsy, 2:00 o'clock - INVASIVE AND IN SITU MAMMARY CARCINOMA WITH CALCIFICATIONS.   06/03/2015 Procedure   Left breast biopsy   06/03/2015 Pathology Results   Breast, left, needle core biopsy, central - ATYPICAL DUCTAL HYPERPLASIA.   06/17/2015 Pathology Results   Breast, partial mastectomy, Left, upper outer quadrant with seed and needle loc - MULTIFOCAL INVASIVE GRADE II LOBULAR CARCINOMA, TWO FOCI MEASURING 1.8 CM AND 1.7 CM IN GREATEST DIMENSION.   06/17/2015 Procedure   Left partial mastectomy by Dr. Jimmye Norman   06/17/2015 Initial Diagnosis   Breast cancer (HCC)   07/10/2015 Procedure   Left  breast re-excision by Dr. Ulice Bold   07/10/2015 Pathology Results   Breast, excision, Left Re-excision - BENIGN BREAST TISSUE WITH BIOPSY CHANGES. - NO RESIDUAL TUMOR. - FINAL MARGINS CLEAR.Breast, left, needle core biopsy, outer - INVASIVE LOBULAR CARCINOMA.   08/01/2015 - 08/04/2015 Hospital Admission   Status post bilateral breast reduction   Status post partial mastectomy of left breast   Open wound of right breast   Open breast wound    08/11/2015 Miscellaneous   Mammaprint LOW RISK: No Potential Significant Chemotherapy Benefit   11/11/2015 - 12/15/2015 Radiation Therapy   Adjuvant radiation therapy; [place]; Left breast and regional LN to 50.4 GY/1.8 Gy       01/05/2016 Imaging   Bone density- BMD as determined from [facility] L1-L2 is 1.063 g/cm2 with a T-Score of -0.9. This patient is considered normal according to [facility] Health [facility]) criteria.   12/2015 -  Anti-estrogen oral therapy   Anastrozole daily       INTERVAL HISTORY:   Colleen Cohen is a 72 y.o. female presenting to clinic today for follow up of left breast cancer. She was last seen by me on 11/23/21.  Since her last visit, she underwent a [place] and diagnostic MM of the left breast on 12/28/22 that found: an indeterminate 0.6 cm mass in the left breast at the 6 o'clock position. She had a left breast biopsy of the 6 o'clock mass on 01/04/23 with pathology revealing: benign breast tissue with fat necrosis, chronic inflammation, and hemosiderin deposit; and negative for atypia or malignancy.   Today, she states that she is doing well overall. Her appetite level is at 50%. Her energy level is at 20%. She is accompanied by a family member. She denies any side effects from anastrozole. She is not taking Calcium or vitamin D, as she was told by her PCP that she does not absorb these supplements well. She does eat a fair amount of dairy products. She has a history of 6 reconstruction surgeries to her right breast that led to an infection from the right breast to the chest wall.  PAST MEDICAL HISTORY:   Past Medical History: Past Medical History:  Diagnosis Date   Anxiety    Arthritis    Breast cancer [facility]) oncologist-  shannon penland ([facility] and Dr Roselind Messier    dx 04/ 2017-- left breast lobular multifocal carcinoma x2 sites-- Stage 2 w/ mets x1 positive node--  ER/PR+,  HER2 negative --  s/p  left breast lumpectomy with radioactive seed implants and  re-excision for positive margin 06-21-2015   Depression    GERD (gastroesophageal reflux disease)    History of kidney stones    Hypertension    Hypothyroidism, postsurgical    Migraine    Myopia of both eyes    Nephrolithiasis    PONV (postoperative nausea and vomiting)    Wears dentures    Wears glasses    Wound of right breast     Surgical History: Past Surgical History:  Procedure Laterality Date   APPLICATION OF A-CELL OF CHEST/ABDOMEN Right 10/30/2015   Procedure: APPLICATION OF A-CELL RIGHT BREAST WOUND;  Surgeon: Alena Bills Dillingham, DO;  Location: [facility];  Service: Plastics;  Laterality: Right;   APPLICATION OF WOUND VAC Right 08/14/2015   Procedure: APPLICATION OF WOUND VAC;  Surgeon: Alena Bills Dillingham, DO;  Location: [facility];  Service: Plastics;  Laterality: Right;   BREAST BIOPSY Left 01/04/2023   [place] LT BREAST BX W LOC  DEV 1ST LESION IMG BX SPEC US GUIDE 01/04/2023 [facility]-ULTRASOUND   BREAST LUMPECTOMY WITH RADIOACTIVE SEED AND SENTINEL LYMPH NODE BIOPSY Left 06/17/2015   Procedure: LEFT BREAST LUMPECTOMY WITH RADIOACTIVE SEED AND SENTINEL LYMPH NODE BIOPSY, NEEDLE LOCALIZATION IN ATYPICAL DUCTAL HYPERPLASIA  AREA OF LEFT BREAST;  Surgeon: Jimmye Norman, MD;  Location: [facility];  Service: General;  Laterality: Left;   BREAST RECONSTRUCTION Left 07/10/2015   Procedure: REVISION OF LEFT BREAST REDUCTION;  Surgeon: Alena Bills Dillingham, DO;  Location: [facility];  Service: Plastics;  Laterality: Left;   BREAST REDUCTION SURGERY Bilateral 06/17/2015   Procedure: BILATERAL BREAST  REDUCTION  WITH RECONSTRUCTION  ON LEFT  AFTER PARTIAL MASTECTOMY;  Surgeon: Peggye Form, DO;  Location: [facility];  Service: Plastics;  Laterality: Bilateral;   CARPAL TUNNEL RELEASE Right x2  last one 2008   CHOLECYSTECTOMY  2007   CYSTO/  URETEROSCOPIC LASER LITHOTRIPSY / STONE EXTRACTION'S/  STENT PLACEMENT  x3  last one 2014   INCISION AND DRAINAGE OF  WOUND Right 08/14/2015   Procedure: Excision of right breast WOUND;  Surgeon: Alena Bills Dillingham, DO;  Location: [facility];  Service: Plastics;  Laterality: Right;   INCISION AND DRAINAGE OF WOUND Right 09/22/2015   Procedure: IRRIGATION AND DEBRIDEMENT WOUND;  Surgeon: Peggye Form, DO;  Location: [facility];  Service: Plastics;  Laterality: Right;   INCISION AND DRAINAGE OF WOUND Right 10/30/2015   Procedure: IRRIGATION AND DEBRIDEMENT RIGHT BREAST WOUND;  Surgeon: Alena Bills Dillingham, DO;  Location: [facility];  Service: Plastics;  Laterality: Right;   LUMBAR FUSION  2008   L1 -- L2   MASS EXCISION Left 09/22/2015   Procedure: EXCISION LEFT BREAST MASS;  Surgeon: Peggye Form, DO;  Location: [facility];  Service: Plastics;  Laterality: Left;   NEGATIVE SLEEP STUDY  2007  (approx)  per pt   PARTIAL NEPHRECTOMY Left 1972   "part of kidney rotted"   RE-EXCISION OF BREAST LUMPECTOMY Left 07/10/2015   Procedure: RE-EXCISION OF LEFT BREAST LUMPECTOMY ;  Surgeon: Jimmye Norman, MD;  Location: [facility];  Service: General;  Laterality: Left;   REDUCTION MAMMAPLASTY     bilaterally   RETINAL DETACHMENT SURGERY Right 2015  [facility]   and Repair macular hole   SHOULDER ARTHROSCOPY Right 2013   THYROIDECTOMY Bilateral 2012    Social History: Social History   Socioeconomic History   Marital status: Divorced    Spouse name: Not on file   Number of children: Not on file   Years of education: Not on file   Highest education level: Not on file  Occupational History   Not on file  Tobacco Use   Smoking status: Never   Smokeless tobacco: Never  Vaping Use   Vaping status: Never Used  Substance and Sexual Activity   Alcohol use: Yes    Comment: rare   Drug use: No   Sexual activity: Not on file  Other Topics Concern   Not on file  Social History Narrative   Not on file   Social Drivers of Health   Financial Resource Strain: Not on file   Food Insecurity: Not on file  Transportation Needs: Not on file  Physical Activity: Not on file  Stress: Not on file  Social Connections: Not on file  Intimate Partner Violence: Not on file    Family History: Family History  Problem Relation Age of Onset   Renal  cancer Mother    Breast cancer Sister     Current Medications:  Current Outpatient Medications:    anastrozole (ARIMIDEX) 1 MG tablet, Take 1 tablet (1 mg total) by mouth daily., Disp: 90 tablet, Rfl: 6   Cholecalciferol 25 MCG (1000 UT) tablet, Take by mouth., Disp: , Rfl:    diazepam (VALIUM) 10 MG tablet, Take 10 mg by mouth daily. , Disp: , Rfl: 1   dorzolamide-timolol (COSOPT) 22.3-6.8 MG/ML ophthalmic solution, SMARTSIG:In Eye(s), Disp: , Rfl:    Fluoxetine HCl, PMDD, 20 MG TABS, Take by mouth., Disp: , Rfl:    levothyroxine (SYNTHROID, LEVOTHROID) 112 MCG tablet, Take by mouth., Disp: , Rfl:    metoprolol succinate (TOPROL-XL) 50 MG 24 hr tablet, Take 50 mg by mouth daily., Disp: , Rfl:    Misc. Devices MISC, Please provide the patient with 2 mastectomy bras., Disp: 1 each, Rfl: 0   pantoprazole (PROTONIX) 40 MG tablet, Take 40 mg by mouth every morning., Disp: , Rfl:    phenazopyridine (PYRIDIUM) 200 MG tablet, Take 200 mg by mouth every 8 (eight) hours as needed., Disp: , Rfl: 1   tiZANidine (ZANAFLEX) 4 MG tablet, Take 4 mg by mouth every 6 (six) hours as needed., Disp: , Rfl:    valACYclovir (VALTREX) 1000 MG tablet, , Disp: , Rfl:    ondansetron (ZOFRAN-ODT) 4 MG disintegrating tablet, Take by mouth. (Patient not taking: Reported on 01/11/2023), Disp: , Rfl:    Allergies: Allergies  Allergen Reactions   Sulfamethoxazole-Trimethoprim Other (See Comments)    Lactic acid increase Lactic acid increase Lactic acid increase   Vancomycin Hives    Other reaction(s): Red Man Syndrome   Codeine Rash    REVIEW OF SYSTEMS:   Review of Systems  Constitutional:  Negative for chills, fatigue and fever.  HENT:    Negative for lump/mass, mouth sores, nosebleeds, sore throat and trouble swallowing.   Eyes:  Negative for eye problems.  Respiratory:  Positive for shortness of breath (with exertion). Negative for cough.   Cardiovascular:  Negative for chest pain, leg swelling and palpitations.  Gastrointestinal:  Positive for constipation and diarrhea. Negative for abdominal pain, nausea and vomiting.  Genitourinary:  Negative for bladder incontinence, difficulty urinating, dysuria, frequency, hematuria and nocturia.   Musculoskeletal:  Negative for arthralgias, back pain, flank pain, myalgias and neck pain.       +right arm pain, encompassing shoulder to elbow with a 10/10 severity  Skin:  Negative for itching and rash.  Neurological:  Positive for dizziness and headaches. Negative for numbness.  Hematological:  Does not bruise/bleed easily.  Psychiatric/Behavioral:  Negative for depression, sleep disturbance and suicidal ideas. The patient is not nervous/anxious.   All other systems reviewed and are negative.    VITALS:   Blood pressure (!) 174/82, pulse 77, temperature (!) 97.5 F (36.4 C), temperature source Tympanic, resp. rate 18, height 5' 7.5" (1.715 m), weight 246 lb 12.8 oz (111.9 kg), SpO2 100%.  Wt Readings from Last 3 Encounters:  01/11/23 246 lb 12.8 oz (111.9 kg)  11/23/21 245 lb 12.8 oz (111.5 kg)  08/21/20 261 lb 11 oz (118.7 kg)    Body mass index is 38.08 kg/m.  Performance status (ECOG): 1 - Symptomatic but completely ambulatory  PHYSICAL EXAM:   Physical Exam Vitals and nursing note reviewed. Exam conducted with a chaperone present.  Constitutional:      Appearance: Normal appearance.  Cardiovascular:     Rate and Rhythm: Normal rate and  regular rhythm.     Pulses: Normal pulses.     Heart sounds: Normal heart sounds.  Pulmonary:     Effort: Pulmonary effort is normal.     Breath sounds: Normal breath sounds.  Chest:  Breasts:    Left: Tenderness present.      Comments: +scar tissue in left breast at the 7 o'clock position Abdominal:     Palpations: Abdomen is soft. There is no hepatomegaly, splenomegaly or mass.     Tenderness: There is no abdominal tenderness.  Musculoskeletal:     Right lower leg: No edema.     Left lower leg: No edema.  Lymphadenopathy:     Cervical: No cervical adenopathy.     Right cervical: No superficial, deep or posterior cervical adenopathy.    Left cervical: No superficial, deep or posterior cervical adenopathy.     Upper Body:     Right upper body: No supraclavicular or axillary adenopathy.     Left upper body: No supraclavicular or axillary adenopathy.  Neurological:     General: No focal deficit present.     Mental Status: She is alert and oriented to person, place, and time.  Psychiatric:        Mood and Affect: Mood normal.        Behavior: Behavior normal.   Breast Exam Chaperone: Kennith Gain, RN   LABS:      Latest Ref Rng & Units 12/28/2022   11:40 AM 11/16/2021    2:08 PM 08/13/2020    1:38 PM  CBC  WBC 4.0 - 10.5 K/uL 8.3  6.8  6.2   Hemoglobin 12.0 - 15.0 g/dL 13.0  86.5  78.4   Hematocrit 36.0 - 46.0 % 41.1  41.1  39.3   Platelets 150 - 400 K/uL 268  269  261       Latest Ref Rng & Units 12/28/2022   11:40 AM 11/16/2021    2:08 PM 08/13/2020    1:38 PM  CMP  Glucose 70 - 99 mg/dL 696  295  88   BUN 8 - 23 mg/dL 18  13  14    Creatinine 0.44 - 1.00 mg/dL 2.84  1.32  4.40   Sodium 135 - 145 mmol/L 138  138  139   Potassium 3.5 - 5.1 mmol/L 4.0  3.7  4.2   Chloride 98 - 111 mmol/L 99  100  101   CO2 22 - 32 mmol/L 28  27  28    Calcium 8.9 - 10.3 mg/dL 9.4  9.4  9.0   Total Protein 6.5 - 8.1 g/dL 7.2  7.3  6.8   Total Bilirubin <1.2 mg/dL 0.4  0.4  0.5   Alkaline Phos 38 - 126 U/L 56  51  52   AST 15 - 41 U/L 16  26  22    ALT 0 - 44 U/L 13  14  19       No results found for: "CEA1", "CEA" / No results found for: "CEA1", "CEA" No results found for: "PSA1" No results found for:  "NUU725" No results found for: "CAN125"  No results found for: "TOTALPROTELP", "ALBUMINELP", "A1GS", "A2GS", "BETS", "BETA2SER", "GAMS", "MSPIKE", "SPEI" No results found for: "TIBC", "FERRITIN", "IRONPCTSAT" Lab Results  Component Value Date   LDH 129 06/13/2018     STUDIES:   [place] LT BREAST BX W LOC DEV 1ST LESION IMG BX SPEC US GUIDE Addendum Date: 01/11/2023 ADDENDUM REPORT: 01/11/2023 07:22 ADDENDUM: PATHOLOGY revealed: A. BREAST, LEFT, 6  OC MASS, NEEDLE CORE BIOPSY: Benign breast tissue with fat necrosis, chronic inflammation, and hemosiderin deposit. Negative for atypia or malignancy. COMMENT: The patients history of breast carcinoma was noted. Immunohistochemical stain for CK AE1/AE3 was performed and does not show evidence of invasive carcinoma. There is no evidence of malignancy. Pathology results are CONCORDANT with imaging findings, per Dr. Edwin Cap. Pathology results and recommendations were discussed with patient via telephone on 01/05/2023 by Randa Lynn RN. Patient reported biopsy site doing well with no adverse symptoms, and only slight tenderness at the site. Post biopsy care instructions were reviewed, questions were answered and my direct phone number was provided. Patient was instructed to call [facility] Department for any additional questions or concerns related to biopsy site. RECOMMENDATION: Patient instructed to resume annual bilateral screening mammogram due December 2025. Pathology results reported by Randa Lynn RN on 01/07/2023. Electronically Signed   By: Edwin Cap M.D.   On: 01/11/2023 07:22   Result Date: 01/11/2023 CLINICAL DATA:  Patient presents for ultrasound-guided core biopsy of an indeterminate 0.6 cm mass in the left breast at the 6 o'clock position. EXAM: ULTRASOUND GUIDED LEFT BREAST CORE NEEDLE BIOPSY COMPARISON:  Previous exam(s). PROCEDURE: I met with the patient and we discussed the procedure of  ultrasound-guided biopsy, including benefits and alternatives. We discussed the high likelihood of a successful procedure. We discussed the risks of the procedure, including infection, bleeding, tissue injury, clip migration, and inadequate sampling. Informed written consent was given. The usual time-out protocol was performed immediately prior to the procedure. Lesion quadrant: Lower outer Using sterile technique and 1% Lidocaine as local anesthetic, under direct ultrasound visualization, a 14 gauge spring-loaded device was used to perform biopsy of the mass in the left breast at the 6 o'clock position using a medial to lateral approach. At the conclusion of the procedure a ribbon shaped tissue marker clip was deployed into the biopsy cavity. Follow up 2 view mammogram was performed and dictated separately. IMPRESSION: Ultrasound guided biopsy of the mass in the left breast at the 6 o'clock position. No apparent complications. Electronically Signed: By: Edwin Cap M.D. On: 01/04/2023 13:31   MM CLIP PLACEMENT LEFT Result Date: 01/04/2023 CLINICAL DATA:  Post ultrasound-guided core biopsy of an indeterminate mass in the left breast at the 6 o'clock position. EXAM: 3D DIAGNOSTIC LEFT MAMMOGRAM POST ULTRASOUND BIOPSY COMPARISON:  Previous exam(s). FINDINGS: 3D Mammographic images were obtained following ultrasound-guided core biopsy of a 0.6 cm mass in the left breast at the 6 o'clock position. A ribbon shaped biopsy marking clip is present at the site of the biopsied mass in the left breast at the 6 o'clock position. IMPRESSION: Ribbon shaped biopsy marking clip at site of biopsied mass in the left breast at the 6 o'clock position. Final Assessment: Post Procedure Mammograms for Marker Placement Electronically Signed   By: Edwin Cap M.D.   On: 01/04/2023 13:40   [place] LIMITED ULTRASOUND INCLUDING AXILLA LEFT BREAST  Result Date: 12/28/2022 CLINICAL DATA:  72 year old female with history of left  breast multifocal carcinoma post lumpectomy 2017 presents with a palpable area of concern in the left breast. EXAM: DIGITAL DIAGNOSTIC BILATERAL MAMMOGRAM WITH TOMOSYNTHESIS AND CAD; ULTRASOUND LEFT BREAST LIMITED TECHNIQUE: Bilateral digital diagnostic mammography and breast tomosynthesis was performed. The images were evaluated with computer-aided detection. ; Targeted ultrasound examination of the left breast was performed. COMPARISON:  Previous exam(s). ACR Breast Density Category b: There are scattered areas of fibroglandular density. FINDINGS:  No suspicious masses or calcifications are seen in the right breast. Spot compression tomograms at the site of palpable concern in the left breast reveals a fat containing 0.5 cm mass with a thin rim with findings most compatible with an oil cyst. There is a 0.5 cm irregular somewhat spiculated mass in the lower central left breast. Targeted ultrasound of the left breast in the region of palpable concern was performed demonstrating multiple oil cysts, with an oil cyst at 7 o'clock 6 cm from nipple measuring 0.6 cm. In addition, targeted ultrasound of the lower left breast was performed demonstrating an irregular hypoechoic mass at 6 o'clock 3 cm from nipple measuring 0.6 x 0.5 x 0.4 cm. This is felt to correspond well with the mass seen in the lower central left breast at mammography. No lymphadenopathy seen in the left axilla. IMPRESSION: Indeterminate 0.6 cm mass in the left breast at the 6 o'clock position. RECOMMENDATION: Recommend ultrasound-guided core biopsy of the mass in the left breast at the 6 o'clock position. I have discussed the findings and recommendations with the patient. If applicable, a reminder letter will be sent to the patient regarding the next appointment. BI-RADS CATEGORY  4: Suspicious. Electronically Signed   By: Edwin Cap M.D.   On: 12/28/2022 11:19   MM DIAG BREAST TOMO BILATERAL Result Date: 12/28/2022 CLINICAL DATA:  72 year old  female with history of left breast multifocal carcinoma post lumpectomy 2017 presents with a palpable area of concern in the left breast. EXAM: DIGITAL DIAGNOSTIC BILATERAL MAMMOGRAM WITH TOMOSYNTHESIS AND CAD; ULTRASOUND LEFT BREAST LIMITED TECHNIQUE: Bilateral digital diagnostic mammography and breast tomosynthesis was performed. The images were evaluated with computer-aided detection. ; Targeted ultrasound examination of the left breast was performed. COMPARISON:  Previous exam(s). ACR Breast Density Category b: There are scattered areas of fibroglandular density. FINDINGS: No suspicious masses or calcifications are seen in the right breast. Spot compression tomograms at the site of palpable concern in the left breast reveals a fat containing 0.5 cm mass with a thin rim with findings most compatible with an oil cyst. There is a 0.5 cm irregular somewhat spiculated mass in the lower central left breast. Targeted ultrasound of the left breast in the region of palpable concern was performed demonstrating multiple oil cysts, with an oil cyst at 7 o'clock 6 cm from nipple measuring 0.6 cm. In addition, targeted ultrasound of the lower left breast was performed demonstrating an irregular hypoechoic mass at 6 o'clock 3 cm from nipple measuring 0.6 x 0.5 x 0.4 cm. This is felt to correspond well with the mass seen in the lower central left breast at mammography. No lymphadenopathy seen in the left axilla. IMPRESSION: Indeterminate 0.6 cm mass in the left breast at the 6 o'clock position. RECOMMENDATION: Recommend ultrasound-guided core biopsy of the mass in the left breast at the 6 o'clock position. I have discussed the findings and recommendations with the patient. If applicable, a reminder letter will be sent to the patient regarding the next appointment. BI-RADS CATEGORY  4: Suspicious. Electronically Signed   By: Edwin Cap M.D.   On: 12/28/2022 11:19

## 2023-01-11 ENCOUNTER — Other Ambulatory Visit (HOSPITAL_COMMUNITY): Payer: Self-pay | Admitting: Hematology

## 2023-01-11 ENCOUNTER — Inpatient Hospital Stay (HOSPITAL_BASED_OUTPATIENT_CLINIC_OR_DEPARTMENT_OTHER): Payer: Medicare Other | Admitting: Hematology

## 2023-01-11 VITALS — BP 174/82 | HR 77 | Temp 97.5°F | Resp 18 | Ht 67.5 in | Wt 246.8 lb

## 2023-01-11 DIAGNOSIS — C50412 Malignant neoplasm of upper-outer quadrant of left female breast: Secondary | ICD-10-CM

## 2023-01-11 DIAGNOSIS — Z170 Estrogen receptor positive status [ER+]: Secondary | ICD-10-CM

## 2023-01-11 DIAGNOSIS — E559 Vitamin D deficiency, unspecified: Secondary | ICD-10-CM | POA: Diagnosis not present

## 2023-01-11 DIAGNOSIS — Z1231 Encounter for screening mammogram for malignant neoplasm of breast: Secondary | ICD-10-CM

## 2023-01-11 NOTE — Patient Instructions (Signed)
[facility]  Discharge Instructions  You were seen and examined today by Dr. Ellin Saba.  Dr. Ellin Saba discussed your most recent lab work and mammogram which did not show any concerns for cancer.  Follow-up as scheduled.  Thank you for choosing [facility] to provide your oncology and hematology care.   To afford each patient quality time with our provider, please arrive at least 15 minutes before your scheduled appointment time. You may need to reschedule your appointment if you arrive late (10 or more minutes). Arriving late affects you and other patients whose appointments are after yours.  Also, if you miss three or more appointments without notifying the office, you may be dismissed from the clinic at the provider's discretion.    Again, thank you for choosing [facility].  Our hope is that these requests will decrease the amount of time that you wait before being seen by our physicians.   If you have a lab appointment with the Cancer Center - please note that after April 8th, all labs will be drawn in the cancer center.  You do not have to check in or register with the main entrance as you have in the past but will complete your check-in at the cancer center.            _____________________________________________________________  Should you have questions after your visit to [facility], please contact our office at [phone] and follow the prompts.  Our office hours are 8:00 a.m. to 4:30 p.m. Monday - Thursday and 8:00 a.m. to 2:30 p.m. Friday.  Please note that voicemails left after 4:00 p.m. may not be returned until the following business day.  We are closed weekends and all major holidays.  You do have access to a nurse 24-7, just call the main number to the clinic [phone] and do not press any options, hold on the line and a nurse will answer the phone.    For prescription refill requests,  have your pharmacy contact our office and allow 72 hours.    Masks are no longer required in the cancer centers. If you would like for your care team to wear a mask while they are taking care of you, please let them know. You may have one support person who is at least 72 years old accompany you for your appointments.

## 2023-05-16 ENCOUNTER — Other Ambulatory Visit: Payer: Self-pay | Admitting: Hematology

## 2023-05-16 DIAGNOSIS — C50412 Malignant neoplasm of upper-outer quadrant of left female breast: Secondary | ICD-10-CM

## 2023-05-29 ENCOUNTER — Other Ambulatory Visit: Payer: Self-pay

## 2023-05-29 ENCOUNTER — Emergency Department (HOSPITAL_COMMUNITY)
Admission: EM | Admit: 2023-05-29 | Discharge: 2023-05-29 | Disposition: A | Discharge status: Home / Self Care | Attending: Emergency Medicine | Admitting: Emergency Medicine

## 2023-05-29 ENCOUNTER — Encounter (HOSPITAL_COMMUNITY): Payer: Self-pay | Admitting: Emergency Medicine

## 2023-05-29 ENCOUNTER — Emergency Department (HOSPITAL_COMMUNITY)

## 2023-05-29 DIAGNOSIS — R2689 Other abnormalities of gait and mobility: Secondary | ICD-10-CM | POA: Diagnosis not present

## 2023-05-29 DIAGNOSIS — S0083XA Contusion of other part of head, initial encounter: Secondary | ICD-10-CM | POA: Diagnosis not present

## 2023-05-29 DIAGNOSIS — W06XXXA Fall from bed, initial encounter: Secondary | ICD-10-CM | POA: Diagnosis not present

## 2023-05-29 DIAGNOSIS — R519 Headache, unspecified: Secondary | ICD-10-CM | POA: Diagnosis present

## 2023-05-29 DIAGNOSIS — W19XXXA Unspecified fall, initial encounter: Secondary | ICD-10-CM

## 2023-05-29 LAB — BASIC METABOLIC PANEL WITH GFR
Anion gap: 9 (ref 5–15)
BUN: 18 mg/dL (ref 8–23)
CO2: 29 mmol/L (ref 22–32)
Calcium: 9.5 mg/dL (ref 8.9–10.3)
Chloride: 100 mmol/L (ref 98–111)
Creatinine, Ser: 0.93 mg/dL (ref 0.44–1.00)
GFR, Estimated: >60 mL/min (ref >60)
Glucose, Bld: 92 mg/dL (ref 70–99)
Potassium: 4.4 mmol/L (ref 3.5–5.1)
Sodium: 138 mmol/L (ref 135–145)

## 2023-05-29 LAB — CBC WITH DIFFERENTIAL/PLATELET
Abs Immature Granulocytes: 0.03 10*3/uL (ref 0.00–0.07)
Basophils Absolute: 0 10*3/uL (ref 0.0–0.1)
Basophils Relative: 0 %
Eosinophils Absolute: 0.2 10*3/uL (ref 0.0–0.5)
Eosinophils Relative: 3 %
HCT: 40.3 % (ref 36.0–46.0)
Hemoglobin: 12.3 g/dL (ref 12.0–15.0)
Immature Granulocytes: 0 %
Lymphocytes Relative: 41 %
Lymphs Abs: 2.8 10*3/uL (ref 0.7–4.0)
MCH: 26.2 pg (ref 26.0–34.0)
MCHC: 30.5 g/dL (ref 30.0–36.0)
MCV: 85.7 fL (ref 80.0–100.0)
Monocytes Absolute: 0.4 10*3/uL (ref 0.1–1.0)
Monocytes Relative: 5 %
Neutro Abs: 3.4 10*3/uL (ref 1.7–7.7)
Neutrophils Relative %: 51 %
Platelets: 272 10*3/uL (ref 150–400)
RBC: 4.7 MIL/uL (ref 3.87–5.11)
RDW: 14.1 % (ref 11.5–15.5)
WBC: 6.9 10*3/uL (ref 4.0–10.5)
nRBC: 0 % (ref 0.0–0.2)

## 2023-05-29 MED ORDER — KETOROLAC TROMETHAMINE 15 MG/ML IJ SOLN
15.0000 mg | Freq: Once | INTRAMUSCULAR | Status: AC
  Administered 2023-05-29: 15 mg via INTRAMUSCULAR
  Filled 2023-05-29: qty 1

## 2023-05-29 MED ORDER — ACETAMINOPHEN ER 650 MG PO TBCR: 650.0000 mg | EXTENDED_RELEASE_TABLET | Freq: Three times a day (TID) | ORAL | 0 refills | Status: AC | PRN

## 2023-05-29 MED ORDER — ACETAMINOPHEN 325 MG PO TABS
650.0000 mg | ORAL_TABLET | Freq: Once | ORAL | Status: DC
  Filled 2023-05-29: qty 2

## 2023-05-29 MED ORDER — NAPROXEN 250 MG PO TABS
500.0000 mg | ORAL_TABLET | Freq: Once | ORAL | Status: DC
  Filled 2023-05-29: qty 2

## 2023-05-29 NOTE — ED Triage Notes (Signed)
 Pt states she has been falling every 2 - 3 days for unknown reasons. Pt is scheduled to see neurology in 1 months. Pt fell this morning hitting the left side of her face. Confirms LOC. Does not take thinners.

## 2023-05-29 NOTE — Discharge Instructions (Addendum)
 You were seen in the emergency room after you had a fall.  The imaging shows no evidence of brain bleed. Given your history of worsening balance issues, we have put in an outpatient referral for neurologist at your request.  Continue to follow-up with your primary care doctor as well. As discussed, you will need someone to look at your medications to ensure they are not contributing to the falls.

## 2023-05-29 NOTE — ED Notes (Signed)
 Pt transported to CT. Will continue orthostatic vitals when pt returns.

## 2023-05-29 NOTE — ED Provider Notes (Signed)
 [place] EMERGENCY DEPARTMENT AT [facility] Provider Note   CSN: [id number] Arrival date & time: 05/29/23  1207     History {Add pertinent medical, surgical, social history, OB history to HPI:1} Chief Complaint  Patient presents with   Colleen Cohen is a 73 y.o. female.  HPI    73 year old female comes in with chief complaint of fall  Home Medications Prior to Admission medications   Medication Sig Start Date End Date Taking? Authorizing Provider  acetaminophen  (TYLENOL  8 HOUR) 650 MG CR tablet Take 1 tablet (650 mg total) by mouth every 8 (eight) hours as needed for pain or fever. 05/29/23  Yes Deatra Face, MD  anastrozole  (ARIMIDEX ) 1 MG tablet TAKE 1 TABLET BY MOUTH EVERY DAY 05/16/23   Paulett Boros, MD  Cholecalciferol 25 MCG (1000 UT) tablet Take by mouth.    [provider]  diazepam  (VALIUM ) 10 MG tablet Take 10 mg by mouth daily.  09/15/15   [provider]  dorzolamide-timolol (COSOPT) 22.3-6.8 MG/ML ophthalmic solution SMARTSIG:In Eye(s) 08/19/20   [provider]  fenofibrate (TRICOR) 145 MG tablet Take 145 mg by mouth daily. 05/20/23   [provider]  FLUoxetine  (PROZAC ) 40 MG capsule Take 40 mg by mouth daily. 05/22/23   [provider]  Fluoxetine  HCl, PMDD, 20 MG TABS Take by mouth.    [provider]  levothyroxine  (SYNTHROID , LEVOTHROID) 112 MCG tablet Take by mouth.    [provider]  metoprolol  succinate (TOPROL -XL) 50 MG 24 hr tablet Take 50 mg by mouth daily. 06/11/20   [provider]  Misc. 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 08/21/20   Katragadda, Sreedhar, MD  omeprazole (PRILOSEC) 20 MG capsule Take 20 mg by mouth every morning. 03/14/23   [provider]  ondansetron  (ZOFRAN -ODT) 4 MG disintegrating tablet Take by mouth. Patient not taking: Reported on 01/11/2023 10/02/20   [provider]  pantoprazole   (PROTONIX ) 40 MG tablet Take 40 mg by mouth every morning.    [provider]  phenazopyridine (PYRIDIUM) 200 MG tablet Take 200 mg by mouth every 8 (eight) hours as needed. 12/04/15   [provider]  tiZANidine (ZANAFLEX) 4 MG tablet Take 4 mg by mouth every 6 (six) hours as needed. 06/26/21   [provider]  valACYclovir (VALTREX) 1000 MG tablet  02/03/18   [provider]      Allergies    Sulfamethoxazole -trimethoprim , Vancomycin , and Codeine    Review of Systems   Review of Systems  Physical Exam Updated Vital Signs BP (!) 180/91   Pulse (!) 59   Temp 97.6 F (36.4 C)   Resp 16   Ht 5' 7.5" (1.715 m)   Wt 116.1 kg   SpO2 95%   BMI 39.50 kg/m  Physical Exam  ED Results / Procedures / Treatments   Labs (all labs ordered are listed, but only abnormal results are displayed) Labs Reviewed  BASIC METABOLIC PANEL WITH GFR  CBC WITH DIFFERENTIAL/PLATELET    EKG EKG Interpretation Date/Time:  Sunday May 29 2023 12:30:52 EDT Ventricular Rate:  66 PR Interval:  147 QRS Duration:  107 QT Interval:  431 QTC Calculation: 452 R Axis:   85  Text Interpretation: Sinus rhythm Borderline right axis deviation No acute changes No significant change since last tracing Confirmed by Deatra Face ([id number]) on 05/29/2023 3:03:55 PM  Radiology CT Head Wo Contrast Result Date: 05/29/2023 CLINICAL DATA:  Head  trauma moderate to severe. Multiple falls. She fell today and struck the left side of her face. EXAM: CT HEAD WITHOUT CONTRAST TECHNIQUE: Contiguous axial images were obtained from the base of the skull through the vertex without intravenous contrast. RADIATION DOSE REDUCTION: This exam was performed according to the departmental dose-optimization program which includes automated exposure control, adjustment of the mA and/or kV according to patient size and/or use of iterative reconstruction technique. COMPARISON:  None Available. FINDINGS: Brain: No acute  infarct, hemorrhage, or mass lesion is present. No significant white matter lesions are present. The ventricles are of normal size. No significant extraaxial fluid collection is present. Mild generalized atrophy is present with some prominence of the extra-axial spaces. The brainstem and cerebellum are within normal limits. Midline structures are within normal limits. Vascular: No hyperdense vessel or unexpected calcification. Skull: Calvarium is intact. No focal lytic or blastic lesions are present. No significant extracranial soft tissue lesion is present. Sinuses/Orbits: The paranasal sinuses and mastoid air cells are clear. Bilateral lens replacements are noted. Globes and orbits are otherwise unremarkable. IMPRESSION: 1. No acute intracranial abnormality or significant white matter disease. 2. Mild generalized atrophy. Electronically Signed   By: Audree Leas M.D.   On: 05/29/2023 14:41    Procedures Procedures  {Document cardiac monitor, telemetry assessment procedure when appropriate:1}  Medications Ordered in ED Medications  acetaminophen  (TYLENOL ) tablet 650 mg (has no administration in time range)  naproxen  (NAPROSYN ) tablet 500 mg (has no administration in time range)    ED Course/ Medical Decision Making/ A&P   {   Click here for ABCD2, HEART and other calculatorsREFRESH Note before signing :1}                              Medical Decision Making Amount and/or Complexity of Data Reviewed Labs: ordered. Radiology: ordered.  Risk OTC drugs. Prescription drug management.   ***  {Document critical care time when appropriate:1} {Document review of labs and clinical decision tools ie heart score, Chads2Vasc2 etc:1}  {Document your independent review of radiology images, and any outside records:1} {Document your discussion with family members, caretakers, and with consultants:1} {Document social determinants of health affecting pt's care:1} {Document your decision  making why or why not admission, treatments were needed:1} Final Clinical Impression(s) / ED Diagnoses Final diagnoses:  Fall, initial encounter  Balance problem    Rx / DC Orders ED Discharge Orders          Ordered    Ambulatory referral to Neurology       Comments: An appointment is requested in approximately: 2 weeks   05/29/23 1541    acetaminophen  (TYLENOL  8 HOUR) 650 MG CR tablet  Every 8 hours PRN        05/29/23 1542

## 2023-07-04 ENCOUNTER — Other Ambulatory Visit (HOSPITAL_COMMUNITY): Payer: Self-pay | Admitting: Neurology

## 2023-07-04 DIAGNOSIS — I639 Cerebral infarction, unspecified: Secondary | ICD-10-CM

## 2023-07-09 ENCOUNTER — Ambulatory Visit (HOSPITAL_COMMUNITY)
Admission: RE | Admit: 2023-07-09 | Discharge: 2023-07-09 | Disposition: A | Discharge status: Home / Self Care | Source: Ambulatory Visit | Attending: Neurology | Admitting: Neurology

## 2023-07-09 DIAGNOSIS — I639 Cerebral infarction, unspecified: Secondary | ICD-10-CM | POA: Diagnosis present

## 2023-11-11 ENCOUNTER — Ambulatory Visit: Admitting: Cardiology

## 2023-12-06 ENCOUNTER — Ambulatory Visit: Admitting: Cardiology

## 2023-12-26 ENCOUNTER — Other Ambulatory Visit (HOSPITAL_COMMUNITY): Payer: Self-pay | Admitting: Oncology

## 2023-12-26 DIAGNOSIS — Z1231 Encounter for screening mammogram for malignant neoplasm of breast: Secondary | ICD-10-CM

## 2023-12-29 ENCOUNTER — Ambulatory Visit (HOSPITAL_COMMUNITY): Payer: Medicare Other

## 2023-12-29 ENCOUNTER — Inpatient Hospital Stay: Payer: Medicare Other

## 2024-01-09 ENCOUNTER — Inpatient Hospital Stay: Payer: Medicare Other | Admitting: Physician Assistant

## 2024-01-16 ENCOUNTER — Ambulatory Visit: Payer: Medicare Other | Admitting: Hematology

## 2024-02-06 ENCOUNTER — Ambulatory Visit (HOSPITAL_COMMUNITY)

## 2024-03-21 ENCOUNTER — Inpatient Hospital Stay

## 2024-03-28 ENCOUNTER — Inpatient Hospital Stay: Admitting: Physician Assistant
